# Patient Record
Sex: Male | Born: 1935 | Race: Black or African American | Hispanic: No | Smoking: Former smoker
Health system: Southern US, Community
[De-identification: ages and names within clinical notes are randomized; demographics above are authoritative.]

## PROBLEM LIST (undated history)

## (undated) DIAGNOSIS — I10 Essential (primary) hypertension: Secondary | ICD-10-CM

## (undated) DIAGNOSIS — I4510 Unspecified right bundle-branch block: Secondary | ICD-10-CM

## (undated) DIAGNOSIS — E785 Hyperlipidemia, unspecified: Secondary | ICD-10-CM

## (undated) DIAGNOSIS — M1990 Unspecified osteoarthritis, unspecified site: Secondary | ICD-10-CM

## (undated) DIAGNOSIS — M109 Gout, unspecified: Secondary | ICD-10-CM

## (undated) DIAGNOSIS — K219 Gastro-esophageal reflux disease without esophagitis: Secondary | ICD-10-CM

## (undated) DIAGNOSIS — N183 Chronic kidney disease, stage 3 unspecified: Secondary | ICD-10-CM

## (undated) HISTORY — PX: NO PAST SURGERIES: SHX2092

## (undated) HISTORY — DX: Unspecified osteoarthritis, unspecified site: M19.90

## (undated) HISTORY — DX: Chronic kidney disease, stage 3 unspecified: N18.30

## (undated) HISTORY — DX: Gout, unspecified: M10.9

## (undated) HISTORY — DX: Hyperlipidemia, unspecified: E78.5

## (undated) HISTORY — DX: Chronic kidney disease, stage 3 (moderate): N18.3

## (undated) HISTORY — DX: Unspecified right bundle-branch block: I45.10

---

## 2007-03-08 ENCOUNTER — Ambulatory Visit (HOSPITAL_COMMUNITY): Admission: RE | Admit: 2007-03-08 | Discharge: 2007-03-08 | Payer: Self-pay | Admitting: Gastroenterology

## 2007-03-27 ENCOUNTER — Ambulatory Visit: Payer: Self-pay | Admitting: Gastroenterology

## 2007-03-27 ENCOUNTER — Ambulatory Visit (HOSPITAL_COMMUNITY): Admission: RE | Admit: 2007-03-27 | Discharge: 2007-03-27 | Payer: Self-pay | Admitting: Gastroenterology

## 2007-11-15 ENCOUNTER — Emergency Department (HOSPITAL_COMMUNITY): Admission: EM | Admit: 2007-11-15 | Discharge: 2007-11-15 | Payer: Self-pay | Admitting: Emergency Medicine

## 2008-05-04 ENCOUNTER — Ambulatory Visit: Payer: Self-pay | Admitting: Cardiology

## 2008-05-04 ENCOUNTER — Inpatient Hospital Stay (HOSPITAL_COMMUNITY): Admission: EM | Admit: 2008-05-04 | Discharge: 2008-05-05 | Payer: Self-pay | Admitting: Emergency Medicine

## 2008-05-05 ENCOUNTER — Encounter: Payer: Self-pay | Admitting: Cardiology

## 2009-09-02 ENCOUNTER — Emergency Department (HOSPITAL_COMMUNITY): Admission: EM | Admit: 2009-09-02 | Discharge: 2009-09-02 | Payer: Self-pay | Admitting: Emergency Medicine

## 2010-04-27 LAB — LIPID PANEL
Cholesterol: 202 mg/dL — ABNORMAL HIGH (ref 0–200)
HDL: 25 mg/dL — ABNORMAL LOW (ref >39)
Total CHOL/HDL Ratio: 8.1 RATIO
VLDL: 47 mg/dL — ABNORMAL HIGH (ref 0–40)

## 2010-04-27 LAB — DIFFERENTIAL
Basophils Absolute: 0.1 10*3/uL (ref 0.0–0.1)
Basophils Relative: 1 % (ref 0–1)
Eosinophils Absolute: 0.1 10*3/uL (ref 0.0–0.7)
Eosinophils Relative: 3 % (ref 0–5)
Lymphocytes Relative: 34 % (ref 12–46)
Lymphs Abs: 1.7 10*3/uL (ref 0.7–4.0)
Monocytes Absolute: 0.2 10*3/uL (ref 0.1–1.0)

## 2010-04-27 LAB — POCT CARDIAC MARKERS
CKMB, poc: 1.8 ng/mL (ref 1.0–8.0)
CKMB, poc: 1.9 ng/mL (ref 1.0–8.0)
Myoglobin, poc: 164 ng/mL (ref 12–200)
Myoglobin, poc: 190 ng/mL (ref 12–200)

## 2010-04-27 LAB — COMPREHENSIVE METABOLIC PANEL
AST: 23 U/L (ref 0–37)
Albumin: 3.9 g/dL (ref 3.5–5.2)
Alkaline Phosphatase: 62 U/L (ref 39–117)
Creatinine, Ser: 1.42 mg/dL (ref 0.4–1.5)
GFR calc Af Amer: 60 mL/min — ABNORMAL LOW (ref >60)

## 2010-04-27 LAB — CBC
HCT: 39.4 % (ref 39.0–52.0)
Hemoglobin: 13.7 g/dL (ref 13.0–17.0)
RBC: 4.62 MIL/uL (ref 4.22–5.81)
WBC: 4.9 10*3/uL (ref 4.0–10.5)

## 2010-04-27 LAB — CARDIAC PANEL(CRET KIN+CKTOT+MB+TROPI)
CK, MB: 3.5 ng/mL (ref 0.3–4.0)
Relative Index: 1.7 (ref 0.0–2.5)
Total CK: 174 U/L (ref 7–232)

## 2010-05-31 NOTE — Consult Note (Signed)
NAME:  Scott Burton, Scott Burton NO.:  [id number]   MEDICAL RECORD NO.:  [id number]          PATIENT TYPE:  INP   LOCATION:  A301                          FACILITY:  [facility]   PHYSICIAN:  Gerrit Friends. Dietrich Pates, MD, FACCDATE OF BIRTH:  Jun 03, 1935   DATE OF CONSULTATION:  05/05/2008  DATE OF DISCHARGE:                                 CONSULTATION   REFERRING PHYSICIAN:  Dr. Avon Gully.   REASON FOR CONSULTATION:  Chest pain.   HISTORY OF PRESENT ILLNESS:  Scott Burton is a 75 year old male patient  with no history of CAD who presented to the [facility]  Emergency Room yesterday with complaints of chest discomfort.  Scott Burton  started to note left-sided chest pressure about 3 days ago.  It is  sometimes brought on at mealtime and improved with taking antacids.  Scott Burton  works at a [employer].  Scott Burton had  recurrent chest discomfort yesterday with lifting a bag of groceries.  It lasted for a few minutes and his coworkers eventually urged him to go  to the emergency room for further evaluation.  Scott Burton denies any associated  shortness of breath, radiating symptoms, nausea, diaphoresis, or  syncope.  Scott Burton is very active without any recent history of exertional  chest pain or dyspnea with exertion.   PAST MEDICAL HISTORY:  1. Hypertension.  2. GERD.  3. Questionable history of hyperlipidemia.  4. Diverticulosis.   MEDICATIONS AT HOME:  1. Omeprazole 20 mg daily.  2. Lisinopril/hydrochlorothiazide 10/12.5 mg daily.   ALLERGIES:  NO KNOWN DRUG ALLERGIES.   SOCIAL HISTORY:  The patient lives in [place] with his 2 daughters.  Scott Burton  is a widower.  Scott Burton has a prior history of smoking off and on and quit  about 20 years ago.  Denies alcohol abuse.  Scott Burton works at [employer]  as outlined above about 32 hours a week.   FAMILY HISTORY:  His brother had some type of heart surgery in his 30s  and died after that.  It is not clear that Scott Burton had bypass  surgery.   REVIEW OF SYSTEMS:  Please see the HPI.  Scott Burton denies fevers, chills,  headache, sore throat, rash, dysuria, hematuria, bright red blood per  rectum or melena, dysphagia, GERD symptoms, claudication symptoms,  orthopnea, PND or pedal edema, syncope, near syncope, or cough.  All  other systems reviewed and negative.   PHYSICAL EXAM:  Scott Burton is a well-nourished, well-developed male in no acute  distress.  Blood pressure is 121/68.  Pulse 65.  Respirations 18.  Temperature 98.3.  Oxygen saturation is 96% on room air.  Weight 88.1  kg.  HEENT:  Normal.  NECK:  Without JVD.  LYMPH:  Without lymphadenopathy.  ENDOCRINE:  Without thyromegaly.  CARDIAC:  Normal S1 and S2.  Regular rate and rhythm.  A 2/6 systolic  ejection murmur heard best at right sternal border and left sternal  border with preserved S2.  LUNGS:  Clear to auscultation bilaterally.  No wheezing, rhonchi, or  rales.  SKIN:  Warm and dry.  ABDOMEN:  Soft, nontender with normoactive bowel sounds.  No  organomegaly.  EXTREMITIES:  Without clubbing, cyanosis, or edema.  MUSCULOSKELETAL:  Without joint deformity.  NEUROLOGIC:  Scott Burton is alert and oriented x3.  Cranial nerves II-XII are  grossly intact.  VASCULAR EXAM:  Without carotid bruits bilaterally.  Dorsalis pedis and  posterior tibialis pulses are 2+ bilaterally.   Chest x-ray, no active cardiopulmonary disease.  EKG, sinus rhythm with  a heart rate of 82, left axis deviation, left anterior fascicular block,  left atrial enlargement, nonspecific ST-T wave abnormality laterally,  borderline IVCD, septal Q-waves.   LABS:  Hemoglobin 13.7, potassium 3.4, creatinine 1.42.  Cardiac markers  negative x3.  INR 1.0.   ASSESSMENT:  1. Atypical chest pain in a 75 year old male with a history of      hypertension and questionable history of hyperlipidemia, as well as      remote tobacco abuse.  Myocardial infarction has been ruled out by      enzymes.  Followup EKG is  pending.  His symptoms are atypical and      likely secondary to gastroesophageal reflux disease or      musculoskeletal pain.  2. Gastroesophageal reflux disease.  3. Questionable family history of coronary artery disease.  4. Systolic murmur.  5. Hypokalemia.   RECOMMENDATIONS:  The patient was also interviewed and examined by Dr.  Hamlin Bing.  Prior records have been reviewed.  As noted, Scott Burton does  have atypical chest pain.  Scott Burton does have some characteristics of ischemia  with positive risk factors.  His EKG demonstrates nondiagnostic  abnormalities.  His cardiac markers are negative.  Scott Burton is relatively low  risk for coronary artery disease.  We will assess with an echocardiogram  to look at his cardiac structure and assess his murmur.  Scott Burton will also  undergo a stress echocardiogram.  If it can be done as an inpatient  today, we will proceed today.  Otherwise, it can be done as an  outpatient.  Lipids will be checked.  His potassium will be replaced.  Thank you very much for the consultation.  We will be glad to follow the  patient throughout the remainder of this admission.      Tereso Newcomer, PA-C      Gerrit Friends. Dietrich Pates, MD, [facility]  Electronically Signed    SW/MEDQ  D:  05/05/2008  T:  05/05/2008  Job:  [id number]   cc:   Tesfaye D. Felecia Shelling, MD  Fax: [phone]

## 2010-05-31 NOTE — Op Note (Signed)
NAME:  Scott Burton, Scott Burton NO.:  [id number]   MEDICAL RECORD NO.:  [id number]          PATIENT TYPE:  AMB   LOCATION:  DAY                           FACILITY:  [facility]   PHYSICIAN:  Kassie Mends, M.D.      DATE OF BIRTH:  1935-08-21   DATE OF PROCEDURE:  03/27/2007  DATE OF DISCHARGE:                               OPERATIVE REPORT   REFERRING Luetta Piazza:  Tesfaye D. Felecia Shelling, MD   PROCEDURE:  Colonoscopy.   INDICATION FOR EXAM:  Scott Burton is a 75 year old male who presents for  average risk colon cancer screening.   FINDINGS:  1. Frequent sigmoid diverticula.  Otherwise, no polyps, masses,      inflammatory changes or AVMs seen.  2. Small internal hemorrhoids.  Otherwise normal retroflexed view of      the rectum.   RECOMMENDATIONS:  1. Screening colonoscopy in 10 years.  2. He should follow a high-fiber diet.  He was given a handout on high-      fiber diet and diverticulosis.   MEDICATIONS:  1. Demerol 50 mg IV.  2. Versed 4 mg IV.   PROCEDURE TECHNIQUE:  Physical exam was performed.  Informed consent was  obtained.  The patient was explained the benefits, risks and  alternatives to the procedure.  The patient was connected to the monitor  and placed in the left lateral position.  Continuous oxygen was provided  by nasal cannula.  IV meds were administered through an indwelling  cannula.  After administration of sedation and after rectal exam, the  patient's rectum was intubated  and the scope was advanced under direct visualization to the cecum.  The  scope was removed slowly by carefully examining the color, texture,  anatomy and integrity of the mucosa on the way out.  The patient was  recovered in endoscopy and discharged home in satisfactory condition.      Kassie Mends, M.D.  Electronically Signed     SM/MEDQ  D:  03/27/2007  T:  03/27/2007  Job:  [id number]   cc:   Tesfaye D. Felecia Shelling, MD  Fax: [phone]

## 2010-05-31 NOTE — H&P (Signed)
NAME:  JONNIE, TRUXILLO NO.:  [id number]   MEDICAL RECORD NO.:  [id number]          PATIENT TYPE:  INP   LOCATION:  A301                          FACILITY:  [facility]   PHYSICIAN:  Tesfaye D. Felecia Shelling, MD   DATE OF BIRTH:  1935-11-03   DATE OF ADMISSION:  05/04/2008  DATE OF DISCHARGE:  LH                              HISTORY & PHYSICAL   CHIEF COMPLAINT:  Chest pain.   HISTORY OF PRESENT ILLNESS:  This is a 75 year old male patient with  history of hypertension and gastroesophageal reflux disease, came with a  complaint of left-sided recurrent chest pain.  The pain started about 2-  3 days back.  It lasted for a few minutes.  He had another episode this  morning and the pain was like localized and pressure-type.  There was no  radiation.  No nausea or vomiting.  The patient came to emergency room  and he was evaluated.  His initial cardiac enzymes and EKG were negative  for any acute ischemic changes.  The patient was, however, admitted for  further evaluation and treatment.   REVIEW OF SYSTEMS:  No fever or chills, headache, shortness of breath,  palpitation, nausea, vomiting, abdominal pain, dysuria, urgency or  frequency of urination.   PAST MEDICAL HISTORY:  1. Gastroesophageal reflux disease.  2. Hypertension.   CURRENT MEDICATIONS:  1. Protonix 40 mg daily.  2. Lisinopril/HCTZ 10/12.5 one tablet p.o. daily.   SOCIAL HISTORY:  Patient has no history of alcohol, tobacco or substance  abuse.   PHYSICAL EXAMINATION:  The patient is alert, awake and comfortable.  VITAL SIGNS:  Blood pressure 136/82, pulse 87, respiratory rate 18,  temperature 98 degrees Fahrenheit.  HEENT:  Pupils are equal and reactive.  NECK:  Supple.  CHEST:  Decreased air entry, few rhonchi.  CARDIOVASCULAR SYSTEM:  First and second heart sound heard.  No murmur,  no gallop.  ABDOMEN:  Soft and lax.  Bowel sound is positive.  No mass or  organomegaly.  EXTREMITIES:  No leg edema.   LABORATORY DATA:  Sodium 138, potassium 3.4, chloride 104, carbon  dioxide 27, glucose 93, BUN 14, creatinine 1.4 and calcium 10.0.  CBC:  WBC 4.9, hemoglobin 13.7, hematocrit 39.4 and platelets 181.  Myoglobin  164, troponin less than 0.05, CK-MB 1.9.   ASSESSMENT:  This is a 75 year old male patient with a history of  hypertension and gastroesophageal reflux disease, came with left-sided  chest pain.  His initial EKG and cardiac enzyme is negative for acute  ischemia.   PLAN:  We will do serial EKG, cardiac enzymes.  We will do cardiology  consult.  We will continue the patient on his regular medications.      Tesfaye D. Felecia Shelling, MD  Electronically Signed     TDF/MEDQ  D:  05/04/2008  T:  05/05/2008  Job:  [id number]

## 2010-06-03 NOTE — Discharge Summary (Signed)
NAME:  Scott Burton, Scott Burton NO.:  [id number]   MEDICAL RECORD NO.:  [id number]          PATIENT TYPE:  INP   LOCATION:  A301                          FACILITY:  [facility]   PHYSICIAN:  Tesfaye D. Felecia Shelling, MD   DATE OF BIRTH:  1935-06-19   DATE OF ADMISSION:  05/04/2008  DATE OF DISCHARGE:  04/20/2010LH                               DISCHARGE SUMMARY   DISCHARGE DIAGNOSES:  1. Noncardiac chest pain.  2. Gastroesophageal reflux disease.  3. Hypokalemia.  4. History of hyperlipidemia.  5. Diverticulitis.   DISCHARGE MEDICATIONS:  1. Lisinopril/hydrochlorothiazide 10/12.5 one tablet p.o. daily.  2. Omeprazole 20 mg daily.   DISPOSITION:  The patient was discharged to home in stable condition.   HOSPITAL COURSE:  This is a 75 year old male patient with history of  hypertension and gastroesophageal reflux disease, who was brought to  emergency room due to left-sided chest pain.  The pain started about 2  or 3 days before admission.  It lasted for few minutes.  There was no  associated symptoms.  The patient was admitted and had a serial EKG and  cardiac enzymes.  The test was overall negative.  He was also evaluated  by Cardiology and stress test was done, which was found to be normal.  The patient was discharged to home in stable condition to continue  followup in outpatient.      Tesfaye D. Felecia Shelling, MD  Electronically Signed     TDF/MEDQ  D:  05/14/2008  T:  05/14/2008  Job:  [id number]

## 2010-10-18 LAB — DIFFERENTIAL
Basophils Absolute: 0.1
Eosinophils Relative: 3
Lymphs Abs: 2.1
Monocytes Absolute: 0.4
Neutrophils Relative %: 65

## 2010-10-18 LAB — URINALYSIS, ROUTINE W REFLEX MICROSCOPIC
Bilirubin Urine: NEGATIVE
Glucose, UA: NEGATIVE
Hgb urine dipstick: NEGATIVE
Ketones, ur: NEGATIVE
Nitrite: NEGATIVE
Protein, ur: NEGATIVE
Specific Gravity, Urine: >1.03 — ABNORMAL HIGH
Urobilinogen, UA: 0.2
pH: 6

## 2010-10-18 LAB — COMPREHENSIVE METABOLIC PANEL WITH GFR
ALT: 14
AST: 18
Albumin: 3.8
Alkaline Phosphatase: 66
BUN: 14
CO2: 26
Calcium: 10
Chloride: 110
Creatinine, Ser: 1.43
GFR calc Af Amer: 59 — ABNORMAL LOW
GFR calc non Af Amer: 49 — ABNORMAL LOW
Glucose, Bld: 105 — ABNORMAL HIGH
Potassium: 3.9
Sodium: 140
Total Bilirubin: 0.6
Total Protein: 6.8

## 2010-10-18 LAB — URINE MICROSCOPIC-ADD ON

## 2010-10-18 LAB — CBC
Hemoglobin: 13.6
MCHC: 33.7
MCV: 85.9
Platelets: 200
RBC: 4.69

## 2010-10-18 LAB — B-NATRIURETIC PEPTIDE (CONVERTED LAB): Pro B Natriuretic peptide (BNP): 44.9

## 2011-01-28 ENCOUNTER — Encounter (HOSPITAL_COMMUNITY): Payer: Self-pay | Admitting: Emergency Medicine

## 2011-01-28 ENCOUNTER — Emergency Department (HOSPITAL_COMMUNITY)
Admission: EM | Admit: 2011-01-28 | Discharge: 2011-01-28 | Disposition: A | Discharge status: Home / Self Care | Payer: Medicare Other | Attending: Emergency Medicine | Admitting: Emergency Medicine

## 2011-01-28 DIAGNOSIS — H6091 Unspecified otitis externa, right ear: Secondary | ICD-10-CM

## 2011-01-28 DIAGNOSIS — I10 Essential (primary) hypertension: Secondary | ICD-10-CM | POA: Insufficient documentation

## 2011-01-28 DIAGNOSIS — K219 Gastro-esophageal reflux disease without esophagitis: Secondary | ICD-10-CM | POA: Insufficient documentation

## 2011-01-28 DIAGNOSIS — H938X9 Other specified disorders of ear, unspecified ear: Secondary | ICD-10-CM | POA: Insufficient documentation

## 2011-01-28 DIAGNOSIS — H9210 Otorrhea, unspecified ear: Secondary | ICD-10-CM | POA: Insufficient documentation

## 2011-01-28 DIAGNOSIS — H9209 Otalgia, unspecified ear: Secondary | ICD-10-CM | POA: Insufficient documentation

## 2011-01-28 DIAGNOSIS — H9319 Tinnitus, unspecified ear: Secondary | ICD-10-CM | POA: Insufficient documentation

## 2011-01-28 DIAGNOSIS — H60399 Other infective otitis externa, unspecified ear: Secondary | ICD-10-CM | POA: Insufficient documentation

## 2011-01-28 HISTORY — DX: Essential (primary) hypertension: I10

## 2011-01-28 HISTORY — DX: Gastro-esophageal reflux disease without esophagitis: K21.9

## 2011-01-28 MED ORDER — OFLOXACIN 0.3 % OT SOLN: 3.0000 [drp] | Freq: Two times a day (BID) | OTIC | Status: AC

## 2011-01-28 NOTE — ED Provider Notes (Signed)
History    75yM with R ear pain and drainage. Gradual onset about a week ago. Persistent. Yellowish drainage. Past few days has had sensation of ear fullness and tinnitus. Feels buzzing sensation in ear. No neck pain or sore throat. No fever or chills. No Ha. Denies use of cotton swabs to clean ears. Denies hx of diabetes.  CSN: [id number]  Arrival date & time 01/28/11  1325   First MD Initiated Contact with Patient 01/28/11 1506      Chief Complaint  Patient presents with  . Otalgia  . Tinnitus    (Consider location/radiation/quality/duration/timing/severity/associated sxs/prior treatment) HPI  Past Medical History  Diagnosis Date  . Hypertension   . GERD (gastroesophageal reflux disease)     History reviewed. No pertinent past surgical history.  Family History  Problem Relation Age of Onset  . Cancer Father     History  Substance Use Topics  . Smoking status: Never Smoker   . Smokeless tobacco: Former Neurosurgeon    Quit date: 01/28/1991  . Alcohol Use: No      Review of Systems   Review of symptoms negative unless otherwise noted in HPI.   Allergies  Review of patient's allergies indicates no known allergies.  Home Medications  No current outpatient prescriptions on file.  BP 118/69  Pulse 87  Temp(Src) 98.5 F (36.9 C) (Oral)  Resp 20  Ht 5\' 6"  (1.676 m)  Wt 180 lb (81.647 kg)  BMI 29.05 kg/m2  SpO2 100%  Physical Exam  Nursing note and vitals reviewed. Constitutional: He appears well-developed and well-nourished. No distress.  HENT:  Head: Normocephalic and atraumatic.  Left Ear: External ear normal.  Nose: Nose normal.  Mouth/Throat: Oropharynx is clear and moist. No oropharyngeal exudate.       Yellowish discharge from R ear. Some pain with manipulation. Mild swelling of ext aud canal but not significant enough to need ear wick. TM only partially visualized because of debris. Visualized portion intact. Mastoid region non tender and no significant  overlying skin changes.  Eyes: Conjunctivae are normal. Pupils are equal, round, and reactive to light. Right eye exhibits no discharge. Left eye exhibits no discharge.  Neck: Neck supple.  Cardiovascular: Normal rate, regular rhythm and normal heart sounds.  Exam reveals no gallop and no friction rub.   No murmur heard. Pulmonary/Chest: Effort normal and breath sounds normal. No respiratory distress.  Abdominal: Soft.  Musculoskeletal: He exhibits no edema and no tenderness.  Neurological: He is alert.  Skin: Skin is warm and dry.  Psychiatric: He has a normal mood and affect. His behavior is normal. Thought content normal.    ED Course  Procedures (including critical care time)  Labs Reviewed - No data to display No results found.   1. Otitis externa of right ear       MDM  75ym with R ear pain and drainage. Exam consistent with otitis externa. No hx of diabetes. No mastoid tenderness or skin changes. Exam otherwise unremarkable. Plan tx for otitis externa. Outpt fu.        Raeford Razor, MD 01/28/11 1527

## 2011-01-28 NOTE — ED Notes (Signed)
Patient c/o right ear pain with ringing in ear x1 week. Per patient occasional yellow drainage noted.

## 2011-05-04 ENCOUNTER — Ambulatory Visit (INDEPENDENT_AMBULATORY_CARE_PROVIDER_SITE_OTHER): Payer: Medicare Other | Admitting: Otolaryngology

## 2011-05-04 DIAGNOSIS — H9319 Tinnitus, unspecified ear: Secondary | ICD-10-CM

## 2011-05-04 DIAGNOSIS — H903 Sensorineural hearing loss, bilateral: Secondary | ICD-10-CM

## 2012-05-02 ENCOUNTER — Ambulatory Visit (INDEPENDENT_AMBULATORY_CARE_PROVIDER_SITE_OTHER): Payer: Medicare Other | Admitting: Otolaryngology

## 2012-08-17 ENCOUNTER — Emergency Department (HOSPITAL_COMMUNITY)
Admission: EM | Admit: 2012-08-17 | Discharge: 2012-08-17 | Disposition: A | Discharge status: Home / Self Care | Payer: Medicare Other | Attending: Emergency Medicine | Admitting: Emergency Medicine

## 2012-08-17 ENCOUNTER — Encounter (HOSPITAL_COMMUNITY): Payer: Self-pay | Admitting: Emergency Medicine

## 2012-08-17 DIAGNOSIS — L539 Erythematous condition, unspecified: Secondary | ICD-10-CM | POA: Insufficient documentation

## 2012-08-17 DIAGNOSIS — Y929 Unspecified place or not applicable: Secondary | ICD-10-CM | POA: Insufficient documentation

## 2012-08-17 DIAGNOSIS — K219 Gastro-esophageal reflux disease without esophagitis: Secondary | ICD-10-CM | POA: Insufficient documentation

## 2012-08-17 DIAGNOSIS — Y939 Activity, unspecified: Secondary | ICD-10-CM | POA: Insufficient documentation

## 2012-08-17 DIAGNOSIS — I10 Essential (primary) hypertension: Secondary | ICD-10-CM | POA: Insufficient documentation

## 2012-08-17 DIAGNOSIS — T63461A Toxic effect of venom of wasps, accidental (unintentional), initial encounter: Secondary | ICD-10-CM | POA: Insufficient documentation

## 2012-08-17 DIAGNOSIS — H5710 Ocular pain, unspecified eye: Secondary | ICD-10-CM | POA: Insufficient documentation

## 2012-08-17 DIAGNOSIS — T6391XA Toxic effect of contact with unspecified venomous animal, accidental (unintentional), initial encounter: Secondary | ICD-10-CM | POA: Insufficient documentation

## 2012-08-17 DIAGNOSIS — R220 Localized swelling, mass and lump, head: Secondary | ICD-10-CM | POA: Insufficient documentation

## 2012-08-17 MED ORDER — DIPHENHYDRAMINE HCL 25 MG PO CAPS
25.0000 mg | ORAL_CAPSULE | Freq: Once | ORAL | Status: AC
  Administered 2012-08-17: 25 mg via ORAL
  Filled 2012-08-17: qty 1

## 2012-08-17 MED ORDER — DIPHENHYDRAMINE HCL 25 MG PO TABS: 25.0000 mg | ORAL_TABLET | Freq: Three times a day (TID) | ORAL | Status: DC | PRN

## 2012-08-17 NOTE — ED Provider Notes (Signed)
CSN: [id number]     Arrival date & time 08/17/12  1632 History     First MD Initiated Contact with Patient 08/17/12 1649     Chief Complaint  Patient presents with  . Insect Bite   (Consider location/radiation/quality/duration/timing/severity/associated sxs/prior Treatment) HPI Pt was stung by bees multiple times this morning around 8 am. Pt was stung on R face, R posterior neck and elbows. He has very mild pain at sites, complains of itching at sites. Pt has mild R periorbital swelling. No intraoral swelling. No stridor or wheezing. No SOB. No rash, nausea or vomiting.  Past Medical History  Diagnosis Date  . Hypertension   . GERD (gastroesophageal reflux disease)    History reviewed. No pertinent past surgical history. Family History  Problem Relation Age of Onset  . Cancer Father    History  Substance Use Topics  . Smoking status: Never Smoker   . Smokeless tobacco: Former Neurosurgeon    Quit date: 01/28/1991  . Alcohol Use: No    Review of Systems  Constitutional: Negative for fever and chills.  HENT: Positive for facial swelling. Negative for sore throat, trouble swallowing and neck pain.   Respiratory: Negative for cough, shortness of breath, wheezing and stridor.   Cardiovascular: Negative for chest pain, palpitations and leg swelling.  Gastrointestinal: Negative for nausea, vomiting, abdominal pain, diarrhea and constipation.  Musculoskeletal: Negative for back pain.  Skin: Negative for pallor, rash and wound.  Neurological: Negative for dizziness, weakness, light-headedness, numbness and headaches.  All other systems reviewed and are negative.    Allergies  Review of patient's allergies indicates no known allergies.  Home Medications   Current Outpatient Rx  Name  Route  Sig  Dispense  Refill  . diphenhydrAMINE (BENADRYL) 25 MG tablet   Oral   Take 1 tablet (25 mg total) by mouth every 8 (eight) hours as needed for itching.   20 tablet   0    BP 98/70   Pulse 80  Temp(Src) 98.4 F (36.9 C) (Oral)  Resp 18  Ht 5\' 6"  (1.676 m)  Wt 189 lb (85.73 kg)  BMI 30.52 kg/m2  SpO2 99% Physical Exam  Nursing note and vitals reviewed. Constitutional: He is oriented to person, place, and time. He appears well-developed and well-nourished. No distress.  HENT:  Head: Normocephalic and atraumatic.  Mouth/Throat: Oropharynx is clear and moist.  Very mild R periorbital swelling  Eyes: EOM are normal. Pupils are equal, round, and reactive to light.  Neck: Normal range of motion. Neck supple.  Cardiovascular: Normal rate and regular rhythm.   Pulmonary/Chest: Effort normal and breath sounds normal. No stridor. No respiratory distress. He has no wheezes. He has no rales.  Abdominal: Soft. Bowel sounds are normal. He exhibits no distension and no mass. There is no tenderness. There is no rebound and no guarding.  Musculoskeletal: Normal range of motion. He exhibits no edema and no tenderness.  Neurological: He is alert and oriented to person, place, and time.  Skin: Skin is warm and dry. No rash noted. There is erythema.  Small non-raised erythematous areas on R face, poster neck and arms consistent with insect sting  Psychiatric: He has a normal mood and affect. His behavior is normal.    ED Course   Procedures (including critical care time)  Labs Reviewed - No data to display No results found. 1. Bee sting, initial encounter     MDM  Well appearing. No anaphylaxis. Will treat with benadryl. Return  precautions given.   Loren Racer, MD 08/17/12 [phone]

## 2012-08-17 NOTE — ED Notes (Signed)
Pt c/o multiple bee stings-right eye, bilateral elbows, back of head and abdomen. Pt c/o pain/swelling to areas-worse in right eye.

## 2012-08-17 NOTE — ED Notes (Signed)
C/o multiple stings by yellow jackets that occurred this morning at 0800 to face and neck, denies any SOB or difficulty swallowing, took some unknown brown pill earlier and was told that would keep him from itching, no acute distress noted at this time

## 2013-04-15 ENCOUNTER — Telehealth: Payer: Self-pay | Admitting: *Deleted

## 2013-04-15 NOTE — Telephone Encounter (Signed)
Pt's daughter "Pamala Hurry" called to schedule pt a colonoscopy, Pamala Hurry stated they got a letter in the mail stating he needs to schedule a colonoscopy. Please advise [phone]

## 2013-04-16 ENCOUNTER — Telehealth: Payer: Self-pay

## 2013-04-16 NOTE — Telephone Encounter (Signed)
Pt's daughter, Pamala Hurry, has called back again today to see about getting her father set up for a tcs. I told her that DS was aware and would be calling her back. She gave another number to call [phone]

## 2013-04-16 NOTE — Telephone Encounter (Addendum)
REVIEWED. AGREE. NEXT TCS IN 2019 IF THE BENEFITS OUTWEIGH THE RISKS. PLEASE INSTRUCT PT TO CALL FOR TCS SOONER IF HE HAS LOW BLOOD COUNT, rectal bleedING OR CHANGE IN HIS BOWEL HABITS. HE WOULD NEED A TCS SOONER THAN 2019.

## 2013-04-16 NOTE — Telephone Encounter (Signed)
I called [phone] and spoke to Urbano Heir one of the daughters and also called Pamala Hurry at [phone]. Both of them said their father is not having any problems, no rectal bleed, no hemorrhoids, no constipation or diarrhea. He has no family hx of colon cancer.   His last colonoscopy was done 03/27/2007 by Dr.Manus Oneida Alar). She recommended his next one be done in 10 years. He will be due next in 03/2017.  They are aware and will call if any problems. He is on our recall for 2019.

## 2013-04-17 NOTE — Telephone Encounter (Signed)
Daughters are aware pt will need TCS sooner if he has problems with rectal bleed or anemia.

## 2013-04-17 NOTE — Telephone Encounter (Signed)
See separate phone note.

## 2014-02-04 DIAGNOSIS — K219 Gastro-esophageal reflux disease without esophagitis: Secondary | ICD-10-CM | POA: Diagnosis not present

## 2014-02-04 DIAGNOSIS — Z0001 Encounter for general adult medical examination with abnormal findings: Secondary | ICD-10-CM | POA: Diagnosis not present

## 2014-02-04 DIAGNOSIS — I10 Essential (primary) hypertension: Secondary | ICD-10-CM | POA: Diagnosis not present

## 2014-02-04 DIAGNOSIS — E784 Other hyperlipidemia: Secondary | ICD-10-CM | POA: Diagnosis not present

## 2014-05-06 DIAGNOSIS — G4700 Insomnia, unspecified: Secondary | ICD-10-CM | POA: Diagnosis not present

## 2014-05-06 DIAGNOSIS — K219 Gastro-esophageal reflux disease without esophagitis: Secondary | ICD-10-CM | POA: Diagnosis not present

## 2014-05-06 DIAGNOSIS — I10 Essential (primary) hypertension: Secondary | ICD-10-CM | POA: Diagnosis not present

## 2014-07-29 DIAGNOSIS — K219 Gastro-esophageal reflux disease without esophagitis: Secondary | ICD-10-CM | POA: Diagnosis not present

## 2014-07-29 DIAGNOSIS — I10 Essential (primary) hypertension: Secondary | ICD-10-CM | POA: Diagnosis not present

## 2014-07-29 DIAGNOSIS — M109 Gout, unspecified: Secondary | ICD-10-CM | POA: Diagnosis not present

## 2014-08-06 ENCOUNTER — Encounter (HOSPITAL_COMMUNITY): Payer: Self-pay | Admitting: Emergency Medicine

## 2014-08-06 ENCOUNTER — Emergency Department (HOSPITAL_COMMUNITY): Payer: Commercial Managed Care - HMO

## 2014-08-06 ENCOUNTER — Emergency Department (HOSPITAL_COMMUNITY)
Admission: EM | Admit: 2014-08-06 | Discharge: 2014-08-06 | Disposition: A | Discharge status: Home / Self Care | Payer: Commercial Managed Care - HMO | Attending: Physician Assistant | Admitting: Physician Assistant

## 2014-08-06 DIAGNOSIS — I951 Orthostatic hypotension: Secondary | ICD-10-CM | POA: Diagnosis not present

## 2014-08-06 DIAGNOSIS — Z79899 Other long term (current) drug therapy: Secondary | ICD-10-CM | POA: Insufficient documentation

## 2014-08-06 DIAGNOSIS — R410 Disorientation, unspecified: Secondary | ICD-10-CM | POA: Diagnosis not present

## 2014-08-06 DIAGNOSIS — I10 Essential (primary) hypertension: Secondary | ICD-10-CM | POA: Insufficient documentation

## 2014-08-06 DIAGNOSIS — H9190 Unspecified hearing loss, unspecified ear: Secondary | ICD-10-CM | POA: Diagnosis not present

## 2014-08-06 DIAGNOSIS — Z8719 Personal history of other diseases of the digestive system: Secondary | ICD-10-CM | POA: Diagnosis not present

## 2014-08-06 DIAGNOSIS — R42 Dizziness and giddiness: Secondary | ICD-10-CM | POA: Diagnosis not present

## 2014-08-06 LAB — CBC WITH DIFFERENTIAL/PLATELET
Basophils Absolute: 0.1 10*3/uL (ref 0.0–0.1)
Basophils Relative: 1 % (ref 0–1)
EOS ABS: 0.2 10*3/uL (ref 0.0–0.7)
Eosinophils Relative: 3 % (ref 0–5)
HEMATOCRIT: 40.8 % (ref 39.0–52.0)
HEMOGLOBIN: 13.5 g/dL (ref 13.0–17.0)
Lymphocytes Relative: 39 % (ref 12–46)
Lymphs Abs: 2.5 10*3/uL (ref 0.7–4.0)
MCH: 28.7 pg (ref 26.0–34.0)
MCHC: 33.1 g/dL (ref 30.0–36.0)
MCV: 86.6 fL (ref 78.0–100.0)
MONO ABS: 0.4 10*3/uL (ref 0.1–1.0)
Monocytes Relative: 6 % (ref 3–12)
Neutro Abs: 3.3 10*3/uL (ref 1.7–7.7)
Neutrophils Relative %: 51 % (ref 43–77)
Platelets: 207 10*3/uL (ref 150–400)
RBC: 4.71 MIL/uL (ref 4.22–5.81)
RDW: 13.2 % (ref 11.5–15.5)
WBC: 6.5 10*3/uL (ref 4.0–10.5)

## 2014-08-06 LAB — BASIC METABOLIC PANEL
Anion gap: 7 (ref 5–15)
BUN: 24 mg/dL — AB (ref 6–20)
CHLORIDE: 104 mmol/L (ref 101–111)
CO2: 27 mmol/L (ref 22–32)
Calcium: 10 mg/dL (ref 8.9–10.3)
Creatinine, Ser: 1.69 mg/dL — ABNORMAL HIGH (ref 0.61–1.24)
GFR calc Af Amer: 44 mL/min — ABNORMAL LOW (ref >60)
GFR calc non Af Amer: 38 mL/min — ABNORMAL LOW (ref >60)
GLUCOSE: 95 mg/dL (ref 65–99)
Potassium: 3.6 mmol/L (ref 3.5–5.1)
SODIUM: 138 mmol/L (ref 135–145)

## 2014-08-06 LAB — TROPONIN I: Troponin I: <0.03 ng/mL (ref <0.031)

## 2014-08-06 NOTE — ED Notes (Signed)
MD Mackuen at bedside updating patient and family.

## 2014-08-06 NOTE — ED Provider Notes (Signed)
CSN: [id number]     Arrival date & time 08/06/14  1331 History   First MD Initiated Contact with Patient 08/06/14 1439     Chief Complaint  Patient presents with  . Dizziness     (Consider location/radiation/quality/duration/timing/severity/associated sxs/prior Treatment) HPI Comments: Patient is a 79 year old male with history of hypertension presenting with dizziness over the course of last month. Patient is happens when he stands up quickly from sitting down. And then it resolves after standing for a couple minutes. Not a smoker no hyperlipidemia. Patient feels in his normal state of health now. Patient was brought here because his son and granddaughter were unhappy with the workup for dizziness that has been done as an outpatient.  Patient is a 79 y.o. male presenting with dizziness.  Dizziness Associated symptoms: no chest pain, no hearing loss and no shortness of breath     Past Medical History  Diagnosis Date  . Hypertension   . GERD (gastroesophageal reflux disease)    History reviewed. No pertinent past surgical history. Family History  Problem Relation Age of Onset  . Cancer Father    History  Substance Use Topics  . Smoking status: Never Smoker   . Smokeless tobacco: Former Systems developer    Quit date: 01/28/1991  . Alcohol Use: No    Review of Systems  Constitutional: Negative for fever and activity change.  HENT: Negative for drooling and hearing loss.   Eyes: Negative for discharge and redness.  Respiratory: Negative for cough and shortness of breath.   Cardiovascular: Negative for chest pain.  Gastrointestinal: Negative for abdominal pain.  Genitourinary: Negative for dysuria and urgency.  Musculoskeletal: Negative for arthralgias.  Allergic/Immunologic: Negative for immunocompromised state.  Neurological: Positive for dizziness. Negative for seizures and speech difficulty.  Psychiatric/Behavioral: Negative for behavioral problems and agitation.  All other systems  reviewed and are negative.     Allergies  Review of patient's allergies indicates no known allergies.  Home Medications   Prior to Admission medications   Medication Sig Start Date End Date Taking? Authorizing Provider  losartan-hydrochlorothiazide (HYZAAR) 50-12.5 MG per tablet Take 1 tablet by mouth daily. 08/01/14  Yes Historical Provider, MD  diphenhydrAMINE (BENADRYL) 25 MG tablet Take 1 tablet (25 mg total) by mouth every 8 (eight) hours as needed for itching. Patient not taking: Reported on 08/06/2014 08/17/12   Julianne Rice, MD   BP 129/58 mmHg  Pulse 89  Temp(Src) 99 F (37.2 C) (Oral)  Resp 18  Ht 5\' 6"  (1.676 m)  Wt 189 lb (85.73 kg)  BMI 30.52 kg/m2  SpO2 100% Physical Exam  Constitutional: He is oriented to person, place, and time. He appears well-nourished.  HENT:  Head: Normocephalic.  Mouth/Throat: Oropharynx is clear and moist.  Eyes: Conjunctivae are normal.  Neck: No tracheal deviation present.  Cardiovascular: Normal rate.   Pulmonary/Chest: Effort normal. No stridor. No respiratory distress.  Abdominal: Soft. There is no tenderness. There is no guarding.  Musculoskeletal: Normal range of motion. He exhibits no edema.  Neurological: He is alert and oriented to person, place, and time. No cranial nerve deficit. Coordination normal.  Skin: Skin is warm and dry. No rash noted. He is not diaphoretic.  Psychiatric: He has a normal mood and affect. His behavior is normal.  Nursing note and vitals reviewed.   ED Course  Procedures (including critical care time) Labs Review Labs Reviewed  TROPONIN I  CBC WITH DIFFERENTIAL/PLATELET  BASIC METABOLIC PANEL    Imaging Review No results  found.   EKG Interpretation   Date/Time:  Thursday August 06 2014 15:43:37 EDT Ventricular Rate:  75 PR Interval:  153 QRS Duration: 105 QT Interval:  397 QTC Calculation: 443 R Axis:   -70 Text Interpretation:  Sinus rhythm Left anterior fascicular block Consider   right ventricular hypertrophy no acute ischemia Confirmed by Gerald Leitz ([id number]) on 08/06/2014 3:50:28 PM      MDM   Final diagnoses:  None    Patient is 79 year old male with history of hypertension presenting today with dizziness for the last month. Patient is happens occasionally mostly when standing up from sitting. Patient has had brought this attention PCP who felt that it was orthostatic hypotension due to dehydration. Patient's family was interested in having a more thorough workup and is planning on changing PCPs in order to achieve that. Patient is at baseline right now. He has no neuro logical deficits. He is able to walk a straight line comfortably. His cranial nerves II through XII are intact. Posteriorly neurologically intact. We will get a CAT scan because he had a question of a fall today. In addition we will get CBC Chem-7 and get a troponin. Orthostatic vital signs. Most likely will be able to discharge patient home given his normal physical exam vital signs and lack of symptoms at this time.  Patient is comfortable, ambulatory, and taking PO at time of discharge.  Patient expressed understanding about return precautions.    Mirabel Ahlgren Julio Alm, MD 08/06/14 [phone]

## 2014-08-06 NOTE — Discharge Instructions (Signed)
We are unsure what caused her dizziness today. It is likely from getting up too quickly. We will need you  Near-Syncope Near-syncope (commonly known as near fainting) is sudden weakness, dizziness, or feeling like you might pass out. During an episode of near-syncope, you may also develop pale skin, have tunnel vision, or feel sick to your stomach (nauseous). Near-syncope may occur when getting up after sitting or while standing for a long time. It is caused by a sudden decrease in blood flow to the brain. This decrease can result from various causes or triggers, most of which are not serious. However, because near-syncope can sometimes be a sign of something serious, a medical evaluation is required. The specific cause is often not determined. HOME CARE INSTRUCTIONS  Monitor your condition for any changes. The following actions may help to alleviate any discomfort you are experiencing:  Have someone stay with you until you feel stable.  Lie down right away and prop your feet up if you start feeling like you might faint. Breathe deeply and steadily. Wait until all the symptoms have passed. Most of these episodes last only a few minutes. You may feel tired for several hours.   Drink enough fluids to keep your urine clear or pale yellow.   If you are taking blood pressure or heart medicine, get up slowly when seated or lying down. Take several minutes to sit and then stand. This can reduce dizziness.  Follow up with your health care provider as directed. SEEK IMMEDIATE MEDICAL CARE IF:   You have a severe headache.   You have unusual pain in the chest, abdomen, or back.   You are bleeding from the mouth or rectum, or you have black or tarry stool.   You have an irregular or very fast heartbeat.   You have repeated fainting or have seizure-like jerking during an episode.   You faint when sitting or lying down.   You have confusion.   You have difficulty walking.   You have  severe weakness.   You have vision problems.  MAKE SURE YOU:   Understand these instructions.  Will watch your condition.  Will get help right away if you are not doing well or get worse. Document Released: 01/02/2005 Document Revised: 01/07/2013 Document Reviewed: 06/07/2012 [facility] Patient Information 2015 [place], [place]. This information is not intended to replace advice given to you by your health care provider. Make sure you discuss any questions you have with your health care provider. to continue to get up slowly if possible. Again we will follow up with your primary care doctor he may need to do additional imaging or have you see a neurologist for this intermittent dizziness. Please return if the dizziness comes and will not go away or if you have any additional concerns.

## 2014-08-06 NOTE — ED Notes (Addendum)
MD Mackuen at bedside.  

## 2014-08-06 NOTE — ED Notes (Signed)
Pt family reports pt has had dizziness, confusion, changes in vision, intermittent slurred speech, hearing loss and chest discomfort x1 month. Pt alert. Mild confusion noted in triage. nad noted. Pt denies cp at this time.

## 2014-09-25 ENCOUNTER — Ambulatory Visit (INDEPENDENT_AMBULATORY_CARE_PROVIDER_SITE_OTHER): Payer: Commercial Managed Care - HMO | Admitting: Family Medicine

## 2014-09-25 ENCOUNTER — Encounter: Payer: Self-pay | Admitting: Family Medicine

## 2014-09-25 VITALS — BP 132/70 | HR 76 | Temp 98.1°F | Resp 18 | Ht 66.0 in | Wt 194.0 lb

## 2014-09-25 DIAGNOSIS — Z23 Encounter for immunization: Secondary | ICD-10-CM | POA: Diagnosis not present

## 2014-09-25 DIAGNOSIS — I10 Essential (primary) hypertension: Secondary | ICD-10-CM | POA: Diagnosis not present

## 2014-09-25 DIAGNOSIS — G4700 Insomnia, unspecified: Secondary | ICD-10-CM | POA: Diagnosis not present

## 2014-09-25 DIAGNOSIS — M1712 Unilateral primary osteoarthritis, left knee: Secondary | ICD-10-CM

## 2014-09-25 MED ORDER — NAPROXEN 250 MG PO TABS: 250.0000 mg | ORAL_TABLET | Freq: Two times a day (BID) | ORAL | Status: DC

## 2014-09-25 NOTE — Addendum Note (Signed)
Addended by: Vic Blackbird F on: 09/25/2014 02:08 PM   Modules accepted: Level of Service

## 2014-09-25 NOTE — Patient Instructions (Addendum)
Opthothalmology Referral Flu shot given Release of records - Dr Legrand Rams Get the labs done in [place]  Melatonin 1mg  at bedtime  Naproxen with food for knee and use ice  F/U 3 months

## 2014-09-25 NOTE — Assessment & Plan Note (Signed)
Well controlled,  Will obtain records from his previous doctor. He will get fasting labs done next week. Flu shot was given today.

## 2014-09-25 NOTE — Progress Notes (Signed)
Patient ID: Scott Burton, male   DOB: 11-Sep-1935, 79 y.o.   MRN: [id number]   Subjective:    Patient ID: Scott Burton, male    DOB: [birth year], 79 y.o.   MRN: [id number]  Patient presents for [facility]  issue here to establish care. He is here today with his granddaughter. He was previously being seen by Dr. Legrand Rams. His only history of hypertension and gout which he had earlier this year. He is taking one medication which is his blood pressure medication. He does complain of left knee pain for the past 2 weeks he states that he continues to push mow his lawn and he is very active but he has some soreness in the knee he has not noticed any swelling. He did take 1 ibuprofen tablet. At the end of the visit his granddaughter states that since her mother died a few months ago he has been having difficulty sleeping and often sits off by himself and is not as talkative. She thinks that he may be a little depressed from her passing but they're more concerned with his sleep. They will like to try something natural to help his sleep.   History reviewed. He states that he has had his pneumonia shot and shingles shot he states he has had a colonoscopy in the past I do not have any records today    Review Of Systems:  GEN- denies fatigue, fever, weight loss,weakness, recent illness HEENT- denies eye drainage, change in vision, nasal discharge, CVS- denies chest pain, palpitations RESP- denies SOB, cough, wheeze ABD- denies N/V, change in stools, abd pain GU- denies dysuria, hematuria, dribbling, incontinence MSK-+ joint pain, muscle aches, injury Neuro- denies headache, dizziness, syncope, seizure activity       Objective:    BP 132/70 mmHg  Pulse 76  Temp(Src) 98.1 F (36.7 C) (Oral)  Resp 18  Ht 5\' 6"  (1.676 m)  Wt 194 lb (87.998 kg)  BMI 31.33 kg/m2 GEN- NAD, alert and oriented x3 HEENT- PERRL, EOMI, non injected sclera, pink conjunctiva, MMM, oropharynx clear,arcus  senilis, small pupils Neck- Supple, no thyromegaly CVS- RRR, no murmur RESP-CTAB ABD-NABS,soft,NT,ND Psych- normal affect and mood MSK bilat knee- fair ROM, normal inspection right knee, very small effusion noted inferior/medial aspect of left knee, crepitus bilat Psych- very pleasant, smiling, not anxious or depressed appearing EXT- No edema Pulses- Radial, 2+        Assessment & Plan:      Problem List Items Addressed This Visit    Insomnia     It seems he is still experiencing some grief from the passing of his daughter he is also not sleeping well. We will try him on over-the-counter melatonin to see if this helps with his sleep if not I would consider using trazodone 25 mg.      Essential hypertension, benign    Well controlled,  Will obtain records from his previous doctor. He will get fasting labs done next week. Flu shot was given today.      Relevant Orders   CBC with Differential/Platelet   Comprehensive metabolic panel   Lipid panel    Other Visit Diagnoses    Primary osteoarthritis of left knee    -  Primary     based on examination at the this is osteoarthritis. Naprosyn 250 mg  twice a day for the next 2 weeks he is not having any significant pain, hold on imaging    Relevant Medications  naproxen (NAPROSYN) 250 MG tablet    Need for prophylactic vaccination and inoculation against influenza        Relevant Orders    Flu Vaccine QUAD 36+ mos IM (Fluarix & Fluzone Quad PF (Completed)       Note: This dictation was prepared with Dragon dictation along with smaller phrase technology. Any transcriptional errors that result from this process are unintentional.

## 2014-09-25 NOTE — Assessment & Plan Note (Signed)
It seems he is still experiencing some grief from the passing of his daughter he is also not sleeping well. We will try him on over-the-counter melatonin to see if this helps with his sleep if not I would consider using trazodone 25 mg.

## 2014-09-28 DIAGNOSIS — I10 Essential (primary) hypertension: Secondary | ICD-10-CM | POA: Diagnosis not present

## 2014-09-28 LAB — LIPID PANEL
CHOL/HDL RATIO: 7.7 ratio — AB (ref <=5.0)
Cholesterol: 247 mg/dL — ABNORMAL HIGH (ref 125–200)
HDL: 32 mg/dL — AB (ref >=40)
LDL Cholesterol: 165 mg/dL — ABNORMAL HIGH (ref <130)
TRIGLYCERIDES: 251 mg/dL — AB (ref <150)
VLDL: 50 mg/dL — ABNORMAL HIGH (ref <30)

## 2014-09-28 LAB — CBC WITH DIFFERENTIAL/PLATELET
Basophils Absolute: 0.1 10*3/uL (ref 0.0–0.1)
Basophils Relative: 1 % (ref 0–1)
EOS PCT: 4 % (ref 0–5)
Eosinophils Absolute: 0.2 10*3/uL (ref 0.0–0.7)
HEMATOCRIT: 41.7 % (ref 39.0–52.0)
HEMOGLOBIN: 14.1 g/dL (ref 13.0–17.0)
LYMPHS ABS: 2.3 10*3/uL (ref 0.7–4.0)
LYMPHS PCT: 45 % (ref 12–46)
MCH: 28.9 pg (ref 26.0–34.0)
MCHC: 33.8 g/dL (ref 30.0–36.0)
MCV: 85.5 fL (ref 78.0–100.0)
MPV: 11.8 fL (ref 8.6–12.4)
Monocytes Absolute: 0.2 10*3/uL (ref 0.1–1.0)
Monocytes Relative: 4 % (ref 3–12)
NEUTROS ABS: 2.3 10*3/uL (ref 1.7–7.7)
Neutrophils Relative %: 46 % (ref 43–77)
Platelets: 222 10*3/uL (ref 150–400)
RBC: 4.88 MIL/uL (ref 4.22–5.81)
RDW: 13.8 % (ref 11.5–15.5)
WBC: 5 10*3/uL (ref 4.0–10.5)

## 2014-09-28 LAB — COMPREHENSIVE METABOLIC PANEL
ALBUMIN: 4.3 g/dL (ref 3.6–5.1)
ALK PHOS: 55 U/L (ref 40–115)
ALT: 13 U/L (ref 9–46)
AST: 16 U/L (ref 10–35)
BUN: 21 mg/dL (ref 7–25)
CALCIUM: 10.1 mg/dL (ref 8.6–10.3)
CO2: 24 mmol/L (ref 20–31)
Chloride: 103 mmol/L (ref 98–110)
Creat: 1.57 mg/dL — ABNORMAL HIGH (ref 0.70–1.18)
Glucose, Bld: 94 mg/dL (ref 70–99)
POTASSIUM: 3.5 mmol/L (ref 3.5–5.3)
Sodium: 139 mmol/L (ref 135–146)
Total Bilirubin: 0.5 mg/dL (ref 0.2–1.2)
Total Protein: 7.4 g/dL (ref 6.1–8.1)

## 2014-09-29 ENCOUNTER — Other Ambulatory Visit: Payer: Self-pay | Admitting: *Deleted

## 2014-09-29 ENCOUNTER — Encounter: Payer: Self-pay | Admitting: Family Medicine

## 2014-09-29 MED ORDER — ATORVASTATIN CALCIUM 10 MG PO TABS
10.0000 mg | ORAL_TABLET | Freq: Every day | ORAL | Status: DC
Start: 2014-09-29 — End: 2014-12-25

## 2014-12-25 ENCOUNTER — Ambulatory Visit (INDEPENDENT_AMBULATORY_CARE_PROVIDER_SITE_OTHER): Payer: Commercial Managed Care - HMO | Admitting: Family Medicine

## 2014-12-25 ENCOUNTER — Encounter: Payer: Self-pay | Admitting: Family Medicine

## 2014-12-25 VITALS — BP 128/88 | HR 82 | Temp 98.4°F | Resp 16 | Ht 66.0 in | Wt 190.0 lb

## 2014-12-25 DIAGNOSIS — I10 Essential (primary) hypertension: Secondary | ICD-10-CM

## 2014-12-25 DIAGNOSIS — E785 Hyperlipidemia, unspecified: Secondary | ICD-10-CM

## 2014-12-25 LAB — COMPREHENSIVE METABOLIC PANEL
ALK PHOS: 63 U/L (ref 40–115)
ALT: 12 U/L (ref 9–46)
AST: 15 U/L (ref 10–35)
Albumin: 3.7 g/dL (ref 3.6–5.1)
BILIRUBIN TOTAL: 0.4 mg/dL (ref 0.2–1.2)
BUN: 16 mg/dL (ref 7–25)
CALCIUM: 10.3 mg/dL (ref 8.6–10.3)
CO2: 25 mmol/L (ref 20–31)
Chloride: 101 mmol/L (ref 98–110)
Creat: 1.56 mg/dL — ABNORMAL HIGH (ref 0.70–1.18)
Glucose, Bld: 81 mg/dL (ref 70–99)
Potassium: 4.1 mmol/L (ref 3.5–5.3)
Sodium: 140 mmol/L (ref 135–146)
TOTAL PROTEIN: 7 g/dL (ref 6.1–8.1)

## 2014-12-25 LAB — CBC WITH DIFFERENTIAL/PLATELET
BASOS ABS: 0.1 10*3/uL (ref 0.0–0.1)
Basophils Relative: 1 % (ref 0–1)
Eosinophils Absolute: 0.3 10*3/uL (ref 0.0–0.7)
Eosinophils Relative: 3 % (ref 0–5)
HEMATOCRIT: 41.1 % (ref 39.0–52.0)
HEMOGLOBIN: 13.4 g/dL (ref 13.0–17.0)
LYMPHS ABS: 2.5 10*3/uL (ref 0.7–4.0)
Lymphocytes Relative: 30 % (ref 12–46)
MCH: 28.2 pg (ref 26.0–34.0)
MCHC: 32.6 g/dL (ref 30.0–36.0)
MCV: 86.5 fL (ref 78.0–100.0)
MPV: 11.1 fL (ref 8.6–12.4)
Monocytes Absolute: 0.3 10*3/uL (ref 0.1–1.0)
Monocytes Relative: 4 % (ref 3–12)
NEUTROS ABS: 5.2 10*3/uL (ref 1.7–7.7)
NEUTROS PCT: 62 % (ref 43–77)
Platelets: 269 10*3/uL (ref 150–400)
RBC: 4.75 MIL/uL (ref 4.22–5.81)
RDW: 13.4 % (ref 11.5–15.5)
WBC: 8.4 10*3/uL (ref 4.0–10.5)

## 2014-12-25 LAB — LIPID PANEL
CHOLESTEROL: 145 mg/dL (ref 125–200)
HDL: 27 mg/dL — AB (ref >=40)
LDL Cholesterol: 87 mg/dL (ref <130)
Total CHOL/HDL Ratio: 5.4 Ratio — ABNORMAL HIGH (ref <=5.0)
Triglycerides: 157 mg/dL — ABNORMAL HIGH (ref <150)
VLDL: 31 mg/dL — ABNORMAL HIGH (ref <30)

## 2014-12-25 MED ORDER — ATORVASTATIN CALCIUM 10 MG PO TABS: 10.0000 mg | ORAL_TABLET | Freq: Every day | ORAL | Status: DC

## 2014-12-25 MED ORDER — LOSARTAN POTASSIUM-HCTZ 50-12.5 MG PO TABS: 1.0000 | ORAL_TABLET | Freq: Every day | ORAL | Status: DC

## 2014-12-25 NOTE — Addendum Note (Signed)
Addended by: Vic Blackbird F on: 12/25/2014 02:21 PM   Modules accepted: Orders

## 2014-12-25 NOTE — Patient Instructions (Signed)
Continue current medications Eat breakfast, weight down today 4lbs  F/U 4 months

## 2014-12-25 NOTE — Progress Notes (Signed)
Patient ID: CANE NAEEM, male   DOB: 06-Jan-1936, 79 y.o.   MRN: [id number]   Subjective:    Patient ID: BRIDGER REDDIC, male    DOB: 06-Mar-1935, 79 y.o.   MRN: [id number]  Patient presents for 3 month F/U  patient here to follow-up chronic medical problems. They have no concerns today. He is living with his son and his wife. He is taking his medications as prescribed and takes both the blood pressure in the Lipitor at night. His weight is down 4 pounds. He states that he has not been eating breakfast. When he lived alone he likes to cook but now they're people in the house he will not eat breakfast on a regular basis but cannot give me a particular reason why. He denies any change in his bowels or his bladder. He denies any chest pain or shortness of breath.    Review Of Systems:  GEN- denies fatigue, fever, +weight loss,weakness, recent illness HEENT- denies eye drainage, change in vision, nasal discharge, CVS- denies chest pain, palpitations RESP- denies SOB, cough, wheeze ABD- denies N/V, change in stools, abd pain GU- denies dysuria, hematuria, dribbling, incontinence MSK- denies joint pain, muscle aches, injury Neuro- denies headache, dizziness, syncope, seizure activity       Objective:    BP 128/88 mmHg  Pulse 82  Temp(Src) 98.4 F (36.9 C) (Oral)  Resp 16  Ht 5\' 6"  (1.676 m)  Wt 190 lb (86.183 kg)  BMI 30.68 kg/m2 GEN- NAD, alert and oriented x3,WEIGHT DOWN 4lbs HEENT- PERRL, EOMI, non injected sclera, pink conjunctiva, MMM, oropharynx clear Neck- Supple, no thyromegaly CVS- RRR, no murmur RESP-CTAB ABD-NABS,soft,NT,ND EXT- No edema Pulses- Radial 2+        Assessment & Plan:      Problem List Items Addressed This Visit    Hyperlipidemia    Recheck lipid panel as well as liver function tests. Continue Lipitor      Relevant Orders   Lipid panel   Essential hypertension, benign - Primary    Blood pressure well controlled for his age. No change to  medication. We did discuss his habit of skipping breakfast. Advised him to eat something small the morning for breakfast. This will help with history and protein stores. There are no other concerns from the family he was accompanied by his grandson today. His flu shot is up-to-date.      Relevant Orders   CBC with Differential/Platelet   Comprehensive metabolic panel      Note: This dictation was prepared with Dragon dictation along with smaller phrase technology. Any transcriptional errors that result from this process are unintentional.

## 2014-12-25 NOTE — Assessment & Plan Note (Signed)
Blood pressure well controlled for his age. No change to medication. We did discuss his habit of skipping breakfast. Advised him to eat something small the morning for breakfast. This will help with history and protein stores. There are no other concerns from the family he was accompanied by his grandson today. His flu shot is up-to-date.

## 2014-12-25 NOTE — Assessment & Plan Note (Signed)
Recheck lipid panel as well as liver function tests. Continue Lipitor

## 2014-12-28 ENCOUNTER — Encounter: Payer: Self-pay | Admitting: *Deleted

## 2015-03-18 ENCOUNTER — Telehealth: Payer: Self-pay | Admitting: *Deleted

## 2015-03-18 NOTE — Telephone Encounter (Signed)
Received call from patient grand-daughter.   Reports that she would like to have [employer] forms completed for patient to have some assistance while she is at work.  Ok to complete forms?

## 2015-03-19 NOTE — Telephone Encounter (Signed)
He does not have any qualifying diagnosis that I can see You can try- put he arthritis first  Let grand-daughter know this may not be accepted

## 2015-03-22 NOTE — Telephone Encounter (Signed)
Patient granddaughter Scott Burton returned call and made aware.

## 2015-03-22 NOTE — Telephone Encounter (Signed)
Forms completed and faxed.   Call placed to patient granddaughter Sheena. [place].

## 2015-04-26 ENCOUNTER — Encounter: Payer: Self-pay | Admitting: Family Medicine

## 2015-04-26 ENCOUNTER — Ambulatory Visit (INDEPENDENT_AMBULATORY_CARE_PROVIDER_SITE_OTHER): Payer: Commercial Managed Care - HMO | Admitting: Family Medicine

## 2015-04-26 VITALS — BP 130/74 | HR 78 | Temp 98.1°F | Resp 18 | Ht 66.0 in | Wt 186.0 lb

## 2015-04-26 DIAGNOSIS — I10 Essential (primary) hypertension: Secondary | ICD-10-CM

## 2015-04-26 DIAGNOSIS — R634 Abnormal weight loss: Secondary | ICD-10-CM

## 2015-04-26 DIAGNOSIS — N183 Chronic kidney disease, stage 3 unspecified: Secondary | ICD-10-CM

## 2015-04-26 MED ORDER — ATORVASTATIN CALCIUM 10 MG PO TABS: 10.0000 mg | ORAL_TABLET | Freq: Every day | ORAL | Status: DC

## 2015-04-26 MED ORDER — LOSARTAN POTASSIUM-HCTZ 50-12.5 MG PO TABS: 1.0000 | ORAL_TABLET | Freq: Every day | ORAL | Status: DC

## 2015-04-26 NOTE — Patient Instructions (Signed)
Drink ensure every morning Weight continues to decrease he is not eating enough Kidney labs and thyroid labs rechecked F/U 1 month for weight

## 2015-04-26 NOTE — Progress Notes (Signed)
Patient ID: Scott Burton, male   DOB: 07-09-1935, 80 y.o.   MRN: [id number]    Subjective:    Patient ID: Scott Burton, male    DOB: 1935/07/10, 80 y.o.   MRN: [id number]  Patient presents for 4 month F/U  Patient here for follow-up on chronic medical problems. He is here today by himself. He has no specific concerns states he is taking his medicine as prescribed. Forcefully his weight is down another few pounds since her last visit. He is not eating breakfast and sometimes skipping lunch. He is at home during the day by himself though he does still drive and do a lot of things for himself. He lives with his son and his son's wife.  After the visit I spoke with his granddaughters I was unable to get in contact with his son Scott Burton. She states that they do provide him with ensure and he does tend to drink these throughout the day. She states that he is quite stubborn and if he  Has prepared meal in front of him and he will eat at that time. He has not had any recent illness no falls and otherwise has been doing well. They did have some services come out to the home but he declined using them.   Review Of Systems:  GEN- denies fatigue, fever, weight loss,weakness, recent illness HEENT- denies eye drainage, change in vision, nasal discharge, CVS- denies chest pain, palpitations RESP- denies SOB, cough, wheeze ABD- denies N/V, change in stools, abd pain GU- denies dysuria, hematuria, dribbling, incontinence MSK- denies joint pain, muscle aches, injury Neuro- denies headache, dizziness, syncope, seizure activity       Objective:    BP 130/74 mmHg  Pulse 78  Temp(Src) 98.1 F (36.7 C) (Oral)  Resp 18  Ht 5\' 6"  (1.676 m)  Wt 186 lb (84.369 kg)  BMI 30.04 kg/m2 GEN- NAD, alert and oriented x3 HEENT- PERRL, EOMI, non injected sclera, pink conjunctiva, MMM, oropharynx clear Neck- Supple, no thyromegaly CVS- RRR, no murmur RESP-CTAB ABD-NABS,soft,NT,ND EXT- No edema Pulses-  Radial  2+        Assessment & Plan:      Problem List Items Addressed This Visit    Loss of weight   Relevant Orders   TSH (Completed)   Essential hypertension, benign    Blood pressure is well-controlled continue current dose. We'll check his labs today including TSH. Concerned with his weight loss he is down 8 pounds. Discussed with the granddaughter first we will get his labs but they need to ensure that he is getting his meals throughout the day even if the prepared him ahead of time sitting and is haven't a warm up. I'm not convinced that this is just a low appetite problem versus just not having something already available at his age. We'll consider using Remeron 7.5 mg at bedtime and see if this prompted him to eat more. His weight is not at a dangerous level but I do not want to see the trend of persistent weight loss      Relevant Medications   losartan-hydrochlorothiazide (HYZAAR) 50-12.5 MG tablet   atorvastatin (LIPITOR) 10 MG tablet   Other Relevant Orders   COMPLETE METABOLIC PANEL WITH GFR (Completed)   CKD (chronic kidney disease) stage 3, GFR 30-59 ml/min - Primary   Relevant Orders   CBC with Differential/Platelet (Completed)   COMPLETE METABOLIC PANEL WITH GFR (Completed)      Note: This dictation was prepared with  Dragon dictation along with smaller Company secretary. Any transcriptional errors that result from this process are unintentional.

## 2015-04-27 ENCOUNTER — Encounter: Payer: Self-pay | Admitting: Family Medicine

## 2015-04-27 LAB — CBC WITH DIFFERENTIAL/PLATELET
BASOS PCT: 1 %
Basophils Absolute: 86 cells/uL (ref 0–200)
Eosinophils Absolute: 258 cells/uL (ref 15–500)
Eosinophils Relative: 3 %
HCT: 40.7 % (ref 38.5–50.0)
Hemoglobin: 13.5 g/dL (ref 13.0–17.0)
Lymphocytes Relative: 36 %
Lymphs Abs: 3096 cells/uL (ref 850–3900)
MCH: 29.3 pg (ref 27.0–33.0)
MCHC: 33.2 g/dL (ref 32.0–36.0)
MCV: 88.5 fL (ref 80.0–100.0)
MPV: 11.4 fL (ref 7.5–12.5)
Monocytes Absolute: 344 cells/uL (ref 200–950)
Monocytes Relative: 4 %
Neutro Abs: 4816 cells/uL (ref 1500–7800)
Neutrophils Relative %: 56 %
Platelets: 191 10*3/uL (ref 140–400)
RBC: 4.6 MIL/uL (ref 4.20–5.80)
RDW: 13.6 % (ref 11.0–15.0)
WBC: 8.6 10*3/uL (ref 3.8–10.8)

## 2015-04-27 LAB — COMPLETE METABOLIC PANEL WITH GFR
ALK PHOS: 56 U/L (ref 40–115)
ALT: 11 U/L (ref 9–46)
AST: 17 U/L (ref 10–35)
Albumin: 3.9 g/dL (ref 3.6–5.1)
BUN: 20 mg/dL (ref 7–25)
CHLORIDE: 104 mmol/L (ref 98–110)
CO2: 20 mmol/L (ref 20–31)
Calcium: 10.1 mg/dL (ref 8.6–10.3)
Creat: 1.63 mg/dL — ABNORMAL HIGH (ref 0.70–1.18)
GFR, EST AFRICAN AMERICAN: 46 mL/min — AB (ref >=60)
GFR, EST NON AFRICAN AMERICAN: 40 mL/min — AB (ref >=60)
GLUCOSE: 83 mg/dL (ref 70–99)
POTASSIUM: 3.7 mmol/L (ref 3.5–5.3)
Sodium: 141 mmol/L (ref 135–146)
Total Bilirubin: 0.5 mg/dL (ref 0.2–1.2)
Total Protein: 6.9 g/dL (ref 6.1–8.1)

## 2015-04-27 LAB — TSH: TSH: 1.4 mIU/L (ref 0.40–4.50)

## 2015-04-27 NOTE — Assessment & Plan Note (Signed)
Blood pressure is well-controlled continue current dose. We'll check his labs today including TSH. Concerned with his weight loss he is down 8 pounds. Discussed with the granddaughter first we will get his labs but they need to ensure that he is getting his meals throughout the day even if the prepared him ahead of time sitting and is haven't a warm up. I'm not convinced that this is just a low appetite problem versus just not having something already available at his age. We'll consider using Remeron 7.5 mg at bedtime and see if this prompted him to eat more. His weight is not at a dangerous level but I do not want to see the trend of persistent weight loss

## 2015-05-13 ENCOUNTER — Telehealth: Payer: Self-pay | Admitting: Family Medicine

## 2015-05-13 NOTE — Telephone Encounter (Signed)
Call placed to patient grandson.   Reports that patient has nonproductive cough x2 days. Denies fever/ chills, muscle aches, nasal draiange, etc.   Advised to use OTC Mucinex or Robitussin for cough. Advised to increase rest and to increase fluid intake. Recommended that if symptoms worsen or persist >1 week to contact office for visit.

## 2015-05-13 NOTE — Telephone Encounter (Signed)
Patients grandson calling to see what ulus can take for his indegestion  [phone]

## 2015-05-26 ENCOUNTER — Ambulatory Visit (INDEPENDENT_AMBULATORY_CARE_PROVIDER_SITE_OTHER): Payer: Commercial Managed Care - HMO | Admitting: Family Medicine

## 2015-05-26 ENCOUNTER — Encounter: Payer: Self-pay | Admitting: Family Medicine

## 2015-05-26 VITALS — BP 138/82 | HR 88 | Temp 98.1°F | Resp 16 | Ht 66.0 in | Wt 190.0 lb

## 2015-05-26 DIAGNOSIS — N183 Chronic kidney disease, stage 3 unspecified: Secondary | ICD-10-CM

## 2015-05-26 DIAGNOSIS — I10 Essential (primary) hypertension: Secondary | ICD-10-CM

## 2015-05-26 DIAGNOSIS — E785 Hyperlipidemia, unspecified: Secondary | ICD-10-CM

## 2015-05-26 DIAGNOSIS — R634 Abnormal weight loss: Secondary | ICD-10-CM

## 2015-05-26 DIAGNOSIS — R011 Cardiac murmur, unspecified: Secondary | ICD-10-CM | POA: Diagnosis not present

## 2015-05-26 LAB — BASIC METABOLIC PANEL
BUN: 21 mg/dL (ref 7–25)
CHLORIDE: 102 mmol/L (ref 98–110)
CO2: 20 mmol/L (ref 20–31)
Calcium: 9.9 mg/dL (ref 8.6–10.3)
Creat: 1.45 mg/dL — ABNORMAL HIGH (ref 0.70–1.18)
Glucose, Bld: 95 mg/dL (ref 70–99)
POTASSIUM: 4.1 mmol/L (ref 3.5–5.3)
SODIUM: 138 mmol/L (ref 135–146)

## 2015-05-26 LAB — LIPID PANEL
Cholesterol: 166 mg/dL (ref 125–200)
HDL: 29 mg/dL — AB (ref >=40)
LDL Cholesterol: 84 mg/dL (ref <130)
Total CHOL/HDL Ratio: 5.7 Ratio — ABNORMAL HIGH (ref <=5.0)
Triglycerides: 263 mg/dL — ABNORMAL HIGH (ref <150)
VLDL: 53 mg/dL — AB (ref <30)

## 2015-05-26 NOTE — Assessment & Plan Note (Signed)
Recheck lipids, on lipitor 10mg  at bedtime, recent LFT normal

## 2015-05-26 NOTE — Assessment & Plan Note (Signed)
Recheck labs, overall eating and drinking improved

## 2015-05-26 NOTE — Assessment & Plan Note (Signed)
Weight improved, continue to eat on regular basis, he also utilizes ensure/boost

## 2015-05-26 NOTE — Progress Notes (Signed)
Patient ID: Scott Burton, male   DOB: Dec 11, 1935, 80 y.o.   MRN: [id number]    Subjective:    Patient ID: Scott Burton, male    DOB: [birth year], 80 y.o.   MRN: [id number]  Patient presents for 4 week F/U  Issue here for a one-month follow-up on weight. After I discussed with his family members he states that he's been eating breakfast on a regular basis his weight is now up 4 pounds. He has no particular concerns. He is still taking his blood pressure and cholesterol medicine as prescribed he is due for recheck on his cholesterol. He has underlying chronic kidney disease stage III.  Murmur heard on exam, not heard prior to today, he states occasional SOB, nothing new, no CP, no fluid retention  Review Of Systems:  GEN- denies fatigue, fever, weight loss,weakness, recent illness HEENT- denies eye drainage, change in vision, nasal discharge, CVS- denies chest pain, palpitations RESP- occ SOB, cough, wheeze ABD- denies N/V, change in stools, abd pain GU- denies dysuria, hematuria, dribbling, incontinence MSK- denies joint pain, muscle aches, injury Neuro- denies headache, dizziness, syncope, seizure activity       Objective:    BP 138/82 mmHg  Pulse 88  Temp(Src) 98.1 F (36.7 C) (Oral)  Resp 16  Ht 5\' 6"  (1.676 m)  Wt 190 lb (86.183 kg)  BMI 30.68 kg/m2 GEN- NAD, alert and oriented x3 HEENT- PERRL, EOMI, non injected sclera, pink conjunctiva, MMM, oropharynx clear Neck- Supple, no bruit  CVS- RRR, 2/6 SEM Best LSB heard in carotids  RESP-CTAB EXT- No edema Pulses- Radial  2+        Assessment & Plan:      Problem List Items Addressed This Visit    Loss of weight    Weight improved, continue to eat on regular basis, he also utilizes ensure/boost      Hyperlipidemia    Recheck lipids, on lipitor 10mg  at bedtime, recent LFT normal      Relevant Orders   Lipid panel   Essential hypertension, benign   CKD (chronic kidney disease) stage 3, GFR 30-59 ml/min  - Primary    Recheck labs, overall eating and drinking improved       Relevant Orders   Basic metabolic panel    Other Visit Diagnoses    Murmur, cardiac        New murmur, he states he is occasionally SOB with his activites, denies CP, has underlying HTN evaluate, may be more sclerosis       Note: This dictation was prepared with Dragon dictation along with smaller phrase technology. Any transcriptional errors that result from this process are unintentional.

## 2015-05-26 NOTE — Patient Instructions (Addendum)
Weight is up 4lbs Continue current medications  Ultrasound of heart to be done - he has new heart murmur  F/U 4 months

## 2015-07-28 ENCOUNTER — Encounter: Payer: Self-pay | Admitting: Physician Assistant

## 2015-07-28 ENCOUNTER — Ambulatory Visit (INDEPENDENT_AMBULATORY_CARE_PROVIDER_SITE_OTHER): Payer: Commercial Managed Care - HMO | Admitting: Physician Assistant

## 2015-07-28 VITALS — BP 102/56 | HR 84 | Temp 98.4°F | Resp 18 | Wt 189.0 lb

## 2015-07-28 DIAGNOSIS — M25571 Pain in right ankle and joints of right foot: Secondary | ICD-10-CM

## 2015-07-28 MED ORDER — PREDNISONE 20 MG PO TABS
ORAL_TABLET | ORAL | Status: DC
Start: 2015-07-28 — End: 2015-08-10

## 2015-07-28 MED ORDER — LOSARTAN POTASSIUM 50 MG PO TABS: 50.0000 mg | ORAL_TABLET | Freq: Every day | ORAL | Status: DC

## 2015-07-29 LAB — CBC WITH DIFFERENTIAL/PLATELET
BASOS ABS: 0 {cells}/uL (ref 0–200)
Basophils Relative: 0 %
EOS ABS: 176 {cells}/uL (ref 15–500)
EOS PCT: 2 %
HCT: 37.5 % — ABNORMAL LOW (ref 38.5–50.0)
HEMOGLOBIN: 12.2 g/dL — AB (ref 13.0–17.0)
Lymphocytes Relative: 26 %
Lymphs Abs: 2288 cells/uL (ref 850–3900)
MCH: 28.3 pg (ref 27.0–33.0)
MCHC: 32.5 g/dL (ref 32.0–36.0)
MCV: 87 fL (ref 80.0–100.0)
MPV: 11 fL (ref 7.5–12.5)
Monocytes Absolute: 528 cells/uL (ref 200–950)
Monocytes Relative: 6 %
NEUTROS ABS: 5808 {cells}/uL (ref 1500–7800)
Neutrophils Relative %: 66 %
Platelets: 229 10*3/uL (ref 140–400)
RBC: 4.31 MIL/uL (ref 4.20–5.80)
RDW: 13.8 % (ref 11.0–15.0)
WBC: 8.8 10*3/uL (ref 3.8–10.8)

## 2015-07-29 LAB — COMPLETE METABOLIC PANEL WITH GFR
ALBUMIN: 3.8 g/dL (ref 3.6–5.1)
ALT: 13 U/L (ref 9–46)
AST: 13 U/L (ref 10–35)
Alkaline Phosphatase: 57 U/L (ref 40–115)
BILIRUBIN TOTAL: 0.6 mg/dL (ref 0.2–1.2)
BUN: 20 mg/dL (ref 7–25)
CO2: 25 mmol/L (ref 20–31)
CREATININE: 1.47 mg/dL — AB (ref 0.70–1.18)
Calcium: 9.6 mg/dL (ref 8.6–10.3)
Chloride: 103 mmol/L (ref 98–110)
GFR, EST NON AFRICAN AMERICAN: 45 mL/min — AB (ref >=60)
GFR, Est African American: 52 mL/min — ABNORMAL LOW (ref >=60)
GLUCOSE: 104 mg/dL — AB (ref 70–99)
Potassium: 3.7 mmol/L (ref 3.5–5.3)
SODIUM: 137 mmol/L (ref 135–146)
TOTAL PROTEIN: 7 g/dL (ref 6.1–8.1)

## 2015-07-29 LAB — URIC ACID: Uric Acid, Serum: 8.7 mg/dL — ABNORMAL HIGH (ref 4.0–8.0)

## 2015-07-29 NOTE — Progress Notes (Signed)
Patient ID: Scott Burton MRN: [id number], DOB: January 19, 1935, 80 y.o. Date of Encounter: @DATE @  Chief Complaint:  Chief Complaint  Patient presents with  . c/o foot and back pain    foot ? gout    HPI: 80 y.o. year old AA male  presents with above.   Says that his back has been hurting when he is bending over. Says that it hurts at the level of the "belt line/waist line'. Only feels this back pain when he bends over. No other complaints regarding back pain.  Says that his right foot has been "swollen up and sore ". Says it started 2 days ago. Had no trauma or injury to the foot. Says this happened one time before--- says that that episode was about 6 or 7 years ago-- when he went to the ER and they said it was gout. Says that that episode occurred in the same location on the right foot.  No other complaints or concerns today.   Past Medical History  Diagnosis Date  . Hypertension   . GERD (gastroesophageal reflux disease)   . Gout   . Arthritis   . CKD (chronic kidney disease), stage III   . Hyperlipidemia      Home Meds: Outpatient Prescriptions Prior to Visit  Medication Sig Dispense Refill  . atorvastatin (LIPITOR) 10 MG tablet Take 1 tablet (10 mg total) by mouth daily. 90 tablet 3  . losartan-hydrochlorothiazide (HYZAAR) 50-12.5 MG tablet Take 1 tablet by mouth daily. 90 tablet 3   No facility-administered medications prior to visit.    Allergies: No Known Allergies  Social History   Social History  . Marital Status: Widowed    Spouse Name: N/A  . Number of Children: N/A  . Years of Education: N/A   Occupational History  . Not on file.   Social History Main Topics  . Smoking status: Former Smoker    Quit date: 01/17/1995  . Smokeless tobacco: Former Systems developer    Quit date: 01/28/1991  . Alcohol Use: No  . Drug Use: No  . Sexual Activity: Not Currently   Other Topics Concern  . Not on file   Social History Narrative    Family History  Problem  Relation Age of Onset  . Cancer Father      Review of Systems:  See HPI for pertinent ROS. All other ROS negative.    Physical Exam: Blood pressure 102/56, pulse 84, temperature 98.4 F (36.9 C), temperature source Oral, resp. rate 18, weight 189 lb (85.73 kg)., Body mass index is 30.52 kg/(m^2). General: WNWD AAM. Appears in no acute distress. Neck: Supple. No thyromegaly. No lymphadenopathy. Lungs: Clear bilaterally to auscultation without wheezes, rales, or rhonchi. Breathing is unlabored. Heart: RRR with S1 S2. No murmurs, rubs, or gallops. Musculoskeletal:  Strength and tone normal for age. Extremities/Skin:  Right Foot----At MTP joint of 1st toe----this area is swollen. His skin is brown in general but this affected area is darker brown/black. It is tender with palpation.  There is no abrasion. No abscess. Neuro: Alert and oriented X 3. Moves all extremities spontaneously. Gait is normal. CNII-XII grossly in tact. Psych:  Responds to questions appropriately with a normal affect.     ASSESSMENT AND PLAN:  80 y.o. year old male with  1. Pain, joint, foot, right Suspect gout.  Will d/c HCTZ----Change Hyzaar to plain Cozaar.  He has chronic kidney disease and his last creatinine was 1.45. Therefore,  will avoid NSAID and  instead will treat with prednisone taper. If labs are consistent with this being gout then will schedule him a follow-up office visit with Dr. [name] in a couple of weeks to initiate Uloric. (No allopurinol given CKD)  - losartan (COZAAR) 50 MG tablet; Take 1 tablet (50 mg total) by mouth daily.  Dispense: 90 tablet; Refill: 3 - predniSONE (DELTASONE) 20 MG tablet; Take 3 daily for 2 days, then 2 daily for 2 days, then 1 daily for 2 days.  Dispense: 12 tablet; Refill: 0 - CBC with Differential/Platelet - COMPLETE METABOLIC PANEL WITH GFR - Uric acid  Back Pain --Prednisone taper prescribed for above should also help with his back pain.  Signed, [address]  [place], [place], [facility] 07/29/2015 9:08 AM

## 2015-08-10 ENCOUNTER — Encounter: Payer: Self-pay | Admitting: Family Medicine

## 2015-08-10 ENCOUNTER — Ambulatory Visit (INDEPENDENT_AMBULATORY_CARE_PROVIDER_SITE_OTHER): Payer: Commercial Managed Care - HMO | Admitting: Family Medicine

## 2015-08-10 VITALS — BP 122/68 | HR 68 | Temp 98.7°F | Resp 18 | Ht 66.0 in | Wt 190.0 lb

## 2015-08-10 DIAGNOSIS — M25571 Pain in right ankle and joints of right foot: Secondary | ICD-10-CM

## 2015-08-10 DIAGNOSIS — I10 Essential (primary) hypertension: Secondary | ICD-10-CM | POA: Diagnosis not present

## 2015-08-10 DIAGNOSIS — M10071 Idiopathic gout, right ankle and foot: Secondary | ICD-10-CM

## 2015-08-10 DIAGNOSIS — M109 Gout, unspecified: Secondary | ICD-10-CM | POA: Insufficient documentation

## 2015-08-10 MED ORDER — ALLOPURINOL 100 MG PO TABS: 100.0000 mg | ORAL_TABLET | Freq: Every day | ORAL | 6 refills | Status: DC

## 2015-08-10 MED ORDER — PREDNISONE 20 MG PO TABS: ORAL_TABLET | ORAL | 0 refills | Status: DC

## 2015-08-10 NOTE — Assessment & Plan Note (Signed)
Consistent with gout, GFR 52 so will start allopurinol in 1 week, at end of prednisone taper  Will repeat prednisone taper today, epson salt soak, discussed avoidance of foods that trigger gout

## 2015-08-10 NOTE — Progress Notes (Signed)
    Subjective:    Patient ID: Scott Burton, male    DOB: 17-Mar-1935, 80 y.o.   MRN: [id number]  Patient presents for Gout Pain (reports that R foot pain noted to be gout, but it continues to have issues, would lie daily preventative) Patient with continued gout. He presented with podagra of his right great toe. This was about 2 weeks ago. At that time his uric gas it was elevated 8.7. His height a quick thiazide was discontinued he was continued on losartan. He was given prednisone taper which helped with the swelling in most of the discomfort but he is still having some in the toe. He is still able to ambulate without much difficulty. He has not had any drainage or other injury to the toe. He will likely put on something to prevent gout. He did have a gout flare a few years ago but has not had anything recently otherwise.    Review Of Systems:  GEN- denies fatigue, fever, weight loss,weakness, recent illness HEENT- denies eye drainage, change in vision, nasal discharge, CVS- denies chest pain, palpitations RESP- denies SOB, cough, wheeze ABD- denies N/V, change in stools, abd pain GU- denies dysuria, hematuria, dribbling, incontinence MSK- + joint pain, muscle aches, injury Neuro- denies headache, dizziness, syncope, seizure activity       Objective:    BP 122/68 (BP Location: Left Arm, Patient Position: Sitting, Cuff Size: Normal)   Pulse 68   Temp 98.7 F (37.1 C) (Oral)   Resp 18   Ht 5\' 6"  (1.676 m)   Wt 190 lb (86.2 kg)   BMI 30.67 kg/m  GEN- NAD, alert and oriented x3 HEENT- PERRL, EOMI, non injected sclera, pink conjunctiva, MMM, oropharynx clear Neck- Supple, no thyromegaly CVS- RRR,2/6 SEM  RESP-CTAB EXT- No edema, Right great toe swelling at base, mild hyperpigmented discoloratation, TTP at base of toe, fair ROM, no open lesions, no erythema  Pulses- Radial, DP- 2+        Assessment & Plan:      Problem List Items Addressed This Visit    Gout   Consistent with gout, GFR 52 so will start allopurinol in 1 week, at end of prednisone taper  Will repeat prednisone taper today, epson salt soak, discussed avoidance of foods that trigger gout       Essential hypertension, benign - Primary    Controlled with change in medication       Other Visit Diagnoses    Pain, joint, foot, right       Relevant Medications   predniSONE (DELTASONE) 20 MG tablet      Note: This dictation was prepared with Dragon dictation along with smaller phrase technology. Any transcriptional errors that result from this process are unintentional.

## 2015-08-10 NOTE — Patient Instructions (Signed)
Start prednisone Wait 1 week, then start allopurinol to keep gout levels down F/U 6 weeks for gout and blood test for uric acid

## 2015-08-10 NOTE — Assessment & Plan Note (Signed)
Controlled with change in medication

## 2015-09-22 ENCOUNTER — Encounter: Payer: Self-pay | Admitting: Family Medicine

## 2015-09-22 ENCOUNTER — Ambulatory Visit (INDEPENDENT_AMBULATORY_CARE_PROVIDER_SITE_OTHER): Payer: Commercial Managed Care - HMO | Admitting: Family Medicine

## 2015-09-22 VITALS — BP 134/66 | HR 82 | Temp 97.9°F | Resp 16 | Ht 66.0 in | Wt 192.0 lb

## 2015-09-22 DIAGNOSIS — Z23 Encounter for immunization: Secondary | ICD-10-CM | POA: Diagnosis not present

## 2015-09-22 DIAGNOSIS — M10071 Idiopathic gout, right ankle and foot: Secondary | ICD-10-CM | POA: Diagnosis not present

## 2015-09-22 LAB — COMPREHENSIVE METABOLIC PANEL
ALBUMIN: 4 g/dL (ref 3.6–5.1)
ALT: 12 U/L (ref 9–46)
AST: 15 U/L (ref 10–35)
Alkaline Phosphatase: 61 U/L (ref 40–115)
BUN: 15 mg/dL (ref 7–25)
CHLORIDE: 104 mmol/L (ref 98–110)
CO2: 26 mmol/L (ref 20–31)
Calcium: 10.3 mg/dL (ref 8.6–10.3)
Creat: 1.5 mg/dL — ABNORMAL HIGH (ref 0.70–1.18)
Glucose, Bld: 119 mg/dL — ABNORMAL HIGH (ref 70–99)
POTASSIUM: 4.7 mmol/L (ref 3.5–5.3)
Sodium: 141 mmol/L (ref 135–146)
Total Bilirubin: 0.6 mg/dL (ref 0.2–1.2)
Total Protein: 6.8 g/dL (ref 6.1–8.1)

## 2015-09-22 LAB — URIC ACID: Uric Acid, Serum: 7.1 mg/dL (ref 4.0–8.0)

## 2015-09-22 NOTE — Patient Instructions (Addendum)
F/U 4 months for physical Keep taking current medications FLu shot given   Foods to limit (very high in purines):  Organ meats, such as liver, kidneys, sweetbreads, and brains  Meats, including bacon, beef, pork, and lamb  Game meats  Any other meats in large amounts  Anchovies, sardines, herring, mackerel, and scallops  Gravy  Beer

## 2015-09-22 NOTE — Progress Notes (Signed)
   Subjective:    Patient ID: Scott Burton, male    DOB: 07-30-1935, 80 y.o.   MRN: [id number]  Patient presents for Follow-up (6 week- is not fasting) Patient here for interim follow-up on his gout. His gout flare did improve after the prednisone he was also started on allopurinol 100 mg. His uric acid level was elevated to 8.7 he is due for recheck today on allopurinol. He is doing well with the new blood pressure medicine losartan. He does not have any new concerns today    Review Of Systems:  GEN- denies fatigue, fever, weight loss,weakness, recent illness HEENT- denies eye drainage, change in vision, nasal discharge, CVS- denies chest pain, palpitations RESP- denies SOB, cough, wheeze ABD- denies N/V, change in stools, abd pain GU- denies dysuria, hematuria, dribbling, incontinence MSK- denies joint pain, muscle aches, injury Neuro- denies headache, dizziness, syncope, seizure activity       Objective:    BP 134/66 (BP Location: Left Arm, Patient Position: Sitting, Cuff Size: Normal)   Pulse 82   Temp 97.9 F (36.6 C) (Oral)   Resp 16   Ht 5\' 6"  (1.676 m)   Wt 192 lb (87.1 kg)   BMI 30.99 kg/m  HEENT- PERRL, EOMI, non injected sclera, pink conjunctiva, MMM, oropharynx clear Neck- Supple, no thyromegaly CVS- RRR,2/6 SEM  RESP-CTAB Ext- no edema, NT great Right great toe Pulse- DP 2+        Assessment & Plan:      Problem List Items Addressed This Visit    Gout - Primary    Recheck uric acid and renal function. Continue allopurinol discussed foods to avoid to prevent gout flare      Relevant Orders   Uric Acid   Comprehensive metabolic panel    Other Visit Diagnoses    Need for prophylactic vaccination and inoculation against influenza       Relevant Orders   Flu Vaccine QUAD 36+ mos PF IM (Fluarix & Fluzone Quad PF) (Completed)      Note: This dictation was prepared with Dragon dictation along with smaller phrase technology. Any transcriptional  errors that result from this process are unintentional.

## 2015-09-22 NOTE — Assessment & Plan Note (Signed)
Recheck uric acid and renal function. Continue allopurinol discussed foods to avoid to prevent gout flare

## 2015-11-09 ENCOUNTER — Telehealth: Payer: Self-pay | Admitting: *Deleted

## 2015-11-09 NOTE — Telephone Encounter (Signed)
Received call from patient granddaughter in regards to medications.   States that she is not sure what he is supposed to be taking. Advised of current medication list.   Requested that medications be clarified with pharmacy.   Call placed to pharmacy and medications clarified.

## 2016-01-24 ENCOUNTER — Telehealth: Payer: Self-pay | Admitting: *Deleted

## 2016-01-24 DIAGNOSIS — M25571 Pain in right ankle and joints of right foot: Secondary | ICD-10-CM

## 2016-01-24 MED ORDER — LOSARTAN POTASSIUM 50 MG PO TABS: 50.0000 mg | ORAL_TABLET | Freq: Every day | ORAL | 3 refills | Status: DC

## 2016-01-24 NOTE — Telephone Encounter (Signed)
Received call from patient caregiver.   Requested refill on Losartan.   Refill appropriate and filled per protocol.

## 2016-02-29 ENCOUNTER — Other Ambulatory Visit: Payer: Self-pay | Admitting: Family Medicine

## 2016-02-29 DIAGNOSIS — Z1211 Encounter for screening for malignant neoplasm of colon: Secondary | ICD-10-CM

## 2016-03-09 ENCOUNTER — Telehealth: Payer: Self-pay

## 2016-03-09 ENCOUNTER — Ambulatory Visit (INDEPENDENT_AMBULATORY_CARE_PROVIDER_SITE_OTHER): Payer: Medicare HMO | Admitting: Physician Assistant

## 2016-03-09 ENCOUNTER — Encounter: Payer: Self-pay | Admitting: Physician Assistant

## 2016-03-09 VITALS — BP 118/64 | HR 81 | Temp 98.2°F | Resp 16 | Wt 185.2 lb

## 2016-03-09 DIAGNOSIS — B9789 Other viral agents as the cause of diseases classified elsewhere: Secondary | ICD-10-CM | POA: Diagnosis not present

## 2016-03-09 DIAGNOSIS — J988 Other specified respiratory disorders: Secondary | ICD-10-CM | POA: Diagnosis not present

## 2016-03-09 MED ORDER — HYDROCODONE-HOMATROPINE 5-1.5 MG/5ML PO SYRP: 5.0000 mL | ORAL_SOLUTION | Freq: Three times a day (TID) | ORAL | 0 refills | Status: DC | PRN

## 2016-03-09 NOTE — Telephone Encounter (Signed)
Scott Burton (sister) called to set up procedure for her brother (patient). Please call [phone]

## 2016-03-09 NOTE — Progress Notes (Signed)
    Patient ID: DRAELYN SERVICE MRN: [id number], DOB: [birth year], 81 y.o. Date of Encounter: 03/09/2016, 3:59 PM    Chief Complaint:  Chief Complaint  Patient presents with  . Cough    x3days  . Fatigue     HPI: 81 y.o. year old male presents with above.   Says that this started 3 days ago but is getting better. He was coughing even more---says that he "would start coughing and would just keep going"--that does not happen like it was.  Has had no fevers or chills. He has gotten very little out of his nose. Has had no sore throat. He does have more cough at night but even that is better than it was.     Home Meds:   Outpatient Medications Prior to Visit  Medication Sig Dispense Refill  . allopurinol (ZYLOPRIM) 100 MG tablet Take 1 tablet (100 mg total) by mouth daily. For gout 30 tablet 6  . atorvastatin (LIPITOR) 10 MG tablet Take 1 tablet (10 mg total) by mouth daily. 90 tablet 3  . losartan (COZAAR) 50 MG tablet Take 1 tablet (50 mg total) by mouth daily. 90 tablet 3   No facility-administered medications prior to visit.     Allergies: No Known Allergies    Review of Systems: See HPI for pertinent ROS. All other ROS negative.    Physical Exam: Blood pressure 118/64, pulse 81, temperature 98.2 F (36.8 C), temperature source Oral, resp. rate 16, weight 185 lb 3.2 oz (84 kg), SpO2 98 %., Body mass index is 29.89 kg/m. General:  WNWD AAM. Appears in no acute distress. HEENT: Normocephalic, atraumatic, eyes without discharge, sclera non-icteric, nares are without discharge. Bilateral auditory canals clear, TM's are without perforation, pearly grey and translucent with reflective cone of light bilaterally. Oral cavity moist, posterior pharynx without exudate, erythema, peritonsillar abscess.  Neck: Supple. No thyromegaly. No lymphadenopathy. Lungs: Clear bilaterally to auscultation without wheezes, rales, or rhonchi. Breathing is unlabored. Lungs are clear throughout. No  wheezes rhonchi or rales. Good breath sounds and air movement throughout. Heart: Regular rhythm. II/VI murmur loudest at 2nd ICS Msk:  Strength and tone normal for age. Extremities/Skin: Warm and dry.  Neuro: Alert and oriented X 3. Moves all extremities spontaneously. Gait is normal. CNII-XII grossly in tact. Psych:  Responds to questions appropriately with a normal affect.     ASSESSMENT AND PLAN:  81 y.o. year old male with  1. Viral respiratory infection Use Hycodan for cough suppressant especially so he can get some sleep. Follow up if symptoms worsen or develops fever or if symptoms persist greater than 7-10 days. - HYDROcodone-homatropine (HYCODAN) 5-1.5 MG/5ML syrup; Take 5 mLs by mouth every 8 (eight) hours as needed for cough.  Dispense: 120 mL; Refill: 0   Signed, [id number] [place], [place], [facility] 03/09/2016 3:59 PM

## 2016-03-09 NOTE — Telephone Encounter (Signed)
Triaged today.  

## 2016-03-20 ENCOUNTER — Other Ambulatory Visit: Payer: Self-pay

## 2016-03-20 DIAGNOSIS — Z1211 Encounter for screening for malignant neoplasm of colon: Secondary | ICD-10-CM

## 2016-03-20 MED ORDER — PEG-KCL-NACL-NASULF-NA ASC-C 100 G PO SOLR: 1.0000 | ORAL | 0 refills | Status: DC

## 2016-03-20 NOTE — Telephone Encounter (Signed)
To [facility] to mail instructions.

## 2016-03-20 NOTE — Telephone Encounter (Signed)
Rx sent into Pharmacy and instructions are in the mail. NO PA is needed

## 2016-03-20 NOTE — Telephone Encounter (Signed)
MOVI PREP SPLIT DOSING, REGULAR BREAKFAST. CLEAR LIQUIDS AFTER 9 AM.  CONTINUE GLUCOPHAGE.  

## 2016-03-20 NOTE — Telephone Encounter (Signed)
Gastroenterology Pre-Procedure Review  Request Date: 03/09/2016 Requesting Physician: Dr. [name]  PATIENT REVIEW QUESTIONS: The patient responded to the following health history questions as indicated:    1. Diabetes Melitis: no 2. Joint replacements in the past 12 months: no 3. Major health problems in the past 3 months: no 4. Has an artificial valve or MVP: no 5. Has a defibrillator: no 6. Has been advised in past to take antibiotics in advance of a procedure like teeth cleaning: no 7. Family history of colon cancer: no  8. Alcohol Use: no 9. History of sleep apnea: no   10. History of coronary artery or other vascular stents placed within the last 12 months: no    MEDICATIONS & ALLERGIES:    Patient reports the following regarding taking any blood thinners:   Plavix? no Aspirin? no Coumadin? no Brilinta? no Xarelto? no Eliquis? no Pradaxa? no Savaysa? no Effient? no  Patient confirms/reports the following medications:  Current Outpatient Prescriptions  Medication Sig Dispense Refill  . allopurinol (ZYLOPRIM) 100 MG tablet Take 1 tablet (100 mg total) by mouth daily. For gout 30 tablet 6  . atorvastatin (LIPITOR) 10 MG tablet Take 1 tablet (10 mg total) by mouth daily. 90 tablet 3  . HYDROcodone-homatropine (HYCODAN) 5-1.5 MG/5ML syrup Take 5 mLs by mouth every 8 (eight) hours as needed for cough. 120 mL 0  . losartan (COZAAR) 50 MG tablet Take 1 tablet (50 mg total) by mouth daily. 90 tablet 3   No current facility-administered medications for this visit.     Patient confirms/reports the following allergies:  No Known Allergies  No orders of the defined types were placed in this encounter.   AUTHORIZATION INFORMATION Primary Insurance:  ID #:  Group #:  Pre-Cert / Auth required Pre-Cert / Auth #:   Secondary Insurance:   ID #:   Group #:  Pre-Cert / Auth required:  Pre-Cert / Auth #:   SCHEDULE INFORMATION: Procedure has been scheduled as follows:  Date:  04/03/2016          Time:  11:30 AM Location: [facility] Short Stay  This Gastroenterology Pre-Precedure Review Form is being routed to the following provider(s): Barney Drain, MD

## 2016-04-03 ENCOUNTER — Telehealth: Payer: Self-pay

## 2016-04-03 MED ORDER — PEG-KCL-NACL-NASULF-NA ASC-C 100 G PO SOLR: 1.0000 | ORAL | 0 refills | Status: DC

## 2016-04-03 NOTE — Telephone Encounter (Signed)
Per Threasa Beards, pt had not done his prep correctly and needs to be rescheduled. I called pt and he wanted me to speak to his granddaughter, [name]. She has rescheduled the procedure for him to 04/19/2016 at 9:30 am with Dr. Oneida Alar. I had to change his address in the computer, since he is living with her now on [address]. I am sending in prep and mailing new instructions.

## 2016-04-03 NOTE — Telephone Encounter (Signed)
Pharmacist called and said Movie Prep is not on pt's formulary. She asked could we use Suprep and I told her yes. I called and informed the pt's granddaughter. She will disregard the previous instructions for Movie prep.  I am sending new instruction's for the Suprep.

## 2016-04-19 ENCOUNTER — Ambulatory Visit (HOSPITAL_COMMUNITY)
Admission: RE | Admit: 2016-04-19 | Discharge: 2016-04-19 | Disposition: A | Discharge status: Home Health | Payer: Medicare HMO | Source: Ambulatory Visit | Attending: Gastroenterology | Admitting: Gastroenterology

## 2016-04-19 ENCOUNTER — Encounter (HOSPITAL_COMMUNITY)
Admission: RE | Disposition: A | Discharge status: Home Health | Payer: Self-pay | Source: Ambulatory Visit | Attending: Gastroenterology

## 2016-04-19 ENCOUNTER — Encounter (HOSPITAL_COMMUNITY): Payer: Self-pay | Admitting: *Deleted

## 2016-04-19 DIAGNOSIS — E785 Hyperlipidemia, unspecified: Secondary | ICD-10-CM | POA: Diagnosis not present

## 2016-04-19 DIAGNOSIS — K219 Gastro-esophageal reflux disease without esophagitis: Secondary | ICD-10-CM | POA: Diagnosis not present

## 2016-04-19 DIAGNOSIS — Z1212 Encounter for screening for malignant neoplasm of rectum: Secondary | ICD-10-CM | POA: Diagnosis not present

## 2016-04-19 DIAGNOSIS — K648 Other hemorrhoids: Secondary | ICD-10-CM | POA: Diagnosis not present

## 2016-04-19 DIAGNOSIS — Z818 Family history of other mental and behavioral disorders: Secondary | ICD-10-CM | POA: Insufficient documentation

## 2016-04-19 DIAGNOSIS — Z1211 Encounter for screening for malignant neoplasm of colon: Secondary | ICD-10-CM | POA: Diagnosis not present

## 2016-04-19 DIAGNOSIS — M109 Gout, unspecified: Secondary | ICD-10-CM | POA: Diagnosis not present

## 2016-04-19 DIAGNOSIS — I129 Hypertensive chronic kidney disease with stage 1 through stage 4 chronic kidney disease, or unspecified chronic kidney disease: Secondary | ICD-10-CM | POA: Diagnosis not present

## 2016-04-19 DIAGNOSIS — K633 Ulcer of intestine: Secondary | ICD-10-CM | POA: Diagnosis not present

## 2016-04-19 DIAGNOSIS — Z87891 Personal history of nicotine dependence: Secondary | ICD-10-CM | POA: Insufficient documentation

## 2016-04-19 DIAGNOSIS — K624 Stenosis of anus and rectum: Secondary | ICD-10-CM | POA: Insufficient documentation

## 2016-04-19 DIAGNOSIS — K5730 Diverticulosis of large intestine without perforation or abscess without bleeding: Secondary | ICD-10-CM | POA: Insufficient documentation

## 2016-04-19 DIAGNOSIS — K635 Polyp of colon: Secondary | ICD-10-CM | POA: Diagnosis not present

## 2016-04-19 DIAGNOSIS — M1990 Unspecified osteoarthritis, unspecified site: Secondary | ICD-10-CM | POA: Insufficient documentation

## 2016-04-19 DIAGNOSIS — D120 Benign neoplasm of cecum: Secondary | ICD-10-CM | POA: Diagnosis not present

## 2016-04-19 DIAGNOSIS — Z79899 Other long term (current) drug therapy: Secondary | ICD-10-CM | POA: Insufficient documentation

## 2016-04-19 DIAGNOSIS — N183 Chronic kidney disease, stage 3 (moderate): Secondary | ICD-10-CM | POA: Insufficient documentation

## 2016-04-19 HISTORY — PX: COLONOSCOPY: SHX5424

## 2016-04-19 SURGERY — COLONOSCOPY
Anesthesia: Moderate Sedation

## 2016-04-19 MED ORDER — STERILE WATER FOR IRRIGATION IR SOLN
Status: DC | PRN
  Administered 2016-04-19: 100 mL

## 2016-04-19 MED ORDER — MEPERIDINE HCL 100 MG/ML IJ SOLN
INTRAMUSCULAR | Status: AC
  Filled 2016-04-19: qty 2

## 2016-04-19 MED ORDER — MEPERIDINE HCL 100 MG/ML IJ SOLN
INTRAMUSCULAR | Status: DC | PRN
  Administered 2016-04-19 (×3): 25 mg via INTRAVENOUS

## 2016-04-19 MED ORDER — MIDAZOLAM HCL 5 MG/5ML IJ SOLN
INTRAMUSCULAR | Status: DC | PRN
  Administered 2016-04-19 (×2): 1 mg via INTRAVENOUS
  Administered 2016-04-19: 2 mg via INTRAVENOUS

## 2016-04-19 MED ORDER — SODIUM CHLORIDE 0.9 % IV SOLN
INTRAVENOUS | Status: DC
  Administered 2016-04-19: 09:00:00 via INTRAVENOUS

## 2016-04-19 MED ORDER — MIDAZOLAM HCL 5 MG/5ML IJ SOLN
INTRAMUSCULAR | Status: AC
  Filled 2016-04-19: qty 10

## 2016-04-19 NOTE — H&P (Signed)
  Primary Care Physician:  Vic Blackbird, MD Primary Gastroenterologist:  Dr. Oneida Alar  Pre-Procedure History & Physical: HPI:  Scott Burton is a 81 y.o. male here for [place].  Past Medical History:  Diagnosis Date  . Arthritis   . CKD (chronic kidney disease), stage III   . GERD (gastroesophageal reflux disease)   . Gout   . Hyperlipidemia   . Hypertension     Past Surgical History:  Procedure Laterality Date  . NO PAST SURGERIES      Prior to Admission medications   Medication Sig Start Date End Date Taking? Authorizing Provider  atorvastatin (LIPITOR) 10 MG tablet Take 1 tablet (10 mg total) by mouth daily. Patient taking differently: Take 10 mg by mouth daily before breakfast.  04/26/15  Yes [name], MD  losartan (COZAAR) 50 MG tablet Take 1 tablet (50 mg total) by mouth daily. Patient taking differently: Take 50 mg by mouth daily before breakfast.  01/24/16  Yes [name], MD  peg 3350 powder (MOVIPREP) 100 g SOLR Take 1 kit (200 g total) by mouth as directed. 03/20/16  Yes Danie Binder, MD  allopurinol (ZYLOPRIM) 100 MG tablet Take 1 tablet (100 mg total) by mouth daily. For gout Patient not taking: Reported on 03/30/2016 08/10/15   [name], MD  HYDROcodone-homatropine [facility]) 5-1.5 MG/5ML syrup Take 5 mLs by mouth every 8 (eight) hours as needed for cough. Patient not taking: Reported on 03/30/2016 03/09/16   Orlena Sheldon, PA-C  peg 3350 powder (MOVIPREP) 100 g SOLR Take 1 kit (200 g total) by mouth as directed. 04/03/16   Danie Binder, MD    Allergies as of 03/20/2016  . (No Known Allergies)    Family History  Problem Relation Age of Onset  . Cancer Father   . Dementia Mother     Social History   Social History  . Marital status: Widowed    Spouse name: N/A  . Number of children: N/A  . Years of education: N/A   Occupational History  . Not on file.   Social History Main Topics  . Smoking status: Former Smoker    Quit date: 01/17/1995  . Smokeless tobacco: Former Systems developer    Quit date: 01/28/1991  . Alcohol use No  . Drug use: No  . Sexual activity: Not Currently   Other Topics Concern  . Not on file   Social History Narrative  . No narrative on file    Review of Systems: See HPI, otherwise negative ROS   Physical Exam: Ht '5\' 6"'$  (1.676 m)   Wt 185 lb (83.9 kg)   BMI 29.86 kg/m  General:   Alert,  pleasant and cooperative in NAD Head:  Normocephalic and atraumatic. Neck:  Supple; Lungs:  Clear throughout to auscultation.    Heart:  Regular rate and rhythm. Abdomen:  Soft, nontender and nondistended. Normal bowel sounds, without guarding, and without rebound.   Neurologic:  Alert and  oriented x4;  grossly normal neurologically.  Impression/Plan:     SCREENING  Plan:  1. TCS TODAY. DISCUSSED PROCEDURE, BENEFITS, & RISKS: < 1% chance of medication reaction, bleeding, perforation, or rupture of spleen/liver.

## 2016-04-19 NOTE — Discharge Instructions (Signed)
YOUR ANAL CANAL IS NARROW. You have small lnternal hemorrhoids. YOU HAVE diverticulosis IN YOUR LEFT COLON.  YOU HAD ONE SMALL POLYP REMOVED.     DRINK WATER TO KEEP YOUR URINE LIGHT YELLOW.  FOLLOW A HIGH FIBER DIET. AVOID ITEMS THAT CAUSE BLOATING. See info below.  USE PREPARATION H FOUR TIMES  A DAY IF NEEDED TO RELIEVE RECTAL PAIN/PRESSURE/BLEEDING.   YOUR BIOPSY RESULTS WILL BE AVAILABLE IN MY CHART  AFTER APR 6 AND MY OFFICE WILL CONTACT YOU IN 10-14 DAYS WITH YOUR RESULTS.   Next colonoscopy in 10-15 years IF THE BENEFITS OUTWEIGH THE RISKS.  Colonoscopy Care After Read the instructions outlined below and refer to this sheet in the next week. These discharge instructions provide you with general information on caring for yourself after you leave the hospital. While your treatment has been planned according to the most current medical practices available, unavoidable complications occasionally occur. If you have any problems or questions after discharge, call DR. Beverlyn Mcginness, [phone].  ACTIVITY  You may resume your regular activity, but move at a slower pace for the next 24 hours.   Take frequent rest periods for the next 24 hours.   Walking will help get rid of the air and reduce the bloated feeling in your belly (abdomen).   No driving for 24 hours (because of the medicine (anesthesia) used during the test).   You may shower.   Do not sign any important legal documents or operate any machinery for 24 hours (because of the anesthesia used during the test).    NUTRITION  Drink plenty of fluids.   You may resume your normal diet as instructed by your doctor.   Begin with a light meal and progress to your normal diet. Heavy or fried foods are harder to digest and may make you feel sick to your stomach (nauseated).   Avoid alcoholic beverages for 24 hours or as instructed.    MEDICATIONS  You may resume your normal medications.   WHAT YOU CAN EXPECT TODAY  Some  feelings of bloating in the abdomen.   Passage of more gas than usual.   Spotting of blood in your stool or on the toilet paper  .  IF YOU HAD POLYPS REMOVED DURING THE COLONOSCOPY:  Eat a soft diet IF YOU HAVE NAUSEA, BLOATING, ABDOMINAL PAIN, OR VOMITING.    FINDING OUT THE RESULTS OF YOUR TEST Not all test results are available during your visit. DR. Oneida Alar WILL CALL YOU WITHIN 14 DAYS OF YOUR PROCEDUE WITH YOUR RESULTS. Do not assume everything is normal if you have not heard from DR. Irvin Lizama, CALL HER OFFICE AT [phone].  SEEK IMMEDIATE MEDICAL ATTENTION AND CALL THE OFFICE: [phone] IF:  You have more than a spotting of blood in your stool.   Your belly is swollen (abdominal distention).   You are nauseated or vomiting.   You have a temperature over 101F.   You have abdominal pain or discomfort that is severe or gets worse throughout the day.  High-Fiber Diet A high-fiber diet changes your normal diet to include more whole grains, legumes, fruits, and vegetables. Changes in the diet involve replacing refined carbohydrates with unrefined foods. The calorie level of the diet is essentially unchanged. The Dietary Reference Intake (recommended amount) for adult males is 38 grams per day. For adult females, it is 25 grams per day. Pregnant and lactating women should consume 28 grams of fiber per day. Fiber is the intact part of a plant  that is not broken down during digestion. Functional fiber is fiber that has been isolated from the plant to provide a beneficial effect in the body. PURPOSE  Increase stool bulk.   Ease and regulate bowel movements.   Lower cholesterol.  REDUCE RISK OF COLON CANCER  INDICATIONS THAT YOU NEED MORE FIBER  Constipation and hemorrhoids.   Uncomplicated diverticulosis (intestine condition) and irritable bowel syndrome.   Weight management.   As a protective measure against hardening of the arteries (atherosclerosis), diabetes, and  cancer.   GUIDELINES FOR INCREASING FIBER IN THE DIET  Start adding fiber to the diet slowly. A gradual increase of about 5 more grams (2 slices of whole-wheat bread, 2 servings of most fruits or vegetables, or 1 bowl of high-fiber cereal) per day is best. Too rapid an increase in fiber may result in constipation, flatulence, and bloating.   Drink enough water and fluids to keep your urine clear or pale yellow. Water, juice, or caffeine-free drinks are recommended. Not drinking enough fluid may cause constipation.   Eat a variety of high-fiber foods rather than one type of fiber.   Try to increase your intake of fiber through using high-fiber foods rather than fiber pills or supplements that contain small amounts of fiber.   The goal is to change the types of food eaten. Do not supplement your present diet with high-fiber foods, but replace foods in your present diet.   INCLUDE A VARIETY OF FIBER SOURCES  Replace refined and processed grains with whole grains, canned fruits with fresh fruits, and incorporate other fiber sources. White rice, white breads, and most bakery goods contain little or no fiber.   Brown whole-grain rice, buckwheat oats, and many fruits and vegetables are all good sources of fiber. These include: broccoli, Brussels sprouts, cabbage, cauliflower, beets, sweet potatoes, white potatoes (skin on), carrots, tomatoes, eggplant, squash, berries, fresh fruits, and dried fruits.   Cereals appear to be the richest source of fiber. Cereal fiber is found in whole grains and bran. Bran is the fiber-rich outer coat of cereal grain, which is largely removed in refining. In whole-grain cereals, the bran remains. In breakfast cereals, the largest amount of fiber is found in those with "bran" in their names. The fiber content is sometimes indicated on the label.   You may need to include additional fruits and vegetables each day.   In baking, for 1 cup white flour, you may use the  following substitutions:   1 cup whole-wheat flour minus 2 tablespoons.   1/2 cup white flour plus 1/2 cup whole-wheat flour.   Polyps, Colon  A polyp is extra tissue that grows inside your body. Colon polyps grow in the large intestine. The large intestine, also called the colon, is part of your digestive system. It is a long, hollow tube at the end of your digestive tract where your body makes and stores stool. Most polyps are not dangerous. They are benign. This means they are not cancerous. But over time, some types of polyps can turn into cancer. Polyps that are smaller than a pea are usually not harmful. But larger polyps could someday become or may already be cancerous. To be safe, doctors remove all polyps and test them.   PREVENTION There is not one sure way to prevent polyps. You might be able to lower your risk of getting them if you:  Eat more fruits and vegetables and less fatty food.   Do not smoke.   Avoid alcohol.  Exercise every day.   Lose weight if you are overweight.   Eating more calcium and folate can also lower your risk of getting polyps. Some foods that are rich in calcium are milk, cheese, and broccoli. Some foods that are rich in folate are chickpeas, kidney beans, and spinach.    Diverticulosis Diverticulosis is a common condition that develops when small pouches (diverticula) form in the wall of the colon. The risk of diverticulosis increases with age. It happens more often in people who eat a low-fiber diet. Most individuals with diverticulosis have no symptoms. Those individuals with symptoms usually experience belly (abdominal) pain, constipation, or loose stools (diarrhea).  HOME CARE INSTRUCTIONS  Increase the amount of fiber in your diet as directed by your caregiver or dietician. This may reduce symptoms of diverticulosis.   Drink at least 6 to 8 glasses of water each day to prevent constipation.   Try not to strain when you have a bowel  movement.   Avoiding nuts and seeds to prevent complications is NOT NECESSARY.     FOODS HAVING HIGH FIBER CONTENT INCLUDE:  Fruits. Apple, peach, pear, tangerine, raisins, prunes.   Vegetables. Brussels sprouts, asparagus, broccoli, cabbage, carrot, cauliflower, romaine lettuce, spinach, summer squash, tomato, winter squash, zucchini.   Starchy Vegetables. Baked beans, kidney beans, lima beans, split peas, lentils, potatoes (with skin).   Grains. Whole wheat bread, brown rice, bran flake cereal, plain oatmeal, white rice, shredded wheat, bran muffins.    SEEK IMMEDIATE MEDICAL CARE IF:  You develop increasing pain or severe bloating.   You have an oral temperature above 101F.   You develop vomiting or bowel movements that are bloody or black.   Hemorrhoids Hemorrhoids are dilated (enlarged) veins around the rectum. Sometimes clots will form in the veins. This makes them swollen and painful. These are called thrombosed hemorrhoids. Causes of hemorrhoids include:  Constipation.   Straining to have a bowel movement.   HEAVY LIFTING  HOME CARE INSTRUCTIONS  Eat a well balanced diet and drink 6 to 8 glasses of water every day to avoid constipation. You may also use a bulk laxative.   Avoid straining to have bowel movements.   Keep anal area dry and clean.   Do not use a donut shaped pillow or sit on the toilet for long periods. This increases blood pooling and pain.   Move your bowels when your body has the urge; this will require less straining and will decrease pain and pressure.

## 2016-04-19 NOTE — Op Note (Addendum)
[facility] Patient Name: Scott Burton Procedure Date: 04/19/2016 9:24 AM MRN: [id number] Date of Birth: 06-18-1935 Attending MD: Barney Drain , MD CSN: [id number] Age: 81 Admit Type: Outpatient Procedure:                Colonoscopy WITH SNARE POLYPECTOMY Indications:              Screening for colorectal malignant neoplasm Providers:                Barney Drain, MD, Janeece Riggers, RN, Rosina Lowenstein, RN Referring MD:             Modena Nunnery. Central Medicines:                Meperidine 75 mg IV, Midazolam 4 mg IV Complications:            No immediate complications. Estimated Blood Loss:     Estimated blood loss was minimal. Procedure:                Pre-Anesthesia Assessment:                           - Prior to the procedure, a History and Physical                            was performed, and patient medications and                            allergies were reviewed. The patient's tolerance of                            previous anesthesia was also reviewed. The risks                            and benefits of the procedure and the sedation                            options and risks were discussed with the patient.                            All questions were answered, and informed consent                            was obtained. Prior Anticoagulants: The patient has                            taken no previous anticoagulant or antiplatelet                            agents. ASA Grade Assessment: II - A patient with                            mild systemic disease. After reviewing the risks                            and benefits, the patient was deemed in  satisfactory condition to undergo the procedure.                            After obtaining informed consent, the colonoscope                            was passed under direct vision. Throughout the                            procedure, the patient's blood pressure, pulse, and       oxygen saturations were monitored continuously. The                            EC-[id number] ([id number]) scope was introduced through                            the anus and advanced to the the cecum, identified                            by appendiceal orifice and ileocecal valve. The                            colonoscopy was performed without difficulty. The                            patient tolerated the procedure well. The quality                            of the bowel preparation was excellent. The                            ileocecal valve, appendiceal orifice, and rectum                            were photographed. Scope In: 9:41:27 AM Scope Out: 9:56:53 AM Scope Withdrawal Time: 0 hours 12 minutes 55 seconds  Total Procedure Duration: 0 hours 15 minutes 26 seconds  Findings:      A 6 mm polyp was found in the cecum. The polyp was sessile. The polyp       was removed with a hot snare. Resection and retrieval were complete.      Multiple small and large-mouthed diverticula were found in the       recto-sigmoid colon and sigmoid colon.      Non-bleeding internal hemorrhoids were found during retroflexion. The       hemorrhoids were moderate.      The digital rectal exam findings include anal stricture. Pertinent       negatives include no anal lesion or abnormality was detected. Impression:               - One 6 mm polyp in the cecum, removed with a hot                            snare. Resected and retrieved.                           -  Diverticulosis in the recto-sigmoid colon and in                            the sigmoid colon.                           - Non-bleeding internal hemorrhoids. Moderate Sedation:      Moderate (conscious) sedation was administered by the endoscopy nurse       and supervised by the endoscopist. The following parameters were       monitored: oxygen saturation, heart rate, blood pressure, and response       to care. Total physician intraservice time  was 25 minutes. Recommendation:           - Repeat colonoscopy 10-15 YEARS IF THE BENEFITS                            OUTWEIGH THE RISKS for surveillance.                           - High fiber diet.                           - Continue present medications.                           - Await pathology results.                           - Patient has a contact number available for                            emergencies. The signs and symptoms of potential                            delayed complications were discussed with the                            patient. Return to normal activities tomorrow.                            Written discharge instructions were provided to the                            patient. Procedure Code(s):        --- Professional ---                           [phone], Colonoscopy, flexible; with removal of                            tumor(s), polyp(s), or other lesion(s) by snare                            technique                           99152, Moderate  sedation services provided by the                            same physician or other qualified health care                            professional performing the diagnostic or                            therapeutic service that the sedation supports,                            requiring the presence of an independent trained                            observer to assist in the monitoring of the                            patient's level of consciousness and physiological                            status; initial 15 minutes of intraservice time,                            patient age 51 years or older                           [phone], Moderate sedation services; each additional                            15 minutes intraservice time Diagnosis Code(s):        --- Professional ---                           Z12.11, Encounter for screening for malignant                            neoplasm of colon                            D12.0, Benign neoplasm of cecum                           K64.8, Other hemorrhoids                           K57.30, Diverticulosis of large intestine without                            perforation or abscess without bleeding CPT copyright 2016 [employer]. All rights reserved. The codes documented in this report are preliminary and upon coder review may  be revised to meet current compliance requirements. Barney Drain, MD Barney Drain, MD 04/19/2016 10:07:08 AM This report has been signed electronically. Number of Addenda: 0

## 2016-04-24 ENCOUNTER — Encounter (HOSPITAL_COMMUNITY): Payer: Self-pay | Admitting: Gastroenterology

## 2016-04-26 ENCOUNTER — Telehealth: Payer: Self-pay | Admitting: Gastroenterology

## 2016-04-26 NOTE — Telephone Encounter (Signed)
Please call pt. HE had ONE INFLAMMATORY POLYP adenomas removed from HIS colon. FOLLOW A HIGH FIBER DIET. TCS IN 10-15 YEARS IF THE BENEFITS OUTWEIGH THE RISKS.

## 2016-04-27 NOTE — Telephone Encounter (Signed)
Reminder in epic °

## 2016-04-27 NOTE — Telephone Encounter (Signed)
Tried to call with no answer  

## 2016-04-27 NOTE — Telephone Encounter (Signed)
Pt is aware.  

## 2016-05-08 ENCOUNTER — Ambulatory Visit (INDEPENDENT_AMBULATORY_CARE_PROVIDER_SITE_OTHER): Payer: Medicare HMO | Admitting: Family Medicine

## 2016-05-08 ENCOUNTER — Encounter: Payer: Self-pay | Admitting: Family Medicine

## 2016-05-08 VITALS — BP 128/64 | HR 74 | Temp 98.1°F | Resp 16 | Ht 66.0 in | Wt 181.0 lb

## 2016-05-08 DIAGNOSIS — N183 Chronic kidney disease, stage 3 unspecified: Secondary | ICD-10-CM

## 2016-05-08 DIAGNOSIS — I10 Essential (primary) hypertension: Secondary | ICD-10-CM | POA: Diagnosis not present

## 2016-05-08 DIAGNOSIS — J00 Acute nasopharyngitis [common cold]: Secondary | ICD-10-CM

## 2016-05-08 LAB — CBC WITH DIFFERENTIAL/PLATELET
BASOS PCT: 1 %
Basophils Absolute: 55 cells/uL (ref 0–200)
Eosinophils Absolute: 165 cells/uL (ref 15–500)
Eosinophils Relative: 3 %
HEMATOCRIT: 42.5 % (ref 38.5–50.0)
Hemoglobin: 13.6 g/dL (ref 13.0–17.0)
LYMPHS PCT: 39 %
Lymphs Abs: 2145 cells/uL (ref 850–3900)
MCH: 28 pg (ref 27.0–33.0)
MCHC: 32 g/dL (ref 32.0–36.0)
MCV: 87.4 fL (ref 80.0–100.0)
MPV: 11 fL (ref 7.5–12.5)
Monocytes Absolute: 330 cells/uL (ref 200–950)
Monocytes Relative: 6 %
Neutro Abs: 2805 cells/uL (ref 1500–7800)
Neutrophils Relative %: 51 %
Platelets: 264 10*3/uL (ref 140–400)
RBC: 4.86 MIL/uL (ref 4.20–5.80)
RDW: 13.9 % (ref 11.0–15.0)
WBC: 5.5 10*3/uL (ref 3.8–10.8)

## 2016-05-08 LAB — COMPREHENSIVE METABOLIC PANEL
ALK PHOS: 66 U/L (ref 40–115)
ALT: 9 U/L (ref 9–46)
AST: 14 U/L (ref 10–35)
Albumin: 4 g/dL (ref 3.6–5.1)
BUN: 15 mg/dL (ref 7–25)
CO2: 22 mmol/L (ref 20–31)
Calcium: 10.7 mg/dL — ABNORMAL HIGH (ref 8.6–10.3)
Chloride: 105 mmol/L (ref 98–110)
Creat: 1.52 mg/dL — ABNORMAL HIGH (ref 0.70–1.18)
Glucose, Bld: 91 mg/dL (ref 70–99)
POTASSIUM: 5 mmol/L (ref 3.5–5.3)
Sodium: 140 mmol/L (ref 135–146)
TOTAL PROTEIN: 7 g/dL (ref 6.1–8.1)
Total Bilirubin: 0.6 mg/dL (ref 0.2–1.2)

## 2016-05-08 NOTE — Patient Instructions (Addendum)
Okay to drink ensure Blood pressure is good We will call with  Lab results Claritin over the counter for any allergies  F/U 4 months Physical

## 2016-05-08 NOTE — Progress Notes (Signed)
   Subjective:    Patient ID: Scott Burton, male    DOB: [birth year], 81 y.o.   MRN: [id number]  Patient presents for Follow-up (pt granddaughter made appointment for weakness, but patient denies it)     About 2 weeks  He had a dizzy spell and weakness . He started drinking water and it went a way. No fever, no cough, no congestion,no chest pain, no SOB Today feels fine Taking his BP meds and cholesterol medication. States appetite is good. Occ drinks ensure  He had colonoscopy a few weeks ago, had 1 polyp    Review Of Systems:  GEN- denies fatigue, fever, weight loss,weakness, recent illness HEENT- denies eye drainage, change in vision, nasal discharge, CVS- denies chest pain, palpitations RESP- denies SOB, cough, wheeze ABD- denies N/V, change in stools, abd pain GU- denies dysuria, hematuria, dribbling, incontinence MSK- denies joint pain, muscle aches, injury Neuro- denies headache, +dizziness, syncope, seizure activity       Objective:    BP 128/64   Pulse 74   Temp 98.1 F (36.7 C) (Oral)   Resp 16   Ht 5\' 6"  (1.676 m)   Wt 181 lb (82.1 kg)   SpO2 98%   BMI 29.21 kg/m  GEN- NAD, alert and oriented x3 HEENT- PERRL, EOMI, non injected sclera, pink conjunctiva, MMM, oropharynx clear, sniffing during exam Neck- Supple, no thyromegaly, no bruit  CVS- RRR, 2/6 SEM RESP-CTAB Neuro-CNII-XII in tact, no deficits  EXT- No edema Pulses- Radial  2+        Assessment & Plan:      Problem List Items Addressed This Visit    Essential hypertension, benign    Controlled no changes to meds No current symptoms, unclear if he was a little dehydrated or had a vertigo spell Normal exam today  Check renal function/CBC Call back if symptoms return  He lives with Niece       Relevant Orders   CBC with Differential/Platelet   Comprehensive metabolic panel   CKD (chronic kidney disease) stage 3, GFR 30-59 ml/min - Primary    Other Visit Diagnoses    Acute rhinitis        pollen induced, advised he can use OTC allergy med, he state not too bad       Note: This dictation was prepared with Dragon dictation along with smaller phrase technology. Any transcriptional errors that result from this process are unintentional.

## 2016-05-08 NOTE — Assessment & Plan Note (Addendum)
Controlled no changes to meds No current symptoms, unclear if he was a little dehydrated or had a vertigo spell Normal exam today  Check renal function/CBC Call back if symptoms return  He lives with Niece

## 2016-05-09 ENCOUNTER — Other Ambulatory Visit: Payer: Self-pay | Admitting: Family Medicine

## 2016-05-10 ENCOUNTER — Encounter: Payer: Self-pay | Admitting: *Deleted

## 2016-06-11 ENCOUNTER — Emergency Department (HOSPITAL_COMMUNITY)
Admission: EM | Admit: 2016-06-11 | Discharge: 2016-06-11 | Disposition: A | Discharge status: Home / Self Care | Payer: Medicare HMO | Attending: Emergency Medicine | Admitting: Emergency Medicine

## 2016-06-11 ENCOUNTER — Encounter (HOSPITAL_COMMUNITY): Payer: Self-pay | Admitting: Emergency Medicine

## 2016-06-11 ENCOUNTER — Emergency Department (HOSPITAL_COMMUNITY): Payer: Medicare HMO

## 2016-06-11 DIAGNOSIS — N183 Chronic kidney disease, stage 3 (moderate): Secondary | ICD-10-CM | POA: Insufficient documentation

## 2016-06-11 DIAGNOSIS — Z87891 Personal history of nicotine dependence: Secondary | ICD-10-CM | POA: Insufficient documentation

## 2016-06-11 DIAGNOSIS — R531 Weakness: Secondary | ICD-10-CM

## 2016-06-11 DIAGNOSIS — R42 Dizziness and giddiness: Secondary | ICD-10-CM | POA: Insufficient documentation

## 2016-06-11 DIAGNOSIS — Z79899 Other long term (current) drug therapy: Secondary | ICD-10-CM | POA: Diagnosis not present

## 2016-06-11 DIAGNOSIS — I129 Hypertensive chronic kidney disease with stage 1 through stage 4 chronic kidney disease, or unspecified chronic kidney disease: Secondary | ICD-10-CM | POA: Diagnosis not present

## 2016-06-11 LAB — BASIC METABOLIC PANEL WITH GFR
Anion gap: 8 (ref 5–15)
BUN: 13 mg/dL (ref 6–20)
CO2: 27 mmol/L (ref 22–32)
Calcium: 9.8 mg/dL (ref 8.9–10.3)
Chloride: 104 mmol/L (ref 101–111)
Creatinine, Ser: 1.37 mg/dL — ABNORMAL HIGH (ref 0.61–1.24)
GFR calc Af Amer: 55 mL/min — ABNORMAL LOW (ref >60)
GFR calc non Af Amer: 48 mL/min — ABNORMAL LOW (ref >60)
Glucose, Bld: 130 mg/dL — ABNORMAL HIGH (ref 65–99)
Potassium: 3.8 mmol/L (ref 3.5–5.1)
Sodium: 139 mmol/L (ref 135–145)

## 2016-06-11 LAB — CBC WITH DIFFERENTIAL/PLATELET
Basophils Absolute: 0 K/uL (ref 0.0–0.1)
Basophils Relative: 0 %
Eosinophils Absolute: 0.2 K/uL (ref 0.0–0.7)
Eosinophils Relative: 2 %
HCT: 37.5 % — ABNORMAL LOW (ref 39.0–52.0)
Hemoglobin: 12.5 g/dL — ABNORMAL LOW (ref 13.0–17.0)
Lymphocytes Relative: 36 %
Lymphs Abs: 2.3 K/uL (ref 0.7–4.0)
MCH: 28.9 pg (ref 26.0–34.0)
MCHC: 33.3 g/dL (ref 30.0–36.0)
MCV: 86.6 fL (ref 78.0–100.0)
Monocytes Absolute: 0.3 K/uL (ref 0.1–1.0)
Monocytes Relative: 5 %
Neutro Abs: 3.6 K/uL (ref 1.7–7.7)
Neutrophils Relative %: 57 %
Platelets: 194 K/uL (ref 150–400)
RBC: 4.33 MIL/uL (ref 4.22–5.81)
RDW: 13.5 % (ref 11.5–15.5)
WBC: 6.4 K/uL (ref 4.0–10.5)

## 2016-06-11 LAB — URINALYSIS, ROUTINE W REFLEX MICROSCOPIC
Bilirubin Urine: NEGATIVE
Glucose, UA: NEGATIVE mg/dL
Hgb urine dipstick: NEGATIVE
Ketones, ur: NEGATIVE mg/dL
Nitrite: NEGATIVE
Protein, ur: NEGATIVE mg/dL
Specific Gravity, Urine: 1.006 (ref 1.005–1.030)
pH: 6 (ref 5.0–8.0)

## 2016-06-11 MED ORDER — SODIUM CHLORIDE 0.9 % IV BOLUS (SEPSIS)
1000.0000 mL | Freq: Once | INTRAVENOUS | Status: AC
  Administered 2016-06-11: 1000 mL via INTRAVENOUS

## 2016-06-11 MED ORDER — MECLIZINE HCL 12.5 MG PO TABS: 12.5000 mg | ORAL_TABLET | Freq: Two times a day (BID) | ORAL | 0 refills | Status: DC | PRN

## 2016-06-11 NOTE — Discharge Instructions (Signed)
Tests showed no life-threatening condition. Medication for dizziness. Follow-up your primary care doctor.

## 2016-06-11 NOTE — ED Notes (Signed)
IV site D/C'd , cath intact. bandaid applied.

## 2016-06-11 NOTE — ED Provider Notes (Signed)
[facility] DEPT Provider Note   CSN: [id number] Arrival date & time: 06/11/16  1721     History   Chief Complaint Chief Complaint  Patient presents with  . Weakness    HPI Scott Burton is a 81 y.o. male.  Patient presents with dizziness and weakness since this morning. Daughter said he almost "blacked out". He also feels lightheaded. He feels back to normal now. No substernal chest pain, dyspnea, diaphoresis, dysuria, neurological deficits, fever, sweats, chills. Severity of symptoms is mild. Nothing makes symptoms better or worse.      Past Medical History:  Diagnosis Date  . Arthritis   . CKD (chronic kidney disease), stage III   . GERD (gastroesophageal reflux disease)   . Gout   . Hyperlipidemia   . Hypertension     Patient Active Problem List   Diagnosis Date Noted  . Special screening for malignant neoplasms, colon   . Gout 08/10/2015  . CKD (chronic kidney disease) stage 3, GFR 30-59 ml/min 04/26/2015  . Loss of weight 04/26/2015  . Hyperlipidemia 12/25/2014  . Essential hypertension, benign 09/25/2014  . Insomnia 09/25/2014    Past Surgical History:  Procedure Laterality Date  . COLONOSCOPY N/A 04/19/2016   Procedure: COLONOSCOPY;  Surgeon: Danie Binder, MD;  Location: [facility];  Service: Endoscopy;  Laterality: N/A;  1130   . NO PAST SURGERIES         Home Medications    Prior to Admission medications   Medication Sig Start Date End Date Taking? Authorizing Provider  atorvastatin (LIPITOR) 10 MG tablet TAKE 1 TABLET(10 MG) BY MOUTH DAILY 05/09/16  Yes [place], Modena Nunnery, MD  losartan (COZAAR) 50 MG tablet Take 1 tablet (50 mg total) by mouth daily. Patient taking differently: Take 50 mg by mouth daily before breakfast.  01/24/16  Yes [place], Modena Nunnery, MD  meclizine (ANTIVERT) 12.5 MG tablet Take 1 tablet (12.5 mg total) by mouth 2 (two) times daily as needed for dizziness. 06/11/16   Nat Christen, MD    Family History Family History    Problem Relation Age of Onset  . Cancer Father   . Dementia Mother     Social History Social History  Substance Use Topics  . Smoking status: Former Smoker    Quit date: 01/17/1995  . Smokeless tobacco: Former Systems developer    Quit date: 01/28/1991  . Alcohol use No     Allergies   Patient has no known allergies.   Review of Systems Review of Systems  All other systems reviewed and are negative.    Physical Exam Updated Vital Signs BP (!) 163/93   Pulse 74   Temp 98.3 F (36.8 C) (Oral)   Resp 17   Ht 5\' 6"  (1.676 m)   Wt 85.7 kg (189 lb)   SpO2 98%   BMI 30.51 kg/m   Physical Exam  Constitutional: He is oriented to person, place, and time. He appears well-developed and well-nourished.  HENT:  Head: Normocephalic and atraumatic.  Eyes: Conjunctivae are normal.  Neck: Neck supple.  Cardiovascular: Normal rate and regular rhythm.   Pulmonary/Chest: Effort normal and breath sounds normal.  Abdominal: Soft. Bowel sounds are normal.  Musculoskeletal: Normal range of motion.  Neurological: He is alert and oriented to person, place, and time.  Skin: Skin is warm and dry.  Psychiatric: He has a normal mood and affect. His behavior is normal.  Nursing note and vitals reviewed.    ED Treatments / Results  Labs (all labs ordered are listed, but only abnormal results are displayed) Labs Reviewed  CBC WITH DIFFERENTIAL/PLATELET - Abnormal; Notable for the following:       Result Value   Hemoglobin 12.5 (*)    HCT 37.5 (*)    All other components within normal limits  BASIC METABOLIC PANEL - Abnormal; Notable for the following:    Glucose, Bld 130 (*)    Creatinine, Ser 1.37 (*)    GFR calc non Af Amer 48 (*)    GFR calc Af Amer 55 (*)    All other components within normal limits  URINALYSIS, ROUTINE W REFLEX MICROSCOPIC - Abnormal; Notable for the following:    Leukocytes, UA LARGE (*)    Bacteria, UA RARE (*)    Squamous Epithelial / LPF 0-5 (*)    All other  components within normal limits    EKG  EKG Interpretation  Date/Time:  Sunday Jun 11 2016 17:42:01 EDT Ventricular Rate:  78 PR Interval:    QRS Duration: 123 QT Interval:  393 QTC Calculation: 448 R Axis:   -65 Text Interpretation:  Sinus rhythm RBBB and LAFB LVH by voltage Borderline ST elevation, lateral leads Baseline wander in lead(s) V5 Confirmed by Evone Arseneau  MD, Virlee Stroschein ([id number]) on 06/11/2016 6:12:42 PM       Radiology Ct Head Wo Contrast  Result Date: 06/11/2016 CLINICAL DATA:  Dizziness EXAM: CT HEAD WITHOUT CONTRAST TECHNIQUE: Contiguous axial images were obtained from the base of the skull through the vertex without intravenous contrast. COMPARISON:  08/06/2014 FINDINGS: Brain: Very mild atrophic changes are noted. No findings to suggest acute hemorrhage or space-occupying mass lesion are seen. Scattered areas of decreased attenuation are noted within the cerebellar hemispheres bilaterally consistent with prominent sulci. Vascular: No hyperdense vessel or unexpected calcification. Skull: Normal. Negative for fracture or focal lesion. Sinuses/Orbits: No acute finding. Other: None. IMPRESSION: Atrophic changes without acute abnormality. Electronically Signed   By: Inez Catalina M.D.   On: 06/11/2016 19:05    Procedures Procedures (including critical care time)  Medications Ordered in ED Medications  sodium chloride 0.9 % bolus 1,000 mL (1,000 mLs Intravenous New Bag/Given 06/11/16 1845)     Initial Impression / Assessment and Plan / ED Course  I have reviewed the triage vital signs and the nursing notes.  Pertinent labs & imaging results that were available during my care of the patient were reviewed by me and considered in my medical decision making (see chart for details).     Patient is alert and oriented 3 without neurological deficits. He has a normal physical exam. Screening tests including EKG and CT head were negative. Discharge medication Antivert 12.5 mg.  Final  Clinical Impressions(s) / ED Diagnoses   Final diagnoses:  Weakness  Vertigo    New Prescriptions New Prescriptions   MECLIZINE (ANTIVERT) 12.5 MG TABLET    Take 1 tablet (12.5 mg total) by mouth 2 (two) times daily as needed for dizziness.     Nat Christen, MD 06/11/16 [phone]

## 2016-06-11 NOTE — ED Triage Notes (Signed)
Dizzy and weakness that started this morning

## 2016-06-13 ENCOUNTER — Telehealth: Payer: Self-pay | Admitting: *Deleted

## 2016-06-13 LAB — URINE CULTURE: Culture: 40000 — AB

## 2016-06-13 NOTE — Telephone Encounter (Signed)
Received call from patient granddaughter [name].   States that patient was seen in ER for vertigo and weakness. States that he was given Meclizine for vertigo, but patient has not improved.   Call placed to patient to inquire. [place].

## 2016-06-13 NOTE — Telephone Encounter (Signed)
Patient granddaughter returned call.   Appointment scheduled.

## 2016-06-14 ENCOUNTER — Telehealth: Payer: Self-pay | Admitting: Emergency Medicine

## 2016-06-14 NOTE — Telephone Encounter (Signed)
Post ED Visit - Positive Culture Follow-up  Culture report reviewed by antimicrobial stewardship pharmacist:  []  Elenor Quinones, Pharm.D. [x]  Heide Guile, Pharm.D., BCPS AQ-ID []  Parks Neptune, Pharm.D., BCPS []  [name], Pharm.D., BCPS []  [place], Pharm.D., BCPS, AAHIVP []  [name], Pharm.D., BCPS, AAHIVP []  Salome Arnt, PharmD, BCPS []  Dimitri Ped, PharmD, BCPS []  Vincenza Hews, PharmD, BCPS  Positive urine culture Treated with none, asymptomatic, no further patient follow-up is required at this time.  [name] 06/14/2016, 11:34 AM

## 2016-06-16 ENCOUNTER — Encounter: Payer: Self-pay | Admitting: Family Medicine

## 2016-06-16 ENCOUNTER — Ambulatory Visit (INDEPENDENT_AMBULATORY_CARE_PROVIDER_SITE_OTHER): Payer: Medicare HMO | Admitting: Family Medicine

## 2016-06-16 VITALS — BP 130/68 | HR 68 | Temp 98.5°F | Resp 16 | Ht 66.0 in | Wt 182.0 lb

## 2016-06-16 DIAGNOSIS — N183 Chronic kidney disease, stage 3 unspecified: Secondary | ICD-10-CM

## 2016-06-16 DIAGNOSIS — N3281 Overactive bladder: Secondary | ICD-10-CM | POA: Diagnosis not present

## 2016-06-16 DIAGNOSIS — I10 Essential (primary) hypertension: Secondary | ICD-10-CM

## 2016-06-16 DIAGNOSIS — R350 Frequency of micturition: Secondary | ICD-10-CM | POA: Diagnosis not present

## 2016-06-16 DIAGNOSIS — N390 Urinary tract infection, site not specified: Secondary | ICD-10-CM

## 2016-06-16 LAB — URINALYSIS, ROUTINE W REFLEX MICROSCOPIC
BILIRUBIN URINE: NEGATIVE
GLUCOSE, UA: NEGATIVE
HGB URINE DIPSTICK: NEGATIVE
Ketones, ur: NEGATIVE
Nitrite: NEGATIVE
PROTEIN: NEGATIVE
Specific Gravity, Urine: 1.01 (ref 1.001–1.035)
pH: 5.5 (ref 5.0–8.0)

## 2016-06-16 LAB — URINALYSIS, MICROSCOPIC ONLY
Bacteria, UA: NONE SEEN [HPF]
Casts: NONE SEEN [LPF]
Crystals: NONE SEEN [HPF]
RBC / HPF: NONE SEEN RBC/HPF (ref <=2)
YEAST: NONE SEEN [HPF]

## 2016-06-16 MED ORDER — LOSARTAN POTASSIUM 50 MG PO TABS: 25.0000 mg | ORAL_TABLET | Freq: Every day | ORAL | 3 refills | Status: DC

## 2016-06-16 MED ORDER — MIRABEGRON ER 25 MG PO TB24: 25.0000 mg | ORAL_TABLET | Freq: Every day | ORAL | 0 refills | Status: DC

## 2016-06-16 MED ORDER — CIPROFLOXACIN HCL 250 MG PO TABS: 250.0000 mg | ORAL_TABLET | Freq: Two times a day (BID) | ORAL | 0 refills | Status: DC

## 2016-06-16 NOTE — Assessment & Plan Note (Signed)
BP with mild orthostasis though symptoms resolved will decrease to 1/2 tablet of BP med and monitor him

## 2016-06-16 NOTE — Progress Notes (Signed)
   Subjective:    Patient ID: Scott Burton, male    DOB: 10/16/1935, 81 y.o.   MRN: [id number]  Patient presents for Vertigo (states that he got dizzy while sitting up on the side of the bed last week- has been seen in ER)  Patient here for ER follow-up. He was ER after he had driven home he sat down and tries to get up quickly and felt dizzy. This scared him and he went to the ER. CT of head was negative last surveillance unremarkable he did have a urinalysis done which showed strep agalactiae is thought that this is likely contaminant at the time. He has not had any further dizzy episodes. He was prescribed meclizine states that he took a couple pills in the loss of prescription. He still been taking his blood pressure medicine as prescribed. He feels fine now. He is here today with his grandson. Noted they are concerned about his urinary frequency states it's been going on for many months but at times he cannot make it to the restroom. He denies any burning or pressure. Denies any change in his bowels.  Review Of Systems:  GEN- denies fatigue, fever, weight loss,weakness, recent illness HEENT- denies eye drainage, change in vision, nasal discharge, CVS- denies chest pain, palpitations RESP- denies SOB, cough, wheeze ABD- denies N/V, change in stools, abd pain GU- denies dysuria, hematuria, dribbling, incontinence MSK- denies joint pain, muscle aches, injury Neuro- denies headache, dizziness, syncope, seizure activity       Objective:    BP 130/68   Pulse 68   Temp 98.5 F (36.9 C) (Oral)   Resp 16   Ht 5\' 6"  (1.676 m)   Wt 182 lb (82.6 kg)   SpO2 98%   BMI 29.38 kg/m  GEN- NAD, alert and oriented x3 HEENT- PERRL, EOMI, non injected sclera, pink conjunctiva, MMM, oropharynx clear Neck- Supple, no Bruit CVS- RRR, 2/6 SEM RESP-CTAB ABD-NABS,soft,NT,ND NEURO-CNII-XII intact, no change in deficits EXT- No edema Pulses- Radial, DP- 2+  EKG- NSR, RBBB, left bifasicular block    Orthostatics- 130/68 lying    Sitting 122/60 standing 118/60      Assessment & Plan:      Problem List Items Addressed This Visit    Essential hypertension, benign - Primary    BP with mild orthostasis though symptoms resolved will decrease to 1/2 tablet of BP med and monitor him      Relevant Medications   losartan (COZAAR) 50 MG tablet   Other Relevant Orders   EKG 12-Lead (Completed)   CKD (chronic kidney disease) stage 3, GFR 30-59 ml/min    Other Visit Diagnoses    Urinary tract infection without hematuria, site unspecified       Concern for symptomtic UTI, given Cipro 250mg  BID x 5 days, repeat culture   Relevant Orders   Urinalysis, Routine w reflex microscopic (Completed)   Urine culture   OAB (overactive bladder)       oab SIGNS, not BPH symptoms really described, given Myrbetriq to try after antibiotics      Note: This dictation was prepared with Dragon dictation along with smaller phrase technology. Any transcriptional errors that result from this process are unintentional.

## 2016-06-16 NOTE — Patient Instructions (Addendum)
Decrease blood pressure medication to 1/2 tablet daily  Try the myrbetric after he takes the antibiotics Call if this works for him F/U as previous

## 2016-06-17 LAB — URINE CULTURE: Organism ID, Bacteria: NO GROWTH

## 2016-07-26 ENCOUNTER — Other Ambulatory Visit: Payer: Self-pay | Admitting: *Deleted

## 2016-07-26 MED ORDER — ALLOPURINOL 100 MG PO TABS: 100.0000 mg | ORAL_TABLET | Freq: Every day | ORAL | 6 refills | Status: DC

## 2016-07-26 NOTE — Telephone Encounter (Signed)
Received call from patient granddaughter [name].   Reports that patient has swelling and pain in joint associated with gout. Requested refill on Alllopurinol.   Prescription sent to pharmacy. Discussed low purine diet: avoid organ meats, red meats, alcohol, spinach, asparagus, artificial sweeteners. Verbalized understanding.

## 2016-09-06 ENCOUNTER — Other Ambulatory Visit: Payer: Self-pay | Admitting: Family Medicine

## 2016-09-06 DIAGNOSIS — N183 Chronic kidney disease, stage 3 unspecified: Secondary | ICD-10-CM

## 2016-09-06 DIAGNOSIS — Z0000 Encounter for general adult medical examination without abnormal findings: Secondary | ICD-10-CM

## 2016-09-06 DIAGNOSIS — I10 Essential (primary) hypertension: Secondary | ICD-10-CM

## 2016-09-06 DIAGNOSIS — E785 Hyperlipidemia, unspecified: Secondary | ICD-10-CM

## 2016-09-06 DIAGNOSIS — Z125 Encounter for screening for malignant neoplasm of prostate: Secondary | ICD-10-CM

## 2016-09-06 DIAGNOSIS — Z79899 Other long term (current) drug therapy: Secondary | ICD-10-CM

## 2016-09-07 ENCOUNTER — Other Ambulatory Visit: Payer: Medicare HMO

## 2016-09-07 DIAGNOSIS — Z79899 Other long term (current) drug therapy: Secondary | ICD-10-CM | POA: Diagnosis not present

## 2016-09-07 DIAGNOSIS — Z125 Encounter for screening for malignant neoplasm of prostate: Secondary | ICD-10-CM | POA: Diagnosis not present

## 2016-09-07 DIAGNOSIS — Z0000 Encounter for general adult medical examination without abnormal findings: Secondary | ICD-10-CM

## 2016-09-07 DIAGNOSIS — I10 Essential (primary) hypertension: Secondary | ICD-10-CM

## 2016-09-07 DIAGNOSIS — E785 Hyperlipidemia, unspecified: Secondary | ICD-10-CM | POA: Diagnosis not present

## 2016-09-07 DIAGNOSIS — N183 Chronic kidney disease, stage 3 unspecified: Secondary | ICD-10-CM

## 2016-09-07 LAB — CBC WITH DIFFERENTIAL/PLATELET
BASOS ABS: 66 {cells}/uL (ref 0–200)
Basophils Relative: 1 %
EOS ABS: 264 {cells}/uL (ref 15–500)
Eosinophils Relative: 4 %
HEMATOCRIT: 42.1 % (ref 38.5–50.0)
Hemoglobin: 13.4 g/dL (ref 13.0–17.0)
LYMPHS PCT: 32 %
Lymphs Abs: 2112 cells/uL (ref 850–3900)
MCH: 27.9 pg (ref 27.0–33.0)
MCHC: 31.8 g/dL — AB (ref 32.0–36.0)
MCV: 87.7 fL (ref 80.0–100.0)
MONO ABS: 330 {cells}/uL (ref 200–950)
MPV: 10.2 fL (ref 7.5–12.5)
Monocytes Relative: 5 %
NEUTROS PCT: 58 %
Neutro Abs: 3828 cells/uL (ref 1500–7800)
Platelets: 252 10*3/uL (ref 140–400)
RBC: 4.8 MIL/uL (ref 4.20–5.80)
RDW: 14.2 % (ref 11.0–15.0)
WBC: 6.6 10*3/uL (ref 3.8–10.8)

## 2016-09-08 LAB — COMPLETE METABOLIC PANEL WITH GFR
ALBUMIN: 4 g/dL (ref 3.6–5.1)
ALK PHOS: 76 U/L (ref 40–115)
ALT: 12 U/L (ref 9–46)
AST: 16 U/L (ref 10–35)
BILIRUBIN TOTAL: 0.5 mg/dL (ref 0.2–1.2)
BUN: 14 mg/dL (ref 7–25)
CALCIUM: 10.8 mg/dL — AB (ref 8.6–10.3)
CO2: 20 mmol/L (ref 20–32)
CREATININE: 1.5 mg/dL — AB (ref 0.70–1.18)
Chloride: 105 mmol/L (ref 98–110)
GFR, Est African American: 51 mL/min — ABNORMAL LOW (ref >=60)
GFR, Est Non African American: 44 mL/min — ABNORMAL LOW (ref >=60)
Glucose, Bld: 113 mg/dL — ABNORMAL HIGH (ref 70–99)
Potassium: 4.6 mmol/L (ref 3.5–5.3)
Sodium: 142 mmol/L (ref 135–146)
TOTAL PROTEIN: 7.1 g/dL (ref 6.1–8.1)

## 2016-09-08 LAB — LIPID PANEL
Cholesterol: 229 mg/dL — ABNORMAL HIGH (ref <200)
HDL: 39 mg/dL — ABNORMAL LOW (ref >40)
LDL CALC: 147 mg/dL — AB (ref <100)
TRIGLYCERIDES: 216 mg/dL — AB (ref <150)
Total CHOL/HDL Ratio: 5.9 Ratio — ABNORMAL HIGH (ref <5.0)
VLDL: 43 mg/dL — ABNORMAL HIGH (ref <30)

## 2016-09-08 LAB — PSA: PSA: 3.4 ng/mL (ref <=4.0)

## 2016-09-11 ENCOUNTER — Encounter: Payer: Commercial Managed Care - HMO | Admitting: Family Medicine

## 2016-09-14 ENCOUNTER — Encounter: Payer: Self-pay | Admitting: Family Medicine

## 2016-09-14 ENCOUNTER — Ambulatory Visit (INDEPENDENT_AMBULATORY_CARE_PROVIDER_SITE_OTHER): Payer: Medicare Other | Admitting: Family Medicine

## 2016-09-14 VITALS — BP 138/68 | HR 74 | Temp 98.6°F | Resp 14 | Ht 66.0 in | Wt 179.0 lb

## 2016-09-14 DIAGNOSIS — I10 Essential (primary) hypertension: Secondary | ICD-10-CM

## 2016-09-14 DIAGNOSIS — N183 Chronic kidney disease, stage 3 unspecified: Secondary | ICD-10-CM

## 2016-09-14 DIAGNOSIS — Z0000 Encounter for general adult medical examination without abnormal findings: Secondary | ICD-10-CM

## 2016-09-14 DIAGNOSIS — E782 Mixed hyperlipidemia: Secondary | ICD-10-CM | POA: Diagnosis not present

## 2016-09-14 DIAGNOSIS — H6121 Impacted cerumen, right ear: Secondary | ICD-10-CM | POA: Diagnosis not present

## 2016-09-14 MED ORDER — ATORVASTATIN CALCIUM 20 MG PO TABS: 20.0000 mg | ORAL_TABLET | Freq: Every day | ORAL | 2 refills | Status: DC

## 2016-09-14 MED ORDER — MIRABEGRON ER 25 MG PO TB24: 25.0000 mg | ORAL_TABLET | Freq: Every day | ORAL | 1 refills | Status: DC

## 2016-09-14 NOTE — Patient Instructions (Addendum)
For bladder restart the myrbetriq For cholesterol it is too high, lipitor increased to 20mg   For blood pressure take 1/2 tablet of losartan COntinue gout pill Labs attached  Review the advanced directives Recommend flu shot in 2 weeks His hearing is bad, I can refer him for hearing aides if you think he will wear them F/U 4 months

## 2016-09-14 NOTE — Progress Notes (Signed)
Subjective:   Patient presents for Medicare Annual/Subsequent preventive examination.   Pt here with great grandson No concerns He does not have meds, we called his grandaughter , he has been taking everything except the myrbetriq, he needs script for this Fasting labs reviewed   Review Past Medical/Family/Social: Per EMR    Risk Factors  Current exercise habits: walks some  Dietary issues discussed: Yes  Cardiac risk factors: HTN, Hyperlipidemia  Depression Screen  (Note: if answer to either of the following is "Yes", a more complete depression screening is indicated)  Over the past two weeks, have you felt down, depressed or hopeless? No Over the past two weeks, have you felt little interest or pleasure in doing things? No Have you lost interest or pleasure in daily life? No Do you often feel hopeless? No Do you cry easily over simple problems? No   Activities of Daily Living  In your present state of health, do you have any difficulty performing the following activities?:  Driving? No  Managing money? No  Feeding yourself? Yes- preparing meals  Getting from bed to chair? No  Climbing a flight of stairs? No  Preparing food and eating?: No  Bathing or showering? No  Getting dressed: No  Getting to the toilet? No  Using the toilet:No  Moving around from place to place: No  In the past year have you fallen or had a near fall?:No  Are you sexually active? No  Do you have more than one partner? No   Hearing Difficulties: yes Do you often ask people to speak up or repeat themselves? yes  Do you experience ringing or noises in your ears? No Do you have difficulty understanding soft or whispered voices? yes  Do you feel that you have a problem with memory? yes Do you often misplace items? No  Do you feel safe at home? Yes  Cognitive Testing  Alert? Yes Normal Appearance?Yes  Oriented to person? Yes Place? Yes  Time? Yes  Recall of three objects? Yes  Can perform simple  calculations? Yes  Displays appropriate judgment?Yes  Can read the correct time from a watch face?Yes   List the Names of Other Physician/Practitioners you currently use:  NONE   Screening Tests / Date Colonoscopy   maxed age                   Zostavax UTD per pt  Pneumonia vaccines- UTD   ROS:  GEN- denies fatigue, fever, weight loss,weakness, recent illness HEENT- denies eye drainage, change in vision, nasal discharge, CVS- denies chest pain, palpitations RESP- denies SOB, cough, wheeze ABD- denies N/V, change in stools, abd pain GU- denies dysuria, hematuria, dribbling, incontinence MSK- denies joint pain, muscle aches, injury Neuro- denies headache, dizziness, syncope, seizure activity   PHYSICAL GEN- NAD, alert and oriented x3 HEENT- PERRL, EOMI, non injected sclera, pink conjunctiva, MMM, oropharynx clear, Right canal impacted wax, left canal clear  S/p irrigation TM clear bilat  Neck- Supple, no thryomegaly CVS- RRR,  2/6 SEM RESP-CTAB ABD-NABS,soft, NT,ND EXT- No edema Pulses- Radial, DP- 2+    Assessment:    Annual wellness medicare exam   Plan:    During the course of the visit the patient was educated and counseled about appropriate screening and preventive services including:  Flu shot in 2 weeks   Screen Neg for depression.    Hearing screen failed, but he did not want referral to [facility], this is to be discussed with grand daughter  Given advanced directives information   Hyperlipidemia- uncontrolled, lipitor increaed to 20mg  daily  HTN- controlled on 25mg  of losartan  OAB- restart myrbetriq he feels it helped    Discussed at drinking supplements if he does not want to eat, avoid weight loss    Diet review for nutrition referral? Yes ____ Not Indicated __x__  Patient Instructions (the written plan) was given to the patient.  Medicare Attestation  I have personally reviewed:  The patient's medical and social history  Their use of alcohol,  tobacco or illicit drugs  Their current medications and supplements  The patient's functional ability including ADLs,fall risks, home safety risks, cognitive, and hearing and visual impairment  Diet and physical activities  Evidence for depression or mood disorders  The patient's weight, height, BMI, and visual acuity have been recorded in the chart. I have made referrals, counseling, and provided education to the patient based on review of the above and I have provided the patient with a written personalized care plan for preventive services.

## 2016-10-05 ENCOUNTER — Other Ambulatory Visit: Payer: Self-pay | Admitting: Family Medicine

## 2016-11-20 ENCOUNTER — Other Ambulatory Visit: Payer: Self-pay | Admitting: Family Medicine

## 2017-01-15 ENCOUNTER — Ambulatory Visit (INDEPENDENT_AMBULATORY_CARE_PROVIDER_SITE_OTHER): Payer: Medicare Other | Admitting: Family Medicine

## 2017-01-15 ENCOUNTER — Other Ambulatory Visit: Payer: Self-pay

## 2017-01-15 ENCOUNTER — Encounter: Payer: Self-pay | Admitting: Family Medicine

## 2017-01-15 VITALS — BP 132/80 | HR 86 | Temp 97.9°F | Resp 14 | Ht 66.0 in | Wt 186.0 lb

## 2017-01-15 DIAGNOSIS — Z23 Encounter for immunization: Secondary | ICD-10-CM

## 2017-01-15 DIAGNOSIS — E782 Mixed hyperlipidemia: Secondary | ICD-10-CM

## 2017-01-15 DIAGNOSIS — I10 Essential (primary) hypertension: Secondary | ICD-10-CM

## 2017-01-15 DIAGNOSIS — N183 Chronic kidney disease, stage 3 unspecified: Secondary | ICD-10-CM

## 2017-01-15 LAB — LIPID PANEL
CHOLESTEROL: 153 mg/dL (ref <200)
HDL: 37 mg/dL — ABNORMAL LOW (ref >40)
LDL Cholesterol (Calc): 88 mg/dL (calc)
Non-HDL Cholesterol (Calc): 116 mg/dL (calc) (ref <130)
TRIGLYCERIDES: 179 mg/dL — AB (ref <150)
Total CHOL/HDL Ratio: 4.1 (calc) (ref <5.0)

## 2017-01-15 LAB — CBC WITH DIFFERENTIAL/PLATELET
BASOS ABS: 73 {cells}/uL (ref 0–200)
Basophils Relative: 1.2 %
EOS ABS: 201 {cells}/uL (ref 15–500)
EOS PCT: 3.3 %
HEMATOCRIT: 42.5 % (ref 38.5–50.0)
Hemoglobin: 14.2 g/dL (ref 13.2–17.1)
LYMPHS ABS: 2062 {cells}/uL (ref 850–3900)
MCH: 28.6 pg (ref 27.0–33.0)
MCHC: 33.4 g/dL (ref 32.0–36.0)
MCV: 85.7 fL (ref 80.0–100.0)
MPV: 11.2 fL (ref 7.5–12.5)
Monocytes Relative: 5.8 %
NEUTROS PCT: 55.9 %
Neutro Abs: 3410 cells/uL (ref 1500–7800)
Platelets: 218 10*3/uL (ref 140–400)
RBC: 4.96 10*6/uL (ref 4.20–5.80)
RDW: 12.8 % (ref 11.0–15.0)
Total Lymphocyte: 33.8 %
WBC: 6.1 10*3/uL (ref 3.8–10.8)
WBCMIX: 354 {cells}/uL (ref 200–950)

## 2017-01-15 LAB — COMPREHENSIVE METABOLIC PANEL
AG RATIO: 1.5 (calc) (ref 1.0–2.5)
ALKALINE PHOSPHATASE (APISO): 67 U/L (ref 40–115)
ALT: 16 U/L (ref 9–46)
AST: 15 U/L (ref 10–35)
Albumin: 4 g/dL (ref 3.6–5.1)
BILIRUBIN TOTAL: 0.6 mg/dL (ref 0.2–1.2)
BUN/Creatinine Ratio: 10 (calc) (ref 6–22)
BUN: 14 mg/dL (ref 7–25)
CALCIUM: 10 mg/dL (ref 8.6–10.3)
CO2: 27 mmol/L (ref 20–32)
Chloride: 105 mmol/L (ref 98–110)
Creat: 1.46 mg/dL — ABNORMAL HIGH (ref 0.70–1.18)
Globulin: 2.7 g/dL (calc) (ref 1.9–3.7)
Glucose, Bld: 94 mg/dL (ref 65–99)
Potassium: 4.4 mmol/L (ref 3.5–5.3)
SODIUM: 139 mmol/L (ref 135–146)
TOTAL PROTEIN: 6.7 g/dL (ref 6.1–8.1)

## 2017-01-15 MED ORDER — ALLOPURINOL 100 MG PO TABS: 100.0000 mg | ORAL_TABLET | Freq: Every day | ORAL | 2 refills | Status: DC

## 2017-01-15 NOTE — Assessment & Plan Note (Signed)
She is well controlled on the lower dose of the losartan.  We will check his fasting labs.  We will contact his granddaughter who dispenses his medications if any changes need to be made. Flu shot given

## 2017-01-15 NOTE — Progress Notes (Signed)
   Subjective:    Patient ID: Scott Burton, male    DOB: 20-Jun-1935, 81 y.o.   MRN: [id number]  Patient presents for Follow-up (is not fasting)   Pt here to f/u chronic medical problems. States he feels well. He is alone today   Grand-daughter still dispenses his medications  He has not been working out in the yard, he signed up for [employer] but never went ,staters he felt a little dizzy at times, but none currently , has felt good past 2 months  Appetite is good,bowels are good    Hyperlipidemia currently on lipitor, LDL was 147, TG  216, TC 229, he takes medications most days   HTN- taking 1/2 tablet of losartan   Due for Flu shot   Review Of Systems:  GEN- denies fatigue, fever, weight loss,weakness, recent illness HEENT- denies eye drainage, change in vision, nasal discharge, CVS- denies chest pain, palpitations RESP- denies SOB, cough, wheeze ABD- denies N/V, change in stools, abd pain GU- denies dysuria, hematuria, dribbling, incontinence MSK- denies joint pain, muscle aches, injury Neuro- denies headache, dizziness, syncope, seizure activity       Objective:    BP 132/80   Pulse 86   Temp 97.9 F (36.6 C) (Oral)   Resp 14   Ht 5\' 6"  (1.676 m)   Wt 186 lb (84.4 kg)   SpO2 97%   BMI 30.02 kg/m  GEN- NAD, alert and oriented x3 HEENT- PERRL, EOMI, non injected sclera, pink conjunctiva, MMM, oropharynx clear CVS- RRR, 2/6 SEM RESP-CTAB ABD-NABS,soft,NT,ND EXT- No edema Pulses- Radial  2+        Assessment & Plan:      Problem List Items Addressed This Visit      Unprioritized   Hyperlipidemia - Primary   Relevant Orders   Lipid panel   CKD (chronic kidney disease) stage 3, GFR 30-59 ml/min (HCC)   Relevant Orders   Lipid panel   Essential hypertension, benign    She is well controlled on the lower dose of the losartan.  We will check his fasting labs.  We will contact his granddaughter who dispenses his medications if any changes need to be  made. Flu shot given      Relevant Orders   CBC with Differential/Platelet   Comprehensive metabolic panel    Other Visit Diagnoses    Need for prophylactic vaccination and inoculation against influenza       Relevant Orders   Flu vaccine HIGH DOSE PF (Fluzone High dose) (Completed)      Note: This dictation was prepared with Dragon dictation along with smaller phrase technology. Any transcriptional errors that result from this process are unintentional.

## 2017-01-15 NOTE — Patient Instructions (Addendum)
F/U May  Flu shot given

## 2017-02-07 ENCOUNTER — Encounter: Payer: Self-pay | Admitting: Gastroenterology

## 2017-03-08 ENCOUNTER — Ambulatory Visit: Payer: Medicare HMO

## 2017-04-18 ENCOUNTER — Other Ambulatory Visit: Payer: Self-pay | Admitting: Family Medicine

## 2017-05-18 ENCOUNTER — Other Ambulatory Visit: Payer: Self-pay | Admitting: Family Medicine

## 2017-08-17 ENCOUNTER — Other Ambulatory Visit: Payer: Self-pay | Admitting: Family Medicine

## 2017-10-17 ENCOUNTER — Other Ambulatory Visit: Payer: Self-pay

## 2017-10-17 ENCOUNTER — Emergency Department (HOSPITAL_COMMUNITY): Payer: Medicare HMO

## 2017-10-17 ENCOUNTER — Emergency Department (HOSPITAL_COMMUNITY)
Admission: EM | Admit: 2017-10-17 | Discharge: 2017-10-17 | Disposition: A | Discharge status: Home / Self Care | Payer: Medicare HMO | Attending: Emergency Medicine | Admitting: Emergency Medicine

## 2017-10-17 ENCOUNTER — Encounter (HOSPITAL_COMMUNITY): Payer: Self-pay | Admitting: Emergency Medicine

## 2017-10-17 DIAGNOSIS — K5900 Constipation, unspecified: Secondary | ICD-10-CM

## 2017-10-17 DIAGNOSIS — N183 Chronic kidney disease, stage 3 (moderate): Secondary | ICD-10-CM | POA: Diagnosis not present

## 2017-10-17 DIAGNOSIS — I129 Hypertensive chronic kidney disease with stage 1 through stage 4 chronic kidney disease, or unspecified chronic kidney disease: Secondary | ICD-10-CM | POA: Diagnosis not present

## 2017-10-17 DIAGNOSIS — Z87891 Personal history of nicotine dependence: Secondary | ICD-10-CM | POA: Insufficient documentation

## 2017-10-17 MED ORDER — POLYETHYLENE GLYCOL 3350 17 G PO PACK: 17.0000 g | PACK | Freq: Every day | ORAL | 0 refills | Status: AC

## 2017-10-17 MED ORDER — SENNOSIDES-DOCUSATE SODIUM 8.6-50 MG PO TABS: 1.0000 | ORAL_TABLET | Freq: Every evening | ORAL | 0 refills | Status: AC | PRN

## 2017-10-17 NOTE — ED Triage Notes (Signed)
Patient complaining of no bowel movement x 2 days. Denies pain. States he took laxative early this morning with no relief.

## 2017-10-17 NOTE — ED Provider Notes (Signed)
Emergency Department Provider Note   I have reviewed the triage vital signs and the nursing notes.   HISTORY  Chief Complaint Constipation   HPI Scott Burton is a 82 y.o. male with PMH of CKD, GERD, HLD, and HTN department for evaluation of constipation for the past 2 days.  The patient typically has bowel movements daily.  He took a laxative earlier this morning and did have a small bowel movement but nothing regular.  Denies any blood in the stool.  He denies any significant abdominal pain or rectal discomfort.  No vomiting.  He is eating normally.  He does not take pain medications.  Denies fevers or chills.  Past Medical History:  Diagnosis Date  . Arthritis   . CKD (chronic kidney disease), stage III ([place])   . GERD (gastroesophageal reflux disease)   . Gout   . Hyperlipidemia   . Hypertension   . RBBB     Patient Active Problem List   Diagnosis Date Noted  . Special screening for malignant neoplasms, colon   . Gout 08/10/2015  . CKD (chronic kidney disease) stage 3, GFR 30-59 ml/min ([facility]) 04/26/2015  . Loss of weight 04/26/2015  . Hyperlipidemia 12/25/2014  . Essential hypertension, benign 09/25/2014  . Insomnia 09/25/2014    Past Surgical History:  Procedure Laterality Date  . COLONOSCOPY N/A 04/19/2016   Procedure: COLONOSCOPY;  Surgeon: Danie Binder, MD;  Location: [facility];  Service: Endoscopy;  Laterality: N/A;  1130   . NO PAST SURGERIES      Allergies Patient has no known allergies.  Family History  Problem Relation Age of Onset  . Cancer Father   . Dementia Mother     Social History Social History   Tobacco Use  . Smoking status: Former Smoker    Last attempt to quit: 01/17/1995    Years since quitting: 22.7  . Smokeless tobacco: Former Systems developer    Quit date: 01/28/1991  Substance Use Topics  . Alcohol use: No  . Drug use: No    Review of Systems  Constitutional: No fever/chills Eyes: No visual changes. ENT: No sore  throat. Cardiovascular: Denies chest pain. Respiratory: Denies shortness of breath. Gastrointestinal: No abdominal pain.  No nausea, no vomiting.  No diarrhea. Positive constipation.  Genitourinary: Negative for dysuria. Musculoskeletal: Negative for back pain. Skin: Negative for rash. Neurological: Negative for headaches, focal weakness or numbness.  10-point ROS otherwise negative.  ____________________________________________   PHYSICAL EXAM:  VITAL SIGNS: ED Triage Vitals [10/17/17 1104]  Enc Vitals Group     BP 134/76     Pulse Rate 89     Resp 20     Temp 98 F (36.7 C)     Temp Source Oral     SpO2 100 %     Weight 189 lb (85.7 kg)     Height 5\' 6"  (1.676 m)     Pain Score 0   Constitutional: Alert and oriented. Well appearing and in no acute distress. Eyes: Conjunctivae are normal.  Head: Atraumatic. Nose: No congestion/rhinnorhea. Mouth/Throat: Mucous membranes are moist.  Neck: No stridor. Cardiovascular: Normal rate, regular rhythm. Good peripheral circulation. Grossly normal heart sounds.   Respiratory: Normal respiratory effort.  No retractions. Lungs CTAB. Gastrointestinal: Soft and nontender. Mild distention.  Musculoskeletal: No lower extremity tenderness nor edema. No gross deformities of extremities. Neurologic:  Normal speech and language. No gross focal neurologic deficits are appreciated.  Skin:  Skin is warm, dry and  intact. No rash noted.  ____________________________________________  [id number]  Dg Abdomen Acute W/chest  Result Date: 10/17/2017 CLINICAL DATA:  Constipation. EXAM: DG ABDOMEN ACUTE W/ 1V CHEST COMPARISON:  Radiographs of May 04, 2008. FINDINGS: There is no evidence of dilated bowel loops or free intraperitoneal air. Mild amount of stool seen throughout the colon and rectum. No radiopaque calculi or other significant radiographic abnormality is seen. Heart size and mediastinal contours are within normal limits. Both lungs are  clear. IMPRESSION: No evidence of bowel obstruction or ileus. Mild stool burden is noted. No acute cardiopulmonary disease. Electronically Signed   By: Marijo Conception, M.D.   On: 10/17/2017 12:43    ____________________________________________   PROCEDURES  Procedure(s) performed:   Procedures  None ____________________________________________   INITIAL IMPRESSION / ASSESSMENT AND PLAN / ED COURSE  Pertinent labs & imaging results that were available during my care of the patient were reviewed by me and considered in my medical decision making (see chart for details).  Urgency department for evaluation of constipation over the past 2 days.  He has a mildly distended abdomen that is nontender to palpation.  No concern clinically for fecal impaction without rectal pain.  Lower suspicion for small bowel obstruction but given the patient's age plan for an acute abdomen x-ray for further evaluation.   1:05 PM Patient had a large BM in the emergency department without intervention.  He states he is feeling better.  I did call and laxative medications to his local pharmacy.  Advised being hydrated and follow-up with the primary care physician.   At this time, I do not feel there is any life-threatening condition present. I have reviewed and discussed all results (EKG, imaging, lab, urine as appropriate), exam findings with patient. I have reviewed nursing notes and appropriate previous records.  I feel the patient is safe to be discharged home without further emergent workup. Discussed usual and customary return precautions. Patient and family (if present) verbalize understanding and are comfortable with this plan.  Patient will follow-up with their primary care provider. If they do not have a primary care provider, information for follow-up has been provided to them. All questions have been answered.  ____________________________________________  FINAL CLINICAL IMPRESSION(S) / ED  DIAGNOSES  Final diagnoses:  Constipation, unspecified constipation type    NEW OUTPATIENT MEDICATIONS STARTED DURING THIS VISIT:  New Prescriptions   POLYETHYLENE GLYCOL (MIRALAX) PACKET    Take 17 g by mouth daily for 7 days.   SENNA-DOCUSATE (SENOKOT-S) 8.6-50 MG TABLET    Take 1 tablet by mouth at bedtime as needed for up to 7 days for mild constipation or moderate constipation.    Note:  This document was prepared using Dragon voice recognition software and may include unintentional dictation errors.  Nanda Quinton, MD Emergency Medicine    Rayland Hamed, [facility], MD 10/17/17 [phone]

## 2017-10-17 NOTE — ED Notes (Signed)
Pt returned from [facility].

## 2017-10-17 NOTE — Discharge Instructions (Signed)
You were seen in the emergency department today for constipation.  We recommend that you use one or more of the following over-the-counter medications in the order described: °  °1)  Colace (or Dulcolax) 100 mg:  This is a stool softener, and you may take it once or twice a day as needed. °2)  Senna tablets:  This is a bowel stimulant that will help "push" out your stool. It is the next step to add after you have tried a stool softener. °3)  Miralax (powder):  This medication works by drawing additional fluid into your intestines and helps to flush out your stool.  Mix the powder with water or juice according to label instructions.  It may help if the Colace and Senna are not sufficient, but you must be sure to use the recommended amount of water or juice when you mix up the powder. °Remember that narcotic pain medications are constipating, so avoid them or minimize their use.  Drink plenty of fluids. ° °Please return to the Emergency Department immediately if you develop new or worsening symptoms that concern you, such as (but not limited to) fever > 101 degrees, severe abdominal pain, or persistent vomiting. ° °

## 2017-11-08 ENCOUNTER — Other Ambulatory Visit: Payer: Self-pay | Admitting: Family Medicine

## 2017-12-04 ENCOUNTER — Other Ambulatory Visit: Payer: Self-pay

## 2017-12-04 ENCOUNTER — Encounter: Payer: Self-pay | Admitting: Family Medicine

## 2017-12-04 ENCOUNTER — Ambulatory Visit (INDEPENDENT_AMBULATORY_CARE_PROVIDER_SITE_OTHER): Payer: Medicare HMO | Admitting: Family Medicine

## 2017-12-04 VITALS — BP 142/82 | HR 86 | Temp 98.3°F | Resp 14 | Ht 66.0 in | Wt 190.0 lb

## 2017-12-04 DIAGNOSIS — I10 Essential (primary) hypertension: Secondary | ICD-10-CM | POA: Diagnosis not present

## 2017-12-04 DIAGNOSIS — N183 Chronic kidney disease, stage 3 unspecified: Secondary | ICD-10-CM

## 2017-12-04 DIAGNOSIS — E782 Mixed hyperlipidemia: Secondary | ICD-10-CM

## 2017-12-04 DIAGNOSIS — Z23 Encounter for immunization: Secondary | ICD-10-CM | POA: Diagnosis not present

## 2017-12-04 DIAGNOSIS — R42 Dizziness and giddiness: Secondary | ICD-10-CM

## 2017-12-04 NOTE — Patient Instructions (Addendum)
[facility] on [address] Losartan take 1/2 tablet of the 50mg  ( Circle white pill with the S)  We will call with lab results Look and see what medications he has at home  Flu shot done  F/U 3 weeks

## 2017-12-04 NOTE — Progress Notes (Signed)
Orthostatic Blood Pressure:   BP  HR SpO2 Lying:   142/ 88 98 96  Sitting:  152/90  92 96  Standing:  158/90  90 98  Standing x2: 144/88  90 96

## 2017-12-04 NOTE — Assessment & Plan Note (Signed)
No sign of orthostasis but his blood pressure is elevated.  I am to have him restart losartan at 25 mg once a day.  We will recheck his renal function as well as his liver function and nonfasting lipid panel.  He has daughters come to check on the other medications at home to see if he has indeed been taking the allopurinol or the Myrbetriq.  When I have him follow-up in 3 weeks to recheck his blood pressure.  His appetite is been good he is not have any weight loss.  There is no sign of any infection today.  I think that the dizzy spells may be some of his blood pressure changes in the pressure when he gets up too quickly advised to take his time when getting up.

## 2017-12-04 NOTE — Progress Notes (Signed)
Subjective:    Patient ID: Scott Burton, male    DOB: 1935/02/23, 82 y.o.   MRN: [id number]  Patient presents for Dizziness (intermittent dizziness when he tries to stand up too fast) 2 years secondary to intermittent dizzy spells.  He states that he gets a little dizzy when he stands up fast only lasts for a few seconds and goes away.  He has not had any falls is not had any loss of consciousness.  No significant shortness of breath or chest pain.  He does state that he may get a little winded if he goes upstairs.  He is eating regularly though mostly soft foods because his dentures do not fit.  He has not had any weight loss over the past year his last visit was in December 2018.  He is not had any hospitalizations.  He was on losartan for blood pressure as well as atorvastatin and allopurinol for gout and Myrbetriq.  He brought in a pill pack that losartan and atorvastatin in it.  He states that he has not taken anything recently he took 1 gout pill about a month ago.  He does not check his blood pressure at home.   Due for flu shot  Here today with his daughter- Jozef Eisenbeis     Review Of Systems:  GEN- denies fatigue, fever, weight loss,weakness, recent illness HEENT- denies eye drainage, change in vision, nasal discharge, CVS- denies chest pain, palpitations RESP- denies SOB, cough, wheeze ABD- denies N/V, change in stools, abd pain GU- denies dysuria, hematuria, dribbling, incontinence MSK- denies joint pain, muscle aches, injury Neuro- denies headache, +dizziness, syncope, seizure activity       Objective:    BP (!) 142/82   Pulse 86   Temp 98.3 F (36.8 C) (Oral)   Resp 14   Ht 5\' 6"  (1.676 m)   Wt 190 lb (86.2 kg)   SpO2 96%   BMI 30.67 kg/m  GEN- NAD, alert and oriented x3 HEENT- PERRL, EOMI, non injected sclera, pink conjunctiva, MMM, oropharynx clear Neck- Supple, no thyromegaly, no bruit CVS- RRR, no murmur RESP-CTAB ABD-NABS,soft,NT,ND Neuro- CNII-XII  grossly in tact, normal finger to nose, neg rhomberg, moving ext equally, walks unassisted , normal speech  EXT- No edema Pulses- Radial  2+        Assessment & Plan:      Problem List Items Addressed This Visit      Unprioritized   CKD (chronic kidney disease) stage 3, GFR 30-59 ml/min ([facility])   Relevant Orders   Comprehensive metabolic panel   Essential hypertension, benign - Primary    No sign of orthostasis but his blood pressure is elevated.  I am to have him restart losartan at 25 mg once a day.  We will recheck his renal function as well as his liver function and nonfasting lipid panel.  He has daughters come to check on the other medications at home to see if he has indeed been taking the allopurinol or the Myrbetriq.  When I have him follow-up in 3 weeks to recheck his blood pressure.  His appetite is been good he is not have any weight loss.  There is no sign of any infection today.  I think that the dizzy spells may be some of his blood pressure changes in the pressure when he gets up too quickly advised to take his time when getting up.      Relevant Orders   CBC with Differential/Platelet  Comprehensive metabolic panel   Lipid panel   TSH   Hyperlipidemia    Other Visit Diagnoses    Dizzy spells       Relevant Orders   CBC with Differential/Platelet   Comprehensive metabolic panel   TSH      Note: This dictation was prepared with Dragon dictation along with smaller phrase technology. Any transcriptional errors that result from this process are unintentional.

## 2017-12-05 LAB — COMPREHENSIVE METABOLIC PANEL
AG Ratio: 1.7 (calc) (ref 1.0–2.5)
ALBUMIN MSPROF: 4 g/dL (ref 3.6–5.1)
ALT: 9 U/L (ref 9–46)
AST: 14 U/L (ref 10–35)
Alkaline phosphatase (APISO): 66 U/L (ref 40–115)
BUN / CREAT RATIO: 6 (calc) (ref 6–22)
BUN: 9 mg/dL (ref 7–25)
CHLORIDE: 107 mmol/L (ref 98–110)
CO2: 23 mmol/L (ref 20–32)
Calcium: 9.6 mg/dL (ref 8.6–10.3)
Creat: 1.53 mg/dL — ABNORMAL HIGH (ref 0.70–1.11)
Globulin: 2.4 g/dL (calc) (ref 1.9–3.7)
Glucose, Bld: 98 mg/dL (ref 65–99)
Potassium: 3.9 mmol/L (ref 3.5–5.3)
Sodium: 139 mmol/L (ref 135–146)
Total Bilirubin: 0.4 mg/dL (ref 0.2–1.2)
Total Protein: 6.4 g/dL (ref 6.1–8.1)

## 2017-12-05 LAB — CBC WITH DIFFERENTIAL/PLATELET
BASOS PCT: 0.8 %
Basophils Absolute: 52 cells/uL (ref 0–200)
EOS ABS: 202 {cells}/uL (ref 15–500)
Eosinophils Relative: 3.1 %
HCT: 40.5 % (ref 38.5–50.0)
Hemoglobin: 13.8 g/dL (ref 13.2–17.1)
Lymphs Abs: 2503 cells/uL (ref 850–3900)
MCH: 29 pg (ref 27.0–33.0)
MCHC: 34.1 g/dL (ref 32.0–36.0)
MCV: 85.1 fL (ref 80.0–100.0)
MONOS PCT: 6.3 %
MPV: 11.8 fL (ref 7.5–12.5)
NEUTROS PCT: 51.3 %
Neutro Abs: 3335 cells/uL (ref 1500–7800)
PLATELETS: 218 10*3/uL (ref 140–400)
RBC: 4.76 10*6/uL (ref 4.20–5.80)
RDW: 12.6 % (ref 11.0–15.0)
TOTAL LYMPHOCYTE: 38.5 %
WBC mixed population: 410 cells/uL (ref 200–950)
WBC: 6.5 10*3/uL (ref 3.8–10.8)

## 2017-12-05 LAB — LIPID PANEL
CHOL/HDL RATIO: 5 (calc) — AB (ref <5.0)
Cholesterol: 164 mg/dL (ref <200)
HDL: 33 mg/dL — AB (ref >40)
LDL CHOLESTEROL (CALC): 96 mg/dL
Non-HDL Cholesterol (Calc): 131 mg/dL (calc) — ABNORMAL HIGH (ref <130)
Triglycerides: 234 mg/dL — ABNORMAL HIGH (ref <150)

## 2017-12-05 LAB — TSH: TSH: 1.89 mIU/L (ref 0.40–4.50)

## 2017-12-07 ENCOUNTER — Other Ambulatory Visit: Payer: Self-pay | Admitting: *Deleted

## 2018-02-08 ENCOUNTER — Other Ambulatory Visit: Payer: Self-pay | Admitting: Family Medicine

## 2018-04-30 ENCOUNTER — Emergency Department (HOSPITAL_COMMUNITY): Payer: Medicare HMO

## 2018-04-30 ENCOUNTER — Emergency Department (HOSPITAL_COMMUNITY)
Admission: EM | Admit: 2018-04-30 | Discharge: 2018-04-30 | Disposition: A | Discharge status: Home / Self Care | Payer: Medicare HMO | Attending: Emergency Medicine | Admitting: Emergency Medicine

## 2018-04-30 ENCOUNTER — Other Ambulatory Visit: Payer: Self-pay

## 2018-04-30 ENCOUNTER — Encounter (HOSPITAL_COMMUNITY): Payer: Self-pay | Admitting: Emergency Medicine

## 2018-04-30 DIAGNOSIS — Z041 Encounter for examination and observation following transport accident: Secondary | ICD-10-CM | POA: Diagnosis not present

## 2018-04-30 DIAGNOSIS — Z79899 Other long term (current) drug therapy: Secondary | ICD-10-CM | POA: Insufficient documentation

## 2018-04-30 DIAGNOSIS — N183 Chronic kidney disease, stage 3 (moderate): Secondary | ICD-10-CM | POA: Diagnosis not present

## 2018-04-30 DIAGNOSIS — Z87891 Personal history of nicotine dependence: Secondary | ICD-10-CM | POA: Insufficient documentation

## 2018-04-30 DIAGNOSIS — I129 Hypertensive chronic kidney disease with stage 1 through stage 4 chronic kidney disease, or unspecified chronic kidney disease: Secondary | ICD-10-CM | POA: Diagnosis not present
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
## 2018-04-30 NOTE — Discharge Instructions (Signed)
Take over the counter tylenol, as directed on packaging, as needed for discomfort.  Apply moist heat or ice to the area(s) of discomfort, for 15 minutes at a time, several times per day for the next few days.  Do not fall asleep on a heating or ice pack.  Call your regular medical doctor tomorrow to schedule a follow up appointment this week.  Return to the Emergency Department immediately if worsening. ° °
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
## 2018-05-06 ENCOUNTER — Other Ambulatory Visit: Payer: Self-pay | Admitting: Family Medicine

## 2018-06-11 ENCOUNTER — Other Ambulatory Visit: Payer: Self-pay

## 2018-06-11 ENCOUNTER — Ambulatory Visit (INDEPENDENT_AMBULATORY_CARE_PROVIDER_SITE_OTHER): Payer: Medicare HMO | Admitting: Family Medicine

## 2018-06-11 ENCOUNTER — Encounter: Payer: Self-pay | Admitting: Family Medicine

## 2018-06-11 VITALS — BP 130/80 | HR 92 | Temp 98.2°F | Resp 18 | Ht 66.0 in | Wt 184.4 lb

## 2018-06-11 DIAGNOSIS — K5900 Constipation, unspecified: Secondary | ICD-10-CM | POA: Diagnosis not present

## 2018-06-11 DIAGNOSIS — E782 Mixed hyperlipidemia: Secondary | ICD-10-CM | POA: Diagnosis not present

## 2018-06-11 DIAGNOSIS — I10 Essential (primary) hypertension: Secondary | ICD-10-CM

## 2018-06-11 DIAGNOSIS — M542 Cervicalgia: Secondary | ICD-10-CM | POA: Diagnosis not present

## 2018-06-11 DIAGNOSIS — N183 Chronic kidney disease, stage 3 unspecified: Secondary | ICD-10-CM

## 2018-06-11 DIAGNOSIS — M5136 Other intervertebral disc degeneration, lumbar region: Secondary | ICD-10-CM

## 2018-06-11 MED ORDER — POLYETHYLENE GLYCOL 3350 17 GM/SCOOP PO POWD: 17.0000 g | Freq: Every day | ORAL | 1 refills | Status: DC | PRN

## 2018-06-11 MED ORDER — TRAMADOL-ACETAMINOPHEN 37.5-325 MG PO TABS: 1.0000 | ORAL_TABLET | Freq: Four times a day (QID) | ORAL | 0 refills | Status: DC | PRN

## 2018-06-11 NOTE — Patient Instructions (Signed)
F/U 4 months for Physical Call if he is not improving over next 2 weeks Give miralax for constipation Take the ultracet for pain Use topical rub such as Bengay, aspercreme, biofreeze to apply to neck/shoulder area as needed
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
## 2018-06-11 NOTE — Assessment & Plan Note (Signed)
Continued on statin drug without any difficulty.  Check lipids and liver function

## 2018-06-11 NOTE — Assessment & Plan Note (Signed)
Blood pressure is controlled no change in medication. 

## 2018-06-11 NOTE — Assessment & Plan Note (Signed)
Avoiding NSAIDs 

## 2018-06-12 LAB — COMPREHENSIVE METABOLIC PANEL
AG Ratio: 1.3 (calc) (ref 1.0–2.5)
ALT: 13 U/L (ref 9–46)
AST: 14 U/L (ref 10–35)
Albumin: 4 g/dL (ref 3.6–5.1)
Alkaline phosphatase (APISO): 63 U/L (ref 35–144)
BUN/Creatinine Ratio: 11 (calc) (ref 6–22)
BUN: 16 mg/dL (ref 7–25)
CO2: 24 mmol/L (ref 20–32)
Calcium: 10.6 mg/dL — ABNORMAL HIGH (ref 8.6–10.3)
Chloride: 105 mmol/L (ref 98–110)
Creat: 1.45 mg/dL — ABNORMAL HIGH (ref 0.70–1.11)
Globulin: 3 g/dL (calc) (ref 1.9–3.7)
Glucose, Bld: 99 mg/dL (ref 65–99)
Potassium: 4.2 mmol/L (ref 3.5–5.3)
Sodium: 138 mmol/L (ref 135–146)
Total Bilirubin: 0.5 mg/dL (ref 0.2–1.2)
Total Protein: 7 g/dL (ref 6.1–8.1)

## 2018-06-12 LAB — CBC WITH DIFFERENTIAL/PLATELET
Absolute Monocytes: 340 cells/uL (ref 200–950)
Basophils Absolute: 63 cells/uL (ref 0–200)
Basophils Relative: 1 %
Eosinophils Absolute: 202 cells/uL (ref 15–500)
Eosinophils Relative: 3.2 %
HCT: 42.6 % (ref 38.5–50.0)
Hemoglobin: 13.9 g/dL (ref 13.2–17.1)
Lymphs Abs: 2476 cells/uL (ref 850–3900)
MCH: 28.7 pg (ref 27.0–33.0)
MCHC: 32.6 g/dL (ref 32.0–36.0)
MCV: 88 fL (ref 80.0–100.0)
MPV: 11.2 fL (ref 7.5–12.5)
Monocytes Relative: 5.4 %
Neutro Abs: 3219 cells/uL (ref 1500–7800)
Neutrophils Relative %: 51.1 %
Platelets: 231 10*3/uL (ref 140–400)
RBC: 4.84 10*6/uL (ref 4.20–5.80)
RDW: 12.7 % (ref 11.0–15.0)
Total Lymphocyte: 39.3 %
WBC: 6.3 10*3/uL (ref 3.8–10.8)

## 2018-06-12 LAB — LIPID PANEL
Cholesterol: 184 mg/dL (ref <200)
HDL: 39 mg/dL — ABNORMAL LOW (ref >=40)
LDL Cholesterol (Calc): 113 mg/dL (calc) — ABNORMAL HIGH
Non-HDL Cholesterol (Calc): 145 mg/dL (calc) — ABNORMAL HIGH (ref <130)
Total CHOL/HDL Ratio: 4.7 (calc) (ref <5.0)
Triglycerides: 205 mg/dL — ABNORMAL HIGH (ref <150)

## 2018-06-18 ENCOUNTER — Other Ambulatory Visit: Payer: Self-pay | Admitting: Family Medicine

## 2018-06-18 ENCOUNTER — Encounter: Payer: Self-pay | Admitting: Family Medicine

## 2018-06-18 MED ORDER — ATORVASTATIN CALCIUM 20 MG PO TABS: 20.0000 mg | ORAL_TABLET | Freq: Every day | ORAL | 1 refills | Status: DC

## 2018-07-24 ENCOUNTER — Other Ambulatory Visit: Payer: Self-pay | Admitting: Family Medicine

## 2018-07-24 NOTE — Telephone Encounter (Signed)
Ok to refill??  Last office visit/ refill 06/11/2018.

## 2018-07-24 NOTE — Telephone Encounter (Signed)
No cannot refill narcotic pain meds for acute pain per [place] STOP laws.  Pt will need to be seen for f/up.

## 2018-10-22 ENCOUNTER — Other Ambulatory Visit: Payer: Self-pay | Admitting: Family Medicine

## 2018-11-04 ENCOUNTER — Other Ambulatory Visit: Payer: Self-pay

## 2018-11-05 ENCOUNTER — Ambulatory Visit (INDEPENDENT_AMBULATORY_CARE_PROVIDER_SITE_OTHER): Payer: Medicare HMO | Admitting: Family Medicine

## 2018-11-05 ENCOUNTER — Encounter: Payer: Self-pay | Admitting: Family Medicine

## 2018-11-05 VITALS — BP 132/76 | HR 86 | Temp 98.2°F | Resp 14 | Ht 66.0 in | Wt 190.0 lb

## 2018-11-05 DIAGNOSIS — F329 Major depressive disorder, single episode, unspecified: Secondary | ICD-10-CM | POA: Diagnosis not present

## 2018-11-05 DIAGNOSIS — I10 Essential (primary) hypertension: Secondary | ICD-10-CM

## 2018-11-05 DIAGNOSIS — F32A Depression, unspecified: Secondary | ICD-10-CM

## 2018-11-05 DIAGNOSIS — M10071 Idiopathic gout, right ankle and foot: Secondary | ICD-10-CM

## 2018-11-05 DIAGNOSIS — K5900 Constipation, unspecified: Secondary | ICD-10-CM | POA: Insufficient documentation

## 2018-11-05 DIAGNOSIS — N1831 Chronic kidney disease, stage 3a: Secondary | ICD-10-CM | POA: Diagnosis not present

## 2018-11-05 DIAGNOSIS — K5901 Slow transit constipation: Secondary | ICD-10-CM

## 2018-11-05 DIAGNOSIS — R4189 Other symptoms and signs involving cognitive functions and awareness: Secondary | ICD-10-CM | POA: Insufficient documentation

## 2018-11-05 DIAGNOSIS — F419 Anxiety disorder, unspecified: Secondary | ICD-10-CM | POA: Insufficient documentation

## 2018-11-05 DIAGNOSIS — R6889 Other general symptoms and signs: Secondary | ICD-10-CM | POA: Diagnosis not present

## 2018-11-05 NOTE — Assessment & Plan Note (Signed)
Controlled no changes to losartan, check renal function

## 2018-11-05 NOTE — Assessment & Plan Note (Signed)
Restart miralax 

## 2018-11-05 NOTE — Patient Instructions (Addendum)
We will call with results He does have some memory changes- most is age related  Based on his labs will decide on medication at bedtime to help with sleep/mood  Restart miralax for his constipation- he can take 1 cap full once a day in coffee or water  F/U 4 months

## 2018-11-05 NOTE — Assessment & Plan Note (Addendum)
Depression symptoms and some sleep issues If labs normal , would recommend trazodone 25mg  at bedtime He does have some cognitive changes but also in realm of normal for his age,  He Scott Burton has some microvascular changes if imaging were to be done

## 2018-11-05 NOTE — Progress Notes (Signed)
   Subjective:    Patient ID: Scott Burton, male    DOB: [birth year], 83 y.o.   MRN: [id number]  Patient presents for Memory Issues (family concerned about dementia/ depression)  Patient here with his grandson.  We had a note from his grand daughter stating that his memory has been declining that he seemed depressed the past few months.  He admits that he has been thinking about his daughter who passed away and his wife.  He finds himself sad a lot.  His sleep is okay he does go to bed early but often is up in the middle the night.  He denies any chest pain shortness of breath.  His finances are already carried out by his son his medications by his granddaughter.  He states he is only been taking 2 pills he did not bring them with him today he thinks 1 is for the blood pressure.  When asked about his other meds include his constipation medicine states that he still constipated but he has not been drinking the pallor.   Review Of Systems:  GEN- denies fatigue, fever, weight loss,weakness, recent illness HEENT- denies eye drainage, change in vision, nasal discharge, CVS- denies chest pain, palpitations RESP- denies SOB, cough, wheeze ABD- denies N/V, change in stools, abd pain GU- denies dysuria, hematuria, dribbling, incontinence MSK- denies joint pain, muscle aches, injury Neuro- denies headache, dizziness, syncope, seizure activity       Objective:    BP 132/76   Pulse 86   Temp 98.2 F (36.8 C) (Oral)   Resp 14   Ht 5\' 6"  (1.676 m)   Wt 190 lb (86.2 kg)   SpO2 98%   BMI 30.67 kg/m  GEN- NAD, alert and oriented x3 HEENT- PERRL, EOMI, non injected sclera, pink conjunctiva, MMM, oropharynx clear Neck- Supple, no thyromegaly CVS- RRR, no murmur RESP-CTAB ABD-NABS,soft,NT,ND EXT- No edema Pulses- Radial, DP- 2+ Psych- Normal affect and mood, phq 9 score 5  MMSE- 24/30     Assessment & Plan:      Problem List Items Addressed This Visit      Unprioritized   CKD  (chronic kidney disease) stage 3, GFR 30-59 ml/min   Cognitive changes   Relevant Orders   Vitamin B12   Constipation    Restart miralax       Depression    Depression symptoms and some sleep issues If labs normal , would recommend trazodone 25mg  at bedtime He does have some cognitive changes but also in realm of normal for his age,  He likley has some microvascular changes if imaging were to be done       Essential hypertension, benign - Primary    Controlled no changes to losartan, check renal function      Relevant Orders   CBC with Differential/Platelet   Comprehensive metabolic panel   LDL Cholesterol, Direct   Gout    Recheck uric acid, he has not been taking the allopurinol      Relevant Orders   Uric Acid      Note: This dictation was prepared with Dragon dictation along with smaller phrase technology. Any transcriptional errors that result from this process are unintentional.

## 2018-11-05 NOTE — Assessment & Plan Note (Signed)
Recheck uric acid, he has not been taking the allopurinol

## 2018-11-06 LAB — VITAMIN B12: Vitamin B-12: 547 pg/mL (ref 200–1100)

## 2018-11-06 LAB — CBC WITH DIFFERENTIAL/PLATELET
Absolute Monocytes: 322 cells/uL (ref 200–950)
Basophils Absolute: 62 cells/uL (ref 0–200)
Basophils Relative: 1 %
Eosinophils Absolute: 118 cells/uL (ref 15–500)
Eosinophils Relative: 1.9 %
HCT: 44.8 % (ref 38.5–50.0)
Hemoglobin: 14.8 g/dL (ref 13.2–17.1)
Lymphs Abs: 2356 cells/uL (ref 850–3900)
MCH: 28.7 pg (ref 27.0–33.0)
MCHC: 33 g/dL (ref 32.0–36.0)
MCV: 87 fL (ref 80.0–100.0)
MPV: 12.3 fL (ref 7.5–12.5)
Monocytes Relative: 5.2 %
Neutro Abs: 3342 cells/uL (ref 1500–7800)
Neutrophils Relative %: 53.9 %
Platelets: 198 10*3/uL (ref 140–400)
RBC: 5.15 10*6/uL (ref 4.20–5.80)
RDW: 13 % (ref 11.0–15.0)
Total Lymphocyte: 38 %
WBC: 6.2 10*3/uL (ref 3.8–10.8)

## 2018-11-06 LAB — COMPREHENSIVE METABOLIC PANEL
AG Ratio: 1.3 (calc) (ref 1.0–2.5)
ALT: 10 U/L (ref 9–46)
AST: 18 U/L (ref 10–35)
Albumin: 3.9 g/dL (ref 3.6–5.1)
Alkaline phosphatase (APISO): 60 U/L (ref 35–144)
BUN/Creatinine Ratio: 11 (calc) (ref 6–22)
BUN: 16 mg/dL (ref 7–25)
CO2: 19 mmol/L — ABNORMAL LOW (ref 20–32)
Calcium: 10.9 mg/dL — ABNORMAL HIGH (ref 8.6–10.3)
Chloride: 107 mmol/L (ref 98–110)
Creat: 1.4 mg/dL — ABNORMAL HIGH (ref 0.70–1.11)
Globulin: 3 g/dL (calc) (ref 1.9–3.7)
Glucose, Bld: 98 mg/dL (ref 65–99)
Potassium: 4.2 mmol/L (ref 3.5–5.3)
Sodium: 141 mmol/L (ref 135–146)
Total Bilirubin: 0.4 mg/dL (ref 0.2–1.2)
Total Protein: 6.9 g/dL (ref 6.1–8.1)

## 2018-11-06 LAB — URIC ACID: Uric Acid, Serum: 7.8 mg/dL (ref 4.0–8.0)

## 2018-11-06 LAB — LDL CHOLESTEROL, DIRECT: Direct LDL: 130 mg/dL — ABNORMAL HIGH (ref <100)

## 2018-11-11 ENCOUNTER — Other Ambulatory Visit: Payer: Self-pay

## 2018-11-11 MED ORDER — TRAZODONE HCL 50 MG PO TABS: 25.0000 mg | ORAL_TABLET | Freq: Every day | ORAL | 6 refills | Status: DC

## 2018-11-11 MED ORDER — ATORVASTATIN CALCIUM 20 MG PO TABS: 20.0000 mg | ORAL_TABLET | Freq: Every day | ORAL | 1 refills | Status: DC

## 2018-11-11 NOTE — Telephone Encounter (Signed)
Requested Prescriptions   Pending Prescriptions Disp Refills  . traZODone (DESYREL) 50 MG tablet 15 tablet 0    Sig: Take 0.5 tablets (25 mg total) by mouth at bedtime. Take 1/2 tab

## 2018-11-11 NOTE — Addendum Note (Signed)
Addended by: Vic Blackbird F on: 11/11/2018 01:20 PM   Modules accepted: Orders

## 2018-11-11 NOTE — Telephone Encounter (Signed)
-----   Message from Vanice Sarah, [place] sent at 11/11/2018 11:35 AM EDT ----- [name] called back for lab results. Verbalizes understanding. Pt is currently out of his allopurinol and Sheena states she will have him stop his supplement with calcium. Rx sent to pharmacy and lab appt made to have additional labs drawn.

## 2018-11-13 ENCOUNTER — Other Ambulatory Visit: Payer: Self-pay

## 2018-11-13 ENCOUNTER — Other Ambulatory Visit: Payer: Medicare HMO

## 2018-11-14 LAB — PTH, INTACT AND CALCIUM
Calcium: 10.5 mg/dL — ABNORMAL HIGH (ref 8.6–10.3)
PTH: 90 pg/mL — ABNORMAL HIGH (ref 14–64)

## 2018-11-14 LAB — VITAMIN D 25 HYDROXY (VIT D DEFICIENCY, FRACTURES): Vit D, 25-Hydroxy: 20 ng/mL — ABNORMAL LOW (ref 30–100)

## 2018-11-15 ENCOUNTER — Encounter: Payer: Self-pay | Admitting: Family Medicine

## 2018-11-15 DIAGNOSIS — E559 Vitamin D deficiency, unspecified: Secondary | ICD-10-CM | POA: Insufficient documentation

## 2018-11-15 DIAGNOSIS — E212 Other hyperparathyroidism: Secondary | ICD-10-CM | POA: Insufficient documentation

## 2018-11-15 DIAGNOSIS — E210 Primary hyperparathyroidism: Secondary | ICD-10-CM | POA: Insufficient documentation

## 2019-01-22 ENCOUNTER — Telehealth: Payer: Self-pay | Admitting: Family Medicine

## 2019-01-22 NOTE — Telephone Encounter (Signed)
Called stating that Mr. Scott Burton, caller's grandfather, and he is feeling weak and has a cough, no other symptoms. I recommended that he increase his fluid intake and will schedule a phone visit to discuss his cough. Called son Scott Burton as per Granddaughter - no answer LMOVM stating that I was scheduling a phone visit and time and if that did not work for him to call us back to reschedule.

## 2019-01-23 ENCOUNTER — Other Ambulatory Visit: Payer: Self-pay

## 2019-01-23 ENCOUNTER — Ambulatory Visit (INDEPENDENT_AMBULATORY_CARE_PROVIDER_SITE_OTHER): Payer: Medicare HMO | Admitting: Family Medicine

## 2019-01-23 DIAGNOSIS — R05 Cough: Secondary | ICD-10-CM

## 2019-01-23 DIAGNOSIS — R059 Cough, unspecified: Secondary | ICD-10-CM
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
## 2019-01-24 ENCOUNTER — Other Ambulatory Visit: Payer: Self-pay

## 2019-01-24 ENCOUNTER — Ambulatory Visit: Payer: Medicare HMO | Attending: Internal Medicine

## 2019-01-24 DIAGNOSIS — Z20822 Contact with and (suspected) exposure to covid-19: Secondary | ICD-10-CM

## 2019-01-25 LAB — NOVEL CORONAVIRUS, NAA: SARS-CoV-2, NAA: DETECTED — AB

## 2019-01-26 ENCOUNTER — Telehealth: Payer: Self-pay | Admitting: Family Medicine

## 2019-01-26 DIAGNOSIS — R69 Illness, unspecified: Secondary | ICD-10-CM | POA: Diagnosis not present

## 2019-01-26 NOTE — Telephone Encounter (Signed)
Pt given Covid-19 positive results. Discussed mild, moderate and severe symptoms. Advised pt to call 911 for any respiratory issues and/dehydration. Discussed non test criteria for ending self isolation. Pt advised of way to manage symptoms at home and review isolation precautions especially the importance of washing hands frequently and wearing a mask when around others. Pt verbalized understanding.  Spoke with pt and daughter-in-law Joelene Millin. Pt is experiencing weakness and advised Joelene Millin to call 911. Joelene Millin verbalized understanding. Routing to Dr. [name].

## 2019-01-26 NOTE — Telephone Encounter (Signed)
Copied from [place] [id number]. Topic: General - Other >> Jan 26, 2019  2:55 PM Keene Breath wrote: Reason for CRM: Called to discuss his positive COVID results.  CB# [phone]
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
## 2019-01-27 NOTE — Telephone Encounter (Signed)
Noted  

## 2019-01-27 NOTE — Telephone Encounter (Signed)
Call pt family, see if he was admitted with COVID, or back at home Continue to monitor breathing if at home, keep hydrated

## 2019-01-31 DIAGNOSIS — U071 COVID-19: Secondary | ICD-10-CM | POA: Diagnosis not present

## 2019-03-10 ENCOUNTER — Ambulatory Visit: Payer: Medicare HMO | Admitting: Family Medicine

## 2019-03-18 ENCOUNTER — Encounter: Payer: Self-pay | Admitting: Family Medicine

## 2019-03-18 ENCOUNTER — Other Ambulatory Visit: Payer: Self-pay

## 2019-03-18 ENCOUNTER — Ambulatory Visit (INDEPENDENT_AMBULATORY_CARE_PROVIDER_SITE_OTHER): Payer: Medicare HMO | Admitting: Family Medicine

## 2019-03-18 VITALS — BP 134/62 | HR 72 | Temp 98.2°F | Resp 14 | Ht 66.0 in | Wt 180.0 lb

## 2019-03-18 DIAGNOSIS — E46 Unspecified protein-calorie malnutrition: Secondary | ICD-10-CM | POA: Insufficient documentation

## 2019-03-18 DIAGNOSIS — N1831 Chronic kidney disease, stage 3a: Secondary | ICD-10-CM | POA: Diagnosis not present

## 2019-03-18 DIAGNOSIS — R2681 Unsteadiness on feet: Secondary | ICD-10-CM

## 2019-03-18 DIAGNOSIS — M51369 Other intervertebral disc degeneration, lumbar region without mention of lumbar back pain or lower extremity pain: Secondary | ICD-10-CM

## 2019-03-18 DIAGNOSIS — F329 Major depressive disorder, single episode, unspecified: Secondary | ICD-10-CM

## 2019-03-18 DIAGNOSIS — R4189 Other symptoms and signs involving cognitive functions and awareness: Secondary | ICD-10-CM | POA: Diagnosis not present

## 2019-03-18 DIAGNOSIS — E440 Moderate protein-calorie malnutrition: Secondary | ICD-10-CM

## 2019-03-18 DIAGNOSIS — I10 Essential (primary) hypertension: Secondary | ICD-10-CM

## 2019-03-18 DIAGNOSIS — M5136 Other intervertebral disc degeneration, lumbar region: Secondary | ICD-10-CM

## 2019-03-18 DIAGNOSIS — F32A Depression, unspecified: Secondary | ICD-10-CM

## 2019-03-18 MED ORDER — ATORVASTATIN CALCIUM 20 MG PO TABS: 20.0000 mg | ORAL_TABLET | Freq: Every day | ORAL | 1 refills | Status: DC

## 2019-03-18 MED ORDER — TRAZODONE HCL 50 MG PO TABS: 25.0000 mg | ORAL_TABLET | Freq: Every day | ORAL | 6 refills | Status: DC

## 2019-03-18 MED ORDER — LOSARTAN POTASSIUM 50 MG PO TABS: 50.0000 mg | ORAL_TABLET | Freq: Every day | ORAL | 3 refills | Status: DC

## 2019-03-18 NOTE — Assessment & Plan Note (Signed)
Weight loss noted.  His appetite is now much improved and he is drinking Ensure daily.  We will see if he rebounds back to his baseline weight over the next month or so

## 2019-03-18 NOTE — Assessment & Plan Note (Signed)
Blood pressure controlled no change in medication.  Check labs.  I am concerned about his gait instability at home he has higher fall risk and also has some cognitive decline.  Family is trying to go through the house to remove any obstacles such as throw rugs.  He will benefit from shower chair and commode extender to assist him in the bathroom and be more independent

## 2019-03-18 NOTE — Patient Instructions (Signed)
F/U 4 months for Physical  

## 2019-03-18 NOTE — Progress Notes (Signed)
   Subjective:    Patient ID: Scott Burton, male    DOB: 03-Feb-1935, 84 y.o.   MRN: [id number]  Patient presents for Follow-up (is not fasting)   Pt here for F/U COVID infection in Jan.  He is here with his granddaughter who also helps care for him.   Weight down 10lbs since Oct his appetite went down significantly when he had COVID-19 infection.  This is now improved.  He is also drinking Ensure daily.    HTN-taking losartan without any difficulty.  For repeat renal function.  Hyperlipidemia- taking lipitor    Mood/sleep-he is taking trazodone which helps with the sleep and his mood.  Family has noticed a difference with this.   Staying with his Son and daughter law   He had 2 falls during COVID/ unclear cause.  Seems that he just stumbles at times.  He does need some assistance with commode extender and shower chair as he has some difficulty maintaining his balance while using the restroom.     Review Of Systems:  GEN- denies fatigue, fever, weight loss,weakness, recent illness HEENT- denies eye drainage, change in vision, nasal discharge, CVS- denies chest pain, palpitations RESP- denies SOB, cough, wheeze ABD- denies N/V, change in stools, abd pain GU- denies dysuria, hematuria, dribbling, incontinence MSK- denies joint pain, muscle aches, injury Neuro- denies headache, dizziness, syncope, seizure activity       Objective:    BP 134/62   Pulse 72   Temp 98.2 F (36.8 C) (Temporal)   Resp 14   Ht 5\' 6"  (1.676 m)   Wt 180 lb (81.6 kg)   SpO2 100%   BMI 29.05 kg/m  GEN- NAD, alert and oriented x3 HEENT- PERRL, EOMI, non injected sclera, pink conjunctiva, MMM, oropharynx clear Neck- Supple, no thyromegaly CVS- RRR, no murmur RESP-CTAB ABD-NABS,soft,NT,ND EXT- No edema Pulses- Radial, DP- 2+        Assessment & Plan:      Problem List Items Addressed This Visit      Unprioritized   CKD (chronic kidney disease) stage 3, GFR 30-59 ml/min   Cognitive  changes - Primary   DDD (degenerative disc disease), lumbar   Depression   Relevant Medications   traZODone (DESYREL) 50 MG tablet   Essential hypertension, benign    Blood pressure controlled no change in medication.  Check labs.  I am concerned about his gait instability at home he has higher fall risk and also has some cognitive decline.  Family is trying to go through the house to remove any obstacles such as throw rugs.  He will benefit from shower chair and commode extender to assist him in the bathroom and be more independent      Relevant Medications   atorvastatin (LIPITOR) 20 MG tablet   losartan (COZAAR) 50 MG tablet   Other Relevant Orders   CBC with Differential/Platelet   Comprehensive metabolic panel   Lipid panel   Gait instability   Protein-calorie malnutrition ([place])    Weight loss noted.  His appetite is now much improved and he is drinking Ensure daily.  We will see if he rebounds back to his baseline weight over the next month or so         Note: This dictation was prepared with Dragon dictation along with smaller phrase technology. Any transcriptional errors that result from this process are unintentional.

## 2019-03-19 LAB — COMPREHENSIVE METABOLIC PANEL
AG Ratio: 1.2 (calc) (ref 1.0–2.5)
ALT: 13 U/L (ref 9–46)
AST: 15 U/L (ref 10–35)
Albumin: 3.8 g/dL (ref 3.6–5.1)
Alkaline phosphatase (APISO): 62 U/L (ref 35–144)
BUN/Creatinine Ratio: 10 (calc) (ref 6–22)
BUN: 14 mg/dL (ref 7–25)
CO2: 21 mmol/L (ref 20–32)
Calcium: 10.4 mg/dL — ABNORMAL HIGH (ref 8.6–10.3)
Chloride: 106 mmol/L (ref 98–110)
Creat: 1.35 mg/dL — ABNORMAL HIGH (ref 0.70–1.11)
Globulin: 3.2 g/dL (calc) (ref 1.9–3.7)
Glucose, Bld: 106 mg/dL — ABNORMAL HIGH (ref 65–99)
Potassium: 4.1 mmol/L (ref 3.5–5.3)
Sodium: 139 mmol/L (ref 135–146)
Total Bilirubin: 0.6 mg/dL (ref 0.2–1.2)
Total Protein: 7 g/dL (ref 6.1–8.1)

## 2019-03-19 LAB — LIPID PANEL
Cholesterol: 173 mg/dL (ref <200)
HDL: 35 mg/dL — ABNORMAL LOW (ref >=40)
LDL Cholesterol (Calc): 103 mg/dL (calc) — ABNORMAL HIGH
Non-HDL Cholesterol (Calc): 138 mg/dL (calc) — ABNORMAL HIGH (ref <130)
Total CHOL/HDL Ratio: 4.9 (calc) (ref <5.0)
Triglycerides: 229 mg/dL — ABNORMAL HIGH (ref <150)

## 2019-03-19 LAB — CBC WITH DIFFERENTIAL/PLATELET
Absolute Monocytes: 384 cells/uL (ref 200–950)
Basophils Absolute: 51 cells/uL (ref 0–200)
Basophils Relative: 0.8 %
Eosinophils Absolute: 173 cells/uL (ref 15–500)
Eosinophils Relative: 2.7 %
HCT: 38.3 % — ABNORMAL LOW (ref 38.5–50.0)
Hemoglobin: 12.5 g/dL — ABNORMAL LOW (ref 13.2–17.1)
Lymphs Abs: 1914 cells/uL (ref 850–3900)
MCH: 28.3 pg (ref 27.0–33.0)
MCHC: 32.6 g/dL (ref 32.0–36.0)
MCV: 86.8 fL (ref 80.0–100.0)
MPV: 11.4 fL (ref 7.5–12.5)
Monocytes Relative: 6 %
Neutro Abs: 3878 cells/uL (ref 1500–7800)
Neutrophils Relative %: 60.6 %
Platelets: 247 10*3/uL (ref 140–400)
RBC: 4.41 10*6/uL (ref 4.20–5.80)
RDW: 13.5 % (ref 11.0–15.0)
Total Lymphocyte: 29.9 %
WBC: 6.4 10*3/uL (ref 3.8–10.8)

## 2019-07-04 ENCOUNTER — Other Ambulatory Visit: Payer: Self-pay | Admitting: Family Medicine

## 2019-07-23 ENCOUNTER — Other Ambulatory Visit: Payer: Self-pay

## 2019-07-23 ENCOUNTER — Ambulatory Visit (INDEPENDENT_AMBULATORY_CARE_PROVIDER_SITE_OTHER): Payer: Medicare HMO | Admitting: Family Medicine

## 2019-07-23 ENCOUNTER — Encounter: Payer: Self-pay | Admitting: Family Medicine

## 2019-07-23 VITALS — BP 120/68 | HR 82 | Temp 98.1°F | Resp 14 | Ht 66.0 in | Wt 185.0 lb

## 2019-07-23 DIAGNOSIS — E210 Primary hyperparathyroidism: Secondary | ICD-10-CM | POA: Diagnosis not present

## 2019-07-23 DIAGNOSIS — E559 Vitamin D deficiency, unspecified: Secondary | ICD-10-CM

## 2019-07-23 DIAGNOSIS — F329 Major depressive disorder, single episode, unspecified: Secondary | ICD-10-CM

## 2019-07-23 DIAGNOSIS — I10 Essential (primary) hypertension: Secondary | ICD-10-CM | POA: Diagnosis not present

## 2019-07-23 DIAGNOSIS — Z0001 Encounter for general adult medical examination with abnormal findings: Secondary | ICD-10-CM | POA: Diagnosis not present

## 2019-07-23 DIAGNOSIS — E440 Moderate protein-calorie malnutrition: Secondary | ICD-10-CM | POA: Diagnosis not present

## 2019-07-23 DIAGNOSIS — R4189 Other symptoms and signs involving cognitive functions and awareness: Secondary | ICD-10-CM

## 2019-07-23 DIAGNOSIS — E782 Mixed hyperlipidemia: Secondary | ICD-10-CM | POA: Diagnosis not present

## 2019-07-23 DIAGNOSIS — N1831 Chronic kidney disease, stage 3a: Secondary | ICD-10-CM

## 2019-07-23 DIAGNOSIS — Z0000 Encounter for general adult medical examination without abnormal findings: Secondary | ICD-10-CM

## 2019-07-23 DIAGNOSIS — F32A Depression, unspecified: Secondary | ICD-10-CM

## 2019-07-23 MED ORDER — POLYETHYLENE GLYCOL 3350 17 GM/SCOOP PO POWD: 17.0000 g | Freq: Every day | ORAL | 1 refills | Status: DC | PRN

## 2019-07-23 MED ORDER — LOSARTAN POTASSIUM 50 MG PO TABS: 50.0000 mg | ORAL_TABLET | Freq: Every day | ORAL | 3 refills | Status: DC

## 2019-07-23 MED ORDER — TRAZODONE HCL 50 MG PO TABS: 25.0000 mg | ORAL_TABLET | Freq: Every day | ORAL | 6 refills | Status: DC

## 2019-07-23 NOTE — Progress Notes (Signed)
Subjective:   Patient presents for Medicare Annual/Subsequent preventive examination.   Pt here for CPE  meds reviewed   Pt lives with son and daughter in law   Protein calorie malnutrition he is taking Ensure daily.  His weight at his visit in March was 180lbs , appetite is good, now  185lbs   Hypertension he is taking losartan as prescribed  Hyperlipidemia he is taking Lipitor 20 mg daily  Review Past Medical/Family/Social: Per EMR   Depression/mood changes.  25 mg of trazodone at bedtime, no SE ith meds   Pt has had covid-19 INFECTION   Risk Factors  Current exercise habits: walks Dietary issues discussed: yes  Cardiac risk factors: HTN , hYPERLIPIDEMIA   Depression Screen  (Note: if answer to either of the following is "Yes", a more complete depression screening is indicated)  Over the past two weeks, have you felt down, depressed or hopeless? No Over the past two weeks, have you felt little interest or pleasure in doing things? No Have you lost interest or pleasure in daily life? No Do you often feel hopeless? No Do you cry easily over simple problems? No   Activities of Daily Living  In your present state of health, do you have any difficulty performing the following activities?:  Driving? Yes  Managing money?  Yes Feeding yourself? No  Getting from bed to chair? No  Climbing a flight of stairs? No  Preparing food and eating?: Yes  Bathing or showering? No  Getting dressed: No  Getting to the toilet? No  Using the toilet:No  Moving around from place to place: No  In the past year have you fallen or had a near fall?:Yes  Are you sexually active? No  Do you have more than one partner? No   Hearing Difficulties: Yes - declines hearing aides  Do you often ask people to speak up or repeat themselves? Yes  Do you experience ringing or noises in your ears? No Do you have difficulty understanding soft or whispered voices? Yes  Do you feel that you have a problem  with memory? No Do you often misplace items? No  Do you feel safe at home? Yes  Cognitive Testing  Alert? Yes Normal Appearance?Yes  Oriented to person? Yes Place? Yes  Time? Yes  Recall of three objects?  no  Can perform simple calculations? Yes  Displays appropriate judgment?Yes  Can read the correct time from a watch face?Yes   List the Names of Other Physician/Practitioners you currently use:   None  Screening Tests / Date Colonoscopy   Over age                   Zostavax  utdPER PT previous notes  PNA- UTD per pt report Influenza Vaccine  UTD  Tetanus/tdap - defer at this time  COVID-19 vaccine utd [facility] [place]  ROS:  GEN- denies fatigue, fever, weight loss,weakness, recent illness HEENT- denies eye drainage, change in vision, nasal discharge, CVS- denies chest pain, palpitations RESP- denies SOB, cough, wheeze ABD- denies N/V, change in stools, abd pain GU- denies dysuria, hematuria, dribbling, incontinence MSK- denies joint pain, muscle aches, injury Neuro- denies headache, dizziness, syncope, seizure activity  Physical: vitals reviewed GEN- NAD, alert and oriented x3 HEENT- PERRL, EOMI, non injected sclera, pink conjunctiva, MMM, oropharynx clear Neck- Supple, no thryomegaly, no bruit  CVS- RRR, systolic  murmur RESP-CTAB ABD-NABS,soft,NT,ND PSYCH- Normal affect and mood , MMSE 27/ 30 EXT- No edema Pulses- Radial, DP-  2+   Assessment:    Annual wellness medicare exam   Plan:    During the course of the visit the patient was educated and counseled about appropriate screening and preventive services including:    HTn- controlled no changes   Hyperlipidemia- last TG elevated at  229, LDL  103 taking lipitor 20mg  daily   CKD- Baseline 1.3-1.4 , recheck renal function   Hyperparathyroidism - recheck PTH , vitamin D  Conginitive changes- age related, appetite improved, doing well ont Trazodone for depression which is working well - phq SCORE 6    Immunizations UTD  Protein malnutriition- improved weight, continue supplements  F/U 4 months    High fall risk- family helping with pt         Diet review for nutrition referral? Yes ____ Not Indicated __x__  Patient Instructions (the written plan) was given to the patient.  Medicare Attestation  I have personally reviewed:  The patient's medical and social history  Their use of alcohol, tobacco or illicit drugs  Their current medications and supplements  The patient's functional ability including ADLs,fall risks, home safety risks, cognitive, and hearing and visual impairment  Diet and physical activities  Evidence for depression or mood disorders  The patient's weight, height, BMI, and visual acuity have been recorded in the chart. I have made referrals, counseling, and provided education to the patient based on review of the above and I have provided the patient with a written personalized care plan for preventive services.

## 2019-07-23 NOTE — Patient Instructions (Signed)
F/U 4 months  We will call with lab results  

## 2019-07-24 LAB — CBC WITH DIFFERENTIAL/PLATELET
Absolute Monocytes: 228 cells/uL (ref 200–950)
Basophils Absolute: 68 cells/uL (ref 0–200)
Basophils Relative: 1.2 %
Eosinophils Absolute: 171 cells/uL (ref 15–500)
Eosinophils Relative: 3 %
HCT: 39 % (ref 38.5–50.0)
Hemoglobin: 12.8 g/dL — ABNORMAL LOW (ref 13.2–17.1)
Lymphs Abs: 2115 cells/uL (ref 850–3900)
MCH: 28.6 pg (ref 27.0–33.0)
MCHC: 32.8 g/dL (ref 32.0–36.0)
MCV: 87.1 fL (ref 80.0–100.0)
MPV: 11.5 fL (ref 7.5–12.5)
Monocytes Relative: 4 %
Neutro Abs: 3118 cells/uL (ref 1500–7800)
Neutrophils Relative %: 54.7 %
Platelets: 224 10*3/uL (ref 140–400)
RBC: 4.48 10*6/uL (ref 4.20–5.80)
RDW: 13.1 % (ref 11.0–15.0)
Total Lymphocyte: 37.1 %
WBC: 5.7 10*3/uL (ref 3.8–10.8)

## 2019-07-24 LAB — VITAMIN D 25 HYDROXY (VIT D DEFICIENCY, FRACTURES): Vit D, 25-Hydroxy: 24 ng/mL — ABNORMAL LOW (ref 30–100)

## 2019-07-24 LAB — LIPID PANEL
Cholesterol: 156 mg/dL (ref <200)
HDL: 37 mg/dL — ABNORMAL LOW (ref >=40)
LDL Cholesterol (Calc): 89 mg/dL (calc)
Non-HDL Cholesterol (Calc): 119 mg/dL (calc) (ref <130)
Total CHOL/HDL Ratio: 4.2 (calc) (ref <5.0)
Triglycerides: 198 mg/dL — ABNORMAL HIGH (ref <150)

## 2019-07-24 LAB — COMPLETE METABOLIC PANEL WITH GFR
AG Ratio: 1.5 (calc) (ref 1.0–2.5)
ALT: 12 U/L (ref 9–46)
AST: 11 U/L (ref 10–35)
Albumin: 3.8 g/dL (ref 3.6–5.1)
Alkaline phosphatase (APISO): 59 U/L (ref 35–144)
BUN/Creatinine Ratio: 11 (calc) (ref 6–22)
BUN: 16 mg/dL (ref 7–25)
CO2: 27 mmol/L (ref 20–32)
Calcium: 10.5 mg/dL — ABNORMAL HIGH (ref 8.6–10.3)
Chloride: 106 mmol/L (ref 98–110)
Creat: 1.49 mg/dL — ABNORMAL HIGH (ref 0.70–1.11)
GFR, Est African American: 50 mL/min/{1.73_m2} — ABNORMAL LOW (ref >=60)
GFR, Est Non African American: 43 mL/min/{1.73_m2} — ABNORMAL LOW (ref >=60)
Globulin: 2.5 g/dL (calc) (ref 1.9–3.7)
Glucose, Bld: 112 mg/dL — ABNORMAL HIGH (ref 65–99)
Potassium: 4.6 mmol/L (ref 3.5–5.3)
Sodium: 141 mmol/L (ref 135–146)
Total Bilirubin: 0.5 mg/dL (ref 0.2–1.2)
Total Protein: 6.3 g/dL (ref 6.1–8.1)

## 2019-07-24 LAB — PTH, INTACT AND CALCIUM
Calcium: 10.5 mg/dL — ABNORMAL HIGH (ref 8.6–10.3)
PTH: 143 pg/mL — ABNORMAL HIGH (ref 14–64)

## 2019-07-24 LAB — TSH: TSH: 2.02 mIU/L (ref 0.40–4.50)

## 2019-07-25 ENCOUNTER — Other Ambulatory Visit: Payer: Self-pay | Admitting: *Deleted

## 2019-07-25 DIAGNOSIS — E210 Primary hyperparathyroidism: Secondary | ICD-10-CM

## 2019-07-25 MED ORDER — VITAMIN D (CHOLECALCIFEROL) 25 MCG (1000 UT) PO CAPS: 1.0000 | ORAL_CAPSULE | Freq: Every day | ORAL | 11 refills | Status: DC

## 2019-09-16 ENCOUNTER — Other Ambulatory Visit: Payer: Self-pay | Admitting: Family Medicine

## 2019-09-30 ENCOUNTER — Ambulatory Visit: Payer: Medicare HMO | Admitting: "Endocrinology

## 2019-10-29 ENCOUNTER — Other Ambulatory Visit: Payer: Self-pay

## 2019-10-29 ENCOUNTER — Ambulatory Visit (INDEPENDENT_AMBULATORY_CARE_PROVIDER_SITE_OTHER): Payer: Medicare HMO | Admitting: "Endocrinology

## 2019-10-29 ENCOUNTER — Encounter: Payer: Self-pay | Admitting: "Endocrinology

## 2019-10-29 VITALS — BP 132/66 | HR 76 | Ht 66.0 in | Wt 192.2 lb

## 2019-10-29 DIAGNOSIS — E212 Other hyperparathyroidism: Secondary | ICD-10-CM

## 2019-10-29 NOTE — Progress Notes (Signed)
Endocrinology Consult Note       10/29/2019, 5:32 PM  Scott Burton is a 84 y.o.-year-old male, referred by his  [name], Modena Nunnery, MD  , for evaluation for hypercalcemia/hyperparathyroidism.   Past Medical History:  Diagnosis Date  . Arthritis   . CKD (chronic kidney disease), stage III ([place])   . GERD (gastroesophageal reflux disease)   . Gout   . Hyperlipidemia   . Hypertension   . RBBB     Past Surgical History:  Procedure Laterality Date  . COLONOSCOPY N/A 04/19/2016   Procedure: COLONOSCOPY;  Surgeon: Danie Binder, MD;  [facility]: [facility];  Service: Endoscopy;  Laterality: N/A;  1130   . NO PAST SURGERIES      Social History   Tobacco Use  . Smoking status: Former Smoker    Quit date: 01/17/1995    Years since quitting: 24.7  . Smokeless tobacco: Former Systems developer    Quit date: 01/28/1991  Vaping Use  . Vaping Use: Never used  Substance Use Topics  . Alcohol use: No  . Drug use: No    Family History  Problem Relation Age of Onset  . Cancer Father   . Dementia Mother     Outpatient Encounter Medications as of 10/29/2019  Medication Sig  . atorvastatin (LIPITOR) 20 MG tablet Take 1 tablet (20 mg total) by mouth daily.  Marland Kitchen losartan (COZAAR) 50 MG tablet TAKE ONE TABLET BY MOUTH EVERY DAY  . polyethylene glycol powder (GLYCOLAX/MIRALAX) 17 GM/SCOOP powder Take 17 g by mouth daily as needed.  . traZODone (DESYREL) 50 MG tablet Take 0.5 tablets (25 mg total) by mouth at bedtime.  . Vitamin D, Cholecalciferol, 25 MCG (1000 UT) CAPS Take 1 capsule by mouth daily.  . [DISCONTINUED] atorvastatin (LIPITOR) 10 MG tablet TAKE ONE TABLET BY MOUTH EVERY DAY   No facility-administered encounter medications on file as of 10/29/2019.    No Known Allergies   HPI  GILL DELROSSI was diagnosed with hypercalcemia at least since 2018.  Patient is a poor historian, accompanied by his daughter.  -Reportedly, he  does not have previous diagnosis of parathyroid, thyroid, pituitary, adrenal dysfunctions.  -Review of his referral package of most recent labs reveals calcium of 10.7 the corresponding PTH of 143 on on July 23, 2019.  Since 2018 he did have at least 7 occasions of elevated calcium above normal reference range.  His PTH was also elevated at 90 in October 2020.  He does have stage 3-4 renal insufficiency. -He does not have any bone density to review.  No prior history of fragility fractures or falls. No history of  kidney stones.  he is not on HCTZ or other thiazide therapy.  He has vitamin D deficiency and supplement with cholecalciferol 1000 units daily.    he is not on calcium supplements,  he eats dairy and green, leafy, vegetables on average amounts.  he does not have a family history of hypercalcemia, pituitary tumors, thyroid cancer, or osteoporosis.    ROS:  Constitutional: no weight gain/loss, no fatigue, no subjective hyperthermia, no subjective hypothermia Eyes: no blurry vision, no xerophthalmia ENT: no  sore throat, no nodules palpated in throat, no dysphagia/odynophagia, no hoarseness Cardiovascular: no Chest Pain, no Shortness of Breath, no palpitations, no leg swelling Respiratory: no cough, no shortness of breath  Gastrointestinal: no Nausea/Vomiting/Diarhhea Musculoskeletal: no muscle/joint aches Skin: no rashes Neurological: no tremors, no numbness, no tingling, no dizziness Psychiatric: no depression, no anxiety  PE: BP 132/66   Pulse 76   Ht 5\' 6"  (1.676 m)   Wt 192 lb 3.2 oz (87.2 kg)   BMI 31.02 kg/m , Body mass index is 31.02 kg/m. Wt Readings from Last 3 Encounters:  10/29/19 192 lb 3.2 oz (87.2 kg)  07/23/19 185 lb (83.9 kg)  03/18/19 180 lb (81.6 kg)    Constitutional: His BMI is 31, not in acute distress, normal state of mind Eyes: PERRLA, EOMI, no exophthalmos ENT: moist mucous membranes, no gross thyromegaly, no gross cervical  lymphadenopathy Cardiovascular: normal precordial activity, Regular Rate and Rhythm, no Murmur/Rubs/Gallops Respiratory:  adequate breathing efforts, no gross chest deformity, Clear to auscultation bilaterally Gastrointestinal: abdomen soft, Non -tender, No distension, Bowel Sounds present Musculoskeletal: no gross deformities, strength intact in all four extremities Skin: moist, warm, no rashes Neurological: no tremor with outstretched hands, Deep tendon reflexes normal in bilateral lower extremities.     CMP ( most recent) CMP     Component Value Date/Time   NA 141 07/23/2019 1101   K 4.6 07/23/2019 1101   CL 106 07/23/2019 1101   CO2 27 07/23/2019 1101   GLUCOSE 112 (H) 07/23/2019 1101   BUN 16 07/23/2019 1101   CREATININE 1.49 (H) 07/23/2019 1101   CALCIUM 10.5 (H) 07/23/2019 1101   CALCIUM 10.5 (H) 07/23/2019 1101   PROT 6.3 07/23/2019 1101   ALBUMIN 4.0 09/07/2016 0801   AST 11 07/23/2019 1101   ALT 12 07/23/2019 1101   ALKPHOS 76 09/07/2016 0801   BILITOT 0.5 07/23/2019 1101   GFRNONAA 43 (L) 07/23/2019 1101   GFRAA 50 (L) 07/23/2019 1101     Diabetic Labs (most recent): No results found for: HGBA1C   Lipid Panel ( most recent) Lipid Panel     Component Value Date/Time   CHOL 156 07/23/2019 1101   TRIG 198 (H) 07/23/2019 1101   HDL 37 (L) 07/23/2019 1101   CHOLHDL 4.2 07/23/2019 1101   VLDL 43 (H) 09/07/2016 0801   LDLCALC 89 07/23/2019 1101   LDLDIRECT 130 (H) 11/05/2018 1159      Lab Results  Component Value Date   TSH 2.02 07/23/2019   TSH 1.89 12/04/2017   TSH 1.40 04/26/2015      Assessment: 1. Hypercalcemia / Hyperparathyroidism  Plan: Patient has had several instances of elevated calcium, with the highest level being at 10.7 mg/dL. A corresponding intact PTH level was also high, at 143.  - Patient also  has vitamin D deficiency, currently on vitamin D supplement. - No apparent complications from hypercalcemia/hyperparathyroidism: no  history of  nephrolithiasis,  osteoporosis,fragility fractures. No abdominal pain, no major mood disorders, no bone pain.  - I discussed with the patient and his daughter about the physiology of calcium and parathyroid hormone, and possible  effects of  increased PTH/ Calcium , including kidney stones, cardiac dysrhythmias, osteoporosis, abdominal pain, etc.   - The work up so far is not sufficient to reach a conclusion for definitive therapy.  he  needs more studies to confirm and classify the parathyroid dysfunction he may have. I will proceed to obtain  repeat intact PTH/calcium, serum magnesium, serum phosphorus.  He will also be considered for PTH related peptide.  It is also essential to obtain 24-hour urine calcium/creatinine to rule out the rare but important cause of mild elevation in calcium and PTH- [place] ( Familial Hypocalciuric Hypercalcemia), which may not require any active intervention.  - I will request for his next DEXA scan to include the distal  33% of  radius for evaluation of cortical bone, which is predominantly affected by hyperparathyroidism.   Elevated PTH could be a component of the CKD.  He is not a surgical candidate.  If he is confirmed to significant hypercalcemia/ primary hyperparathyroidism, he will be considered for therapy with cinacalcet as appropriate.  He is advised to continue close follow-up with his PMD Dr. [name].  - Time spent with the patient: 60 minutes, of which >50% was spent in obtaining information about his symptoms, reviewing his previous labs, evaluations, and treatments, counseling him about his hypercalcemia/hyperparathyroidism, and developing a plan to confirm the diagnosis and long term treatment as necessary.  Please refer to " Patient Self Inventory" in the Media  tab for reviewed elements of pertinent patient history.  [name] participated in the discussions, expressed understanding, and voiced agreement with the above plans.  All  questions were answered to his satisfaction. he is encouraged to contact clinic should he have any questions or concerns prior to his return visit.  - Return in about 2 weeks (around 11/12/2019) for F/U with Pre-visit Labs, DXA Scan B4 NV.   Glade Lloyd, MD [facility] [address] [place], [place] [address] Phone: [phone]  Fax: [phone]    This note was partially dictated with voice recognition software. Similar sounding words can be transcribed inadequately or may not  be corrected upon review.  10/29/2019, 5:32 PM

## 2019-11-03 DIAGNOSIS — E212 Other hyperparathyroidism: Secondary | ICD-10-CM | POA: Diagnosis not present

## 2019-11-04 LAB — CREATININE, URINE, 24 HOUR
Creatinine, 24H Ur: 1331 mg/24 hr (ref 1000–2000)
Creatinine, Urine: 88.7 mg/dL

## 2019-11-04 LAB — CALCIUM, URINE, 24 HOUR
Calcium, 24H Urine: 45 mg/24 hr (ref 0–320)
Calcium, Urine: 3 mg/dL

## 2019-11-05 ENCOUNTER — Ambulatory Visit (HOSPITAL_COMMUNITY)
Admission: RE | Admit: 2019-11-05 | Discharge: 2019-11-05 | Disposition: A | Discharge status: Home / Self Care | Payer: Medicare HMO | Source: Ambulatory Visit | Attending: "Endocrinology | Admitting: "Endocrinology

## 2019-11-05 ENCOUNTER — Other Ambulatory Visit: Payer: Self-pay

## 2019-11-05 DIAGNOSIS — M85852 Other specified disorders of bone density and structure, left thigh: Secondary | ICD-10-CM | POA: Insufficient documentation

## 2019-11-05 DIAGNOSIS — E213 Hyperparathyroidism, unspecified: Secondary | ICD-10-CM | POA: Diagnosis not present

## 2019-11-05 DIAGNOSIS — Z13820 Encounter for screening for osteoporosis: Secondary | ICD-10-CM | POA: Diagnosis not present

## 2019-11-05 LAB — TSH: TSH: 2.51 u[IU]/mL (ref 0.450–4.500)

## 2019-11-05 LAB — PTH, INTACT AND CALCIUM
Calcium: 10.7 mg/dL — ABNORMAL HIGH (ref 8.6–10.2)
PTH: 119 pg/mL — ABNORMAL HIGH (ref 15–65)

## 2019-11-05 LAB — T4, FREE: Free T4: 1.22 ng/dL (ref 0.82–1.77)

## 2019-11-05 LAB — MAGNESIUM: Magnesium: 2.1 mg/dL (ref 1.6–2.3)

## 2019-11-05 LAB — PHOSPHORUS: Phosphorus: 2.5 mg/dL — ABNORMAL LOW (ref 2.8–4.1)

## 2019-11-05 LAB — PTH-RELATED PEPTIDE: PTH-related peptide: <2 pmol/L

## 2019-11-19 ENCOUNTER — Other Ambulatory Visit: Payer: Self-pay

## 2019-11-19 ENCOUNTER — Ambulatory Visit (INDEPENDENT_AMBULATORY_CARE_PROVIDER_SITE_OTHER): Payer: Medicare HMO | Admitting: "Endocrinology

## 2019-11-19 ENCOUNTER — Encounter: Payer: Self-pay | Admitting: "Endocrinology

## 2019-11-19 VITALS — BP 142/80 | HR 80 | Ht 66.0 in | Wt 192.6 lb

## 2019-11-19 DIAGNOSIS — M8580 Other specified disorders of bone density and structure, unspecified site: Secondary | ICD-10-CM | POA: Diagnosis not present

## 2019-11-19 DIAGNOSIS — E212 Other hyperparathyroidism: Secondary | ICD-10-CM

## 2019-11-19 DIAGNOSIS — E559 Vitamin D deficiency, unspecified: Secondary | ICD-10-CM | POA: Diagnosis not present

## 2019-11-19 NOTE — Progress Notes (Signed)
11/19/2019, 1:40 PM  Endocrinology follow-up note  Scott Burton is a 84 y.o.-year-old male, returning for follow-up after he was seen in consultation for hypercalcemia/hyperparathyroidism referred by his  Ruskin, Modena Nunnery, MD .  He is accompanied by his daughter.  Patient with cognitive deficit.   Past Medical History:  Diagnosis Date  . Arthritis   . CKD (chronic kidney disease), stage III ([place])   . GERD (gastroesophageal reflux disease)   . Gout   . Hyperlipidemia   . Hypertension   . RBBB     Past Surgical History:  Procedure Laterality Date  . COLONOSCOPY N/A 04/19/2016   Procedure: COLONOSCOPY;  Surgeon: Danie Binder, MD;  Location: [facility];  Service: Endoscopy;  Laterality: N/A;  1130   . NO PAST SURGERIES      Social History   Tobacco Use  . Smoking status: Former Smoker    Quit date: 01/17/1995    Years since quitting: 24.8  . Smokeless tobacco: Former Systems developer    Quit date: 01/28/1991  Vaping Use  . Vaping Use: Never used  Substance Use Topics  . Alcohol use: No  . Drug use: No    Family History  Problem Relation Age of Onset  . Cancer Father   . Dementia Mother     Outpatient Encounter Medications as of 11/19/2019  Medication Sig  . cholecalciferol (VITAMIN D3) 25 MCG (1000 UNIT) tablet Take 2,000 Units by mouth daily.  Marland Kitchen atorvastatin (LIPITOR) 20 MG tablet Take 1 tablet (20 mg total) by mouth daily.  Marland Kitchen losartan (COZAAR) 50 MG tablet TAKE ONE TABLET BY MOUTH EVERY DAY  . polyethylene glycol powder (GLYCOLAX/MIRALAX) 17 GM/SCOOP powder Take 17 g by mouth daily as needed.  . traZODone (DESYREL) 50 MG tablet Take 0.5 tablets (25 mg total) by mouth at bedtime.  . [DISCONTINUED] Vitamin D, Cholecalciferol, 25 MCG (1000 UT) CAPS Take 1 capsule by mouth daily.   No facility-administered encounter medications on file as of 11/19/2019.    No Known Allergies   HPI  Scott Burton was diagnosed with hypercalcemia at least since 2018.  Patient  is a poor historian, with cognitive deficit, accompanied by his daughter.  -Reportedly, he does not have previous diagnosis of parathyroid, thyroid, pituitary, adrenal dysfunctions.  -Review of his referral package of most recent labs reveals calcium of 10.7 the corresponding PTH of 143 on on July 23, 2019.  Since 2018 he did have at least 7 occasions of elevated calcium above normal reference range.  His PTH was also elevated at 90 in October 2020.  He does have stage 3-4 renal insufficiency. -He was sent for repeat labs since last visit, calcium remains slightly elevated at 10.7, PTH 119, phosphorus 2.5, magnesium 2.1.  His bone density study is consistent with osteopenia.  No prior history of fragility fractures or falls. No history of  kidney stones.  he is not on HCTZ or other thiazide therapy.  He has vitamin D deficiency and supplement with cholecalciferol 1000 units daily.    he is not on calcium supplements,  he eats dairy and green, leafy, vegetables on average amounts.  he does not have a family history of hypercalcemia, pituitary tumors, thyroid cancer, or osteoporosis.    ROS: Limited as above. PE: BP (!) 142/80   Pulse 80   Ht 5\' 6"  (1.676 m)   Wt 192 lb 9.6 oz (87.4 kg)   BMI 31.09 kg/m , Body  mass index is 31.09 kg/m. Wt Readings from Last 3 Encounters:  11/19/19 192 lb 9.6 oz (87.4 kg)  10/29/19 192 lb 3.2 oz (87.2 kg)  07/23/19 185 lb (83.9 kg)         CMP ( most recent) CMP     Component Value Date/Time   NA 141 07/23/2019 1101   K 4.6 07/23/2019 1101   CL 106 07/23/2019 1101   CO2 27 07/23/2019 1101   GLUCOSE 112 (H) 07/23/2019 1101   BUN 16 07/23/2019 1101   CREATININE 1.49 (H) 07/23/2019 1101   CALCIUM 10.7 (H) 10/29/2019 1154   PROT 6.3 07/23/2019 1101   ALBUMIN 4.0 09/07/2016 0801   AST 11 07/23/2019 1101   ALT 12 07/23/2019 1101   ALKPHOS 76 09/07/2016 0801   BILITOT 0.5 07/23/2019 1101   GFRNONAA 43 (L) 07/23/2019 1101   GFRAA 50 (L)  07/23/2019 1101     Lipid Panel ( most recent) Lipid Panel     Component Value Date/Time   CHOL 156 07/23/2019 1101   TRIG 198 (H) 07/23/2019 1101   HDL 37 (L) 07/23/2019 1101   CHOLHDL 4.2 07/23/2019 1101   VLDL 43 (H) 09/07/2016 0801   LDLCALC 89 07/23/2019 1101   LDLDIRECT 130 (H) 11/05/2018 1159      Lab Results  Component Value Date   TSH 2.510 10/29/2019   TSH 2.02 07/23/2019   TSH 1.89 12/04/2017   TSH 1.40 04/26/2015   FREET4 1.22 10/29/2019    November 13, 2019 bone density results: DualFemur Neck Left 11/05/2019 82.0 N/A -2.2 0.786 g/cm2  Left Forearm Radius 33% 11/05/2019 82.0 N/A -0.3 0.782 g/cm2 ASSESSMENT: BMD as determined from Femur Neck Left is 0.786 g/cm2 with a T-score of -2.2. This patient is considered osteopenic by World Healh Organization (WHO) Criteria. Lumbar spine was excluded due to advanced degenerative changes.    Assessment: 1. Hypercalcemia / Hyperparathyroidism 2.  Osteopenia  Plan: Patient has had several instances of elevated calcium, with the highest level being at 10.7 mg/dL over the last 3 years.  His PTH range from 90-143.  Most recent labs are also consistent.  24-hour urine calcium is not elevated at 45.  He will not need surgical intervention.  Etiology not settled, may still be early, mild primary hyper para versus CKD related secondary hyperparathyroidism. He is advised to increase his vitamin D3 to 2000 units daily.  He is advised to avoid any over-the-counter calcium supplements.  - No apparent major  complications from hypercalcemia/hyperparathyroidism: no history of  nephrolithiasis,  fragility fractures. No abdominal pain, no major mood disorders, no bone pain. -He is not a surgical candidate, will be considered for Sensipar therapy if he is found to have significant hypercalcemia above 11.5.  He will be put on observation only status at this time.  We will have repeat labs in 6 months. He is advised to continue close  follow-up with his PMD Dr. [name].     - Time spent on this patient care encounter:  20 minutes of which 50% was spent in  counseling and the rest reviewing  his current and  previous labs / studies and medications  doses and developing a plan for long term care. [name]  participated in the discussions, expressed understanding, and voiced agreement with the above plans.  All questions were answered to his satisfaction. he is encouraged to contact clinic should he have any questions or concerns prior to his return visit.   - Return  in about 6 months (around 05/18/2020) for F/U with Pre-visit Labs.   Glade Lloyd, MD [facility] [address] [place], [place] [address] Phone: [phone]  Fax: [phone]    This note was partially dictated with voice recognition software. Similar sounding words can be transcribed inadequately or may not  be corrected upon review.  11/19/2019, 1:40 PM

## 2019-11-25 ENCOUNTER — Encounter: Payer: Self-pay | Admitting: Family Medicine

## 2019-11-25 ENCOUNTER — Other Ambulatory Visit: Payer: Self-pay

## 2019-11-25 ENCOUNTER — Ambulatory Visit (INDEPENDENT_AMBULATORY_CARE_PROVIDER_SITE_OTHER): Payer: Medicare HMO | Admitting: Family Medicine

## 2019-11-25 VITALS — BP 130/82 | HR 66 | Temp 97.7°F | Resp 14 | Ht 66.0 in | Wt 193.0 lb

## 2019-11-25 DIAGNOSIS — I10 Essential (primary) hypertension: Secondary | ICD-10-CM | POA: Diagnosis not present

## 2019-11-25 DIAGNOSIS — M159 Polyosteoarthritis, unspecified: Secondary | ICD-10-CM

## 2019-11-25 DIAGNOSIS — E440 Moderate protein-calorie malnutrition: Secondary | ICD-10-CM

## 2019-11-25 DIAGNOSIS — Z23 Encounter for immunization: Secondary | ICD-10-CM

## 2019-11-25 DIAGNOSIS — M8949 Other hypertrophic osteoarthropathy, multiple sites: Secondary | ICD-10-CM | POA: Diagnosis not present

## 2019-11-25 DIAGNOSIS — N1831 Chronic kidney disease, stage 3a: Secondary | ICD-10-CM | POA: Diagnosis not present

## 2019-11-25 DIAGNOSIS — F32A Depression, unspecified: Secondary | ICD-10-CM | POA: Diagnosis not present

## 2019-11-25 DIAGNOSIS — E559 Vitamin D deficiency, unspecified: Secondary | ICD-10-CM | POA: Diagnosis not present

## 2019-11-25 DIAGNOSIS — E782 Mixed hyperlipidemia: Secondary | ICD-10-CM

## 2019-11-25 DIAGNOSIS — M1990 Unspecified osteoarthritis, unspecified site: Secondary | ICD-10-CM | POA: Insufficient documentation

## 2019-11-25 NOTE — Assessment & Plan Note (Signed)
Appetite much improved No changes

## 2019-11-25 NOTE — Assessment & Plan Note (Signed)
Recheck renal function and GFR Avoid NSAIDS

## 2019-11-25 NOTE — Progress Notes (Signed)
   Subjective:    Patient ID: Scott Burton, male    DOB: Dec 26, 1935, 84 y.o.   MRN: [id number]  Patient presents for Follow-up (is fasting)  Pt here to f/u chronic medical problems  His appetite is better, weight up 8lbs since July  No change in bowels or urinary pattern He feels good,  He goes to church and to  [place] to shop   Arthritis in his hands, he tried topical rub it didn't help, states his hands get stiff, no swelling   HTN- taking bp meds losartan 25mg  once a day    Hyperparathyroidism, he was seen by endocrinology he is on vitamin D 2000IU daily, due for recheck   Review Of Systems:  GEN- denies fatigue, fever, weight loss,weakness, recent illness HEENT- denies eye drainage, change in vision, nasal discharge, CVS- denies chest pain, palpitations RESP- denies SOB, cough, wheeze ABD- denies N/V, change in stools, abd pain GU- denies dysuria, hematuria, dribbling, incontinence MSK- denies joint pain, muscle aches, injury Neuro- denies headache, dizziness, syncope, seizure activity       Objective:    BP 130/82   Pulse 66   Temp 97.7 F (36.5 C) (Temporal)   Resp 14   Ht 5\' 6"  (1.676 m)   Wt 193 lb (87.5 kg)   SpO2 98%   BMI 31.15 kg/m  GEN- NAD, alert and oriented x3 HEENT- PERRL, EOMI, non injected sclera, pink conjunctiva, MMM, oropharynx clear Neck- Supple, no thyromegaly CVS- RRR, soft systolic murmur RESP-CTAB ABD-NABS,soft,NT,ND MSK - able to make fist bilat, no swelling hands, no severe curvatures or deformity  EXT- No edema Pulses- Radial, DP- 2+        Assessment & Plan:       Problem List Items Addressed This Visit      Unprioritized   CKD (chronic kidney disease) stage 3, GFR 30-59 ml/min (HCC) - Primary    Recheck renal function and GFR Avoid NSAIDS      Relevant Orders   COMPLETE METABOLIC PANEL WITH GFR   CBC with Differential/Platelet   Depression    He is doing well with trazodone      Essential hypertension,  benign    Controlled no changes       Relevant Orders   Lipid panel   Hyperlipidemia   Relevant Orders   Lipid panel   Osteoarthritis    Given tylenol for pain, also topical voltaren gel sample to try       Protein-calorie malnutrition ([place])    Appetite much improved No changes       Vitamin D deficiency   Relevant Orders   Vitamin D, 25-hydroxy      Note: This dictation was prepared with Dragon dictation along with smaller phrase technology. Any transcriptional errors that result from this process are unintentional.

## 2019-11-25 NOTE — Assessment & Plan Note (Signed)
He is doing well with trazodone

## 2019-11-25 NOTE — Assessment & Plan Note (Signed)
Controlled no changes 

## 2019-11-25 NOTE — Patient Instructions (Signed)
Tylenol for pain either  500 or  650mg  twice a day Flu shot given We will call with lab results  F/U 4 months

## 2019-11-25 NOTE — Assessment & Plan Note (Signed)
Given tylenol for pain, also topical voltaren gel sample to try

## 2019-11-26 LAB — LIPID PANEL
Cholesterol: 161 mg/dL (ref <200)
HDL: 36 mg/dL — ABNORMAL LOW (ref >=40)
LDL Cholesterol (Calc): 91 mg/dL (calc)
Non-HDL Cholesterol (Calc): 125 mg/dL (calc) (ref <130)
Total CHOL/HDL Ratio: 4.5 (calc) (ref <5.0)
Triglycerides: 260 mg/dL — ABNORMAL HIGH (ref <150)

## 2019-11-26 LAB — COMPLETE METABOLIC PANEL WITH GFR
AG Ratio: 1.4 (calc) (ref 1.0–2.5)
ALT: 13 U/L (ref 9–46)
AST: 16 U/L (ref 10–35)
Albumin: 3.9 g/dL (ref 3.6–5.1)
Alkaline phosphatase (APISO): 64 U/L (ref 35–144)
BUN/Creatinine Ratio: 12 (calc) (ref 6–22)
BUN: 19 mg/dL (ref 7–25)
CO2: 27 mmol/L (ref 20–32)
Calcium: 10.7 mg/dL — ABNORMAL HIGH (ref 8.6–10.3)
Chloride: 104 mmol/L (ref 98–110)
Creat: 1.54 mg/dL — ABNORMAL HIGH (ref 0.70–1.11)
GFR, Est African American: 48 mL/min/{1.73_m2} — ABNORMAL LOW (ref >=60)
GFR, Est Non African American: 41 mL/min/{1.73_m2} — ABNORMAL LOW (ref >=60)
Globulin: 2.7 g/dL (calc) (ref 1.9–3.7)
Glucose, Bld: 93 mg/dL (ref 65–99)
Potassium: 4.5 mmol/L (ref 3.5–5.3)
Sodium: 140 mmol/L (ref 135–146)
Total Bilirubin: 0.5 mg/dL (ref 0.2–1.2)
Total Protein: 6.6 g/dL (ref 6.1–8.1)

## 2019-11-26 LAB — CBC WITH DIFFERENTIAL/PLATELET
Absolute Monocytes: 377 cells/uL (ref 200–950)
Basophils Absolute: 52 cells/uL (ref 0–200)
Basophils Relative: 0.9 %
Eosinophils Absolute: 168 cells/uL (ref 15–500)
Eosinophils Relative: 2.9 %
HCT: 42.1 % (ref 38.5–50.0)
Hemoglobin: 13.4 g/dL (ref 13.2–17.1)
Lymphs Abs: 2105 cells/uL (ref 850–3900)
MCH: 28.3 pg (ref 27.0–33.0)
MCHC: 31.8 g/dL — ABNORMAL LOW (ref 32.0–36.0)
MCV: 89 fL (ref 80.0–100.0)
MPV: 11.4 fL (ref 7.5–12.5)
Monocytes Relative: 6.5 %
Neutro Abs: 3097 cells/uL (ref 1500–7800)
Neutrophils Relative %: 53.4 %
Platelets: 206 10*3/uL (ref 140–400)
RBC: 4.73 10*6/uL (ref 4.20–5.80)
RDW: 12.4 % (ref 11.0–15.0)
Total Lymphocyte: 36.3 %
WBC: 5.8 10*3/uL (ref 3.8–10.8)

## 2019-11-26 LAB — VITAMIN D 25 HYDROXY (VIT D DEFICIENCY, FRACTURES): Vit D, 25-Hydroxy: 24 ng/mL — ABNORMAL LOW (ref 30–100)

## 2020-02-11 ENCOUNTER — Other Ambulatory Visit: Payer: Self-pay | Admitting: Family Medicine

## 2020-02-17 ENCOUNTER — Other Ambulatory Visit: Payer: Self-pay | Admitting: Family Medicine

## 2020-02-20 ENCOUNTER — Other Ambulatory Visit: Payer: Self-pay | Admitting: Family Medicine

## 2020-03-15 ENCOUNTER — Other Ambulatory Visit: Payer: Self-pay | Admitting: *Deleted

## 2020-03-15 MED ORDER — TRAZODONE HCL 50 MG PO TABS
ORAL_TABLET | ORAL | 3 refills | Status: DC
Start: 2020-03-15 — End: 2020-03-24

## 2020-03-15 MED ORDER — ATORVASTATIN CALCIUM 10 MG PO TABS
10.0000 mg | ORAL_TABLET | Freq: Every day | ORAL | 3 refills | Status: DC
Start: 2020-03-15 — End: 2020-03-24

## 2020-03-15 MED ORDER — LOSARTAN POTASSIUM 50 MG PO TABS: 50.0000 mg | ORAL_TABLET | Freq: Every day | ORAL | 3 refills | Status: DC

## 2020-03-17 ENCOUNTER — Telehealth: Payer: Self-pay

## 2020-03-17 ENCOUNTER — Telehealth: Payer: Self-pay | Admitting: Family Medicine

## 2020-03-17 NOTE — Telephone Encounter (Signed)
Closing encounter, no action needed at this time

## 2020-03-17 NOTE — Telephone Encounter (Signed)
Informed grand daughter that all meds have already been filled

## 2020-03-17 NOTE — Telephone Encounter (Signed)
Pt is requesting that these meds now be sent to [facility]  traZODone (DESYREL) 50 MG tablet losartan (COZAAR) 50 MG tablet  atorvastatin (LIPITOR) 10 MG tablet

## 2020-03-18 ENCOUNTER — Other Ambulatory Visit: Payer: Self-pay

## 2020-03-18 ENCOUNTER — Ambulatory Visit (INDEPENDENT_AMBULATORY_CARE_PROVIDER_SITE_OTHER): Payer: Medicare HMO | Admitting: Nurse Practitioner

## 2020-03-18 ENCOUNTER — Encounter: Payer: Self-pay | Admitting: Nurse Practitioner

## 2020-03-18 VITALS — BP 132/82 | HR 84 | Temp 97.7°F | Ht 66.0 in | Wt 192.6 lb

## 2020-03-18 DIAGNOSIS — E559 Vitamin D deficiency, unspecified: Secondary | ICD-10-CM | POA: Diagnosis not present

## 2020-03-18 DIAGNOSIS — I10 Essential (primary) hypertension: Secondary | ICD-10-CM

## 2020-03-18 DIAGNOSIS — E782 Mixed hyperlipidemia: Secondary | ICD-10-CM

## 2020-03-18 DIAGNOSIS — R4189 Other symptoms and signs involving cognitive functions and awareness: Secondary | ICD-10-CM

## 2020-03-18 DIAGNOSIS — M8580 Other specified disorders of bone density and structure, unspecified site: Secondary | ICD-10-CM | POA: Diagnosis not present

## 2020-03-18 DIAGNOSIS — K5901 Slow transit constipation: Secondary | ICD-10-CM | POA: Diagnosis not present

## 2020-03-18 DIAGNOSIS — N1831 Chronic kidney disease, stage 3a: Secondary | ICD-10-CM

## 2020-03-18 DIAGNOSIS — E212 Other hyperparathyroidism: Secondary | ICD-10-CM | POA: Diagnosis not present

## 2020-03-18 NOTE — Progress Notes (Signed)
Subjective:    Patient ID: [name], male    DOB: 1935/05/05, 85 y.o.   MRN: [id number]  HPI: Scott Burton is a 85 y.o. male presenting for "Do not know why I am here ".  Present physically in room with daughter, Scott Burton. Scott Burton, granddaughter and primary caregiver, present over the phone.   Chief Complaint  Patient presents with   Memory Loss    Wanting to set up mri to chk on brain function. Also having dizziness when not drinking enough water   MEMORY CHANGES Onset: Ongoing Duration: Years Forgetfulness: yes Previous evaluation: No formal evaluation-desiring today Living arrangements: son, son cooks in looks after patient.  Scott Burton, granddaughter provides medication and checks on patient as well.   Falls: Some, becoming more frequent.  Last fall was Sunday at church, reports he fell sideways and hit his head.  They are associated with dizziness.    DIZZINESS Drinks 3 16oz. Bottles of water per day.  Duration: does not remember long long this has been going on for Description of symptoms: light-headed Duration of episode:seconds to minutes Dizziness frequency: once per week Aggravating/provoking factors:  Going down the steps, Triggered by rolling over in bed: no Triggered by bending over: yes Aggravated by head movement: no Aggravated by exertion: Aggravated by coughing: Aggravated by loud noises: no Recent head injury:  No; fell and hit side of head Recent or current viral symptoms: no History of vasovagal episodes: no Nausea: no Vomiting: no Tinnitus: no Hearing loss: no Aural fullness: no Headache: no Unsteady gait: yes Postural instability: no Diplopia: no Dysarthria: no Dysphagia:no Weakness: yes Related to exertion: yes Pallor: no Diaphoresis: no Dyspnea: no Chest pain: no  HYPERTENSION Hypertension status: controlled  Satisfied with current treatment? yes Duration of hypertension: chronic BP monitoring frequency:  not checking BP  medication side effects:  no Medication compliance: excellent Aspirin: no Recurrent headaches: no Visual changes: no Palpitations: no Dyspnea: no Chest pain: no Lower extremity edema: no Dizzy/lightheaded: yes  OSTEOPENIA Satisfied with current treatment?: not currently on treatment Medication side effects: no Medication compliance: n/a Past osteoporosis medications/treatments: no Adequate calcium & vitamin D: yes Intolerance to bisphosphonates:n/a Weight bearing exercises: no  CHRONIC KIDNEY DISEASE CKD status: stable GFR 48-50 in last year Medications renally dose: yes Previous renal evaluation: no Pneumovax:  Not up to Date Influenza Vaccine:  Up to Date  No Known Allergies  Outpatient Encounter Medications as of 03/18/2020  Medication Sig   atorvastatin (LIPITOR) 10 MG tablet Take 1 tablet (10 mg total) by mouth daily.   cholecalciferol (VITAMIN D3) 25 MCG (1000 UNIT) tablet Take 2,000 Units by mouth daily.   losartan (COZAAR) 50 MG tablet Take 1 tablet (50 mg total) by mouth daily.   polyethylene glycol powder (GLYCOLAX/MIRALAX) 17 GM/SCOOP powder Take 17 g by mouth daily as needed.   traZODone (DESYREL) 50 MG tablet TAKE 1/2 TABLET(25 MG) BY MOUTH AT BEDTIME   No facility-administered encounter medications on file as of 03/18/2020.    Patient Active Problem List   Diagnosis Date Noted   Osteoarthritis 11/25/2019   Osteopenia 11/19/2019   Hypercalcemia 10/29/2019   Gait instability 03/18/2019   Protein-calorie malnutrition ([place]) 03/18/2019   Vitamin D deficiency 11/15/2018   Other hyperparathyroidism ([place]) 11/15/2018   Depression 11/05/2018   Cognitive changes 11/05/2018   Constipation 11/05/2018   DDD (degenerative disc disease), lumbar 06/11/2018   Gout 08/10/2015   CKD (chronic kidney disease) stage 3, GFR 30-59 ml/min (HCC)  04/26/2015   Hyperlipidemia 12/25/2014   Essential hypertension, benign 09/25/2014    Past Medical History:   Diagnosis Date   Arthritis    CKD (chronic kidney disease), stage III (HCC)    GERD (gastroesophageal reflux disease)    Gout    Hyperlipidemia    Hypertension    RBBB     Relevant past medical, surgical, family and social history reviewed and updated as indicated. Interim medical history since our last visit reviewed.  Review of Systems Per HPI unless specifically indicated above     Objective:    BP 132/82    Pulse 84    Temp 97.7 F (36.5 C)    Ht 5\' 6"  (1.676 m)    Wt 192 lb 9.6 oz (87.4 kg)    SpO2 97%    BMI 31.09 kg/m   Wt Readings from Last 3 Encounters:  03/18/20 192 lb 9.6 oz (87.4 kg)  11/25/19 193 lb (87.5 kg)  11/19/19 192 lb 9.6 oz (87.4 kg)    Physical Exam Vitals and nursing note reviewed.  Constitutional:      General: He is not in acute distress.    Appearance: He is not toxic-appearing.  HENT:     Head: Normocephalic and atraumatic. No abrasion, contusion, masses or laceration.     Mouth/Throat:     Mouth: Mucous membranes are moist.     Pharynx: Oropharynx is clear.  Eyes:     General: No scleral icterus.       Right eye: No discharge.        Left eye: No discharge.     Extraocular Movements: Extraocular movements intact.     Pupils: Pupils are equal, round, and reactive to light.  Cardiovascular:     Rate and Rhythm: Normal rate and regular rhythm.     Heart sounds: Normal heart sounds. No murmur heard.   Pulmonary:     Effort: Pulmonary effort is normal. No respiratory distress.     Breath sounds: Normal breath sounds. No wheezing, rhonchi or rales.  Musculoskeletal:     Cervical back: Normal range of motion and neck supple.  Lymphadenopathy:     Cervical: No cervical adenopathy.  Skin:    General: Skin is warm and dry.     Capillary Refill: Capillary refill takes less than 2 seconds.     Coloration: Skin is not jaundiced or pale.     Findings: No erythema.  Neurological:     Mental Status: He is alert and oriented to person,  place, and time. Mental status is at baseline.     Cranial Nerves: Cranial nerves are intact.     Sensory: Sensation is intact.     Motor: Motor function is intact.     Coordination: Coordination is intact.     Gait: Gait is intact.    MMSE - Mini Mental State Exam 03/18/2020  Orientation to time 5  Orientation to Place 5  Registration 1  Attention/ Calculation 0  Recall 2  Language- name 2 objects 2  Language- repeat 0  Language- follow 3 step command 2  Language- read & follow direction 1  Write a sentence 0  Copy design 0  Total score 18      Assessment & Plan:   Problem List Items Addressed This Visit      Cardiovascular and Mediastinum   Essential hypertension, benign    Chronic, stable.  Continue losartan 50 mg for now.  If dizziness/falls worsen despite adequate  hydration, consider decreasing dose.  CMP checked today.      Relevant Orders   COMPLETE METABOLIC PANEL WITH GFR   CBC with Differential     Endocrine   Other hyperparathyroidism (HCC)    Chronic, stable.  Follows with Endocrine, next visit in May.  Continue this collaboration.        Musculoskeletal and Integument   Osteopenia    Chronic, stable.  Concern for increased risk of fractures given recent falls.  Will check Vitamin D levels and continue replacement of over-the-counter vitamin D for now.          Genitourinary   CKD (chronic kidney disease) stage 3, GFR 30-59 ml/min (HCC) - Primary    Chronic, stable.  Continue to renally-dose medications.  Push water, especially with upcoming warmer months.  CBC, CMP to be checked today.  Continue losartan 50 mg.  Due for pneumonia vaccine - consider at next visit.  Follow-up in 4 months.      Relevant Orders   COMPLETE METABOLIC PANEL WITH GFR   CBC with Differential     Other   Cognitive changes    Chronic.  MMSE 18/30 today and daughter/grand daughter report history of "mild dementia".  CT head in 2018 showed "mild atrophic changes."  No red  flags in history or on examination today - no indications for repeat scan of brain.  Will place referral to Neurology for full evaluation and treatment of cognitive change.  Follow-up in 4 months or with any changes.      Relevant Orders   Ambulatory referral to Neurology   Constipation    Stable with as needed MiraLAX.  Will continue this.  CBC checked today.      Hyperlipidemia    Chronic, previously stable.  Continue atorvastatin 10 mg for now; will check lipids today as patient is fasting.  CMP checked as well.      Relevant Orders   Lipid Panel   Vitamin D deficiency    Continue vitamin D over-the-counter supplementation for now.  Vitamin D level checked today.      Relevant Orders   VITAMIN D 25 Hydroxy (Vit-D Deficiency, Fractures)       Follow up plan: Return in about 1 month (around 04/18/2020) for chronic disease follow up.

## 2020-03-18 NOTE — Assessment & Plan Note (Signed)
Stable with as needed MiraLAX.  Will continue this.  CBC checked today.

## 2020-03-18 NOTE — Assessment & Plan Note (Addendum)
Chronic, stable.  Continue to renally-dose medications.  Push water, especially with upcoming warmer months.  CBC, CMP to be checked today.  Continue losartan 50 mg.  Due for pneumonia vaccine - consider at next visit.  Follow-up in 4 months.

## 2020-03-18 NOTE — Patient Instructions (Signed)
F/u in 4 months

## 2020-03-18 NOTE — Assessment & Plan Note (Signed)
Chronic, stable.  Continue losartan 50 mg for now.  If dizziness/falls worsen despite adequate hydration, consider decreasing dose.  CMP checked today.

## 2020-03-18 NOTE — Assessment & Plan Note (Addendum)
Chronic, stable.  Concern for increased risk of fractures given recent falls.  Will check Vitamin D levels and continue replacement of over-the-counter vitamin D for now.

## 2020-03-18 NOTE — Assessment & Plan Note (Signed)
Chronic, stable.  Follows with Endocrine, next visit in May.  Continue this collaboration.

## 2020-03-18 NOTE — Assessment & Plan Note (Signed)
Continue vitamin D over-the-counter supplementation for now.  Vitamin D level checked today.

## 2020-03-18 NOTE — Assessment & Plan Note (Addendum)
Chronic.  MMSE 18/30 today and daughter/grand daughter report history of "mild dementia".  CT head in 2018 showed "mild atrophic changes."  No red flags in history or on examination today - no indications for repeat scan of brain.  Will place referral to Neurology for full evaluation and treatment of cognitive change.  Follow-up in 4 months or with any changes.

## 2020-03-18 NOTE — Assessment & Plan Note (Signed)
Chronic, previously stable.  Continue atorvastatin 10 mg for now; will check lipids today as patient is fasting.  CMP checked as well.

## 2020-03-19 LAB — CBC WITH DIFFERENTIAL/PLATELET
Absolute Monocytes: 366 cells/uL (ref 200–950)
Basophils Absolute: 68 cells/uL (ref 0–200)
Basophils Relative: 1.1 %
Eosinophils Absolute: 186 cells/uL (ref 15–500)
Eosinophils Relative: 3 %
HCT: 40.4 % (ref 38.5–50.0)
Hemoglobin: 13.3 g/dL (ref 13.2–17.1)
Lymphs Abs: 2647 cells/uL (ref 850–3900)
MCH: 29 pg (ref 27.0–33.0)
MCHC: 32.9 g/dL (ref 32.0–36.0)
MCV: 88.2 fL (ref 80.0–100.0)
MPV: 11.3 fL (ref 7.5–12.5)
Monocytes Relative: 5.9 %
Neutro Abs: 2933 cells/uL (ref 1500–7800)
Neutrophils Relative %: 47.3 %
Platelets: 218 10*3/uL (ref 140–400)
RBC: 4.58 10*6/uL (ref 4.20–5.80)
RDW: 12.6 % (ref 11.0–15.0)
Total Lymphocyte: 42.7 %
WBC: 6.2 10*3/uL (ref 3.8–10.8)

## 2020-03-19 LAB — COMPLETE METABOLIC PANEL WITH GFR
AG Ratio: 1.4 (calc) (ref 1.0–2.5)
ALT: 14 U/L (ref 9–46)
AST: 18 U/L (ref 10–35)
Albumin: 4.1 g/dL (ref 3.6–5.1)
Alkaline phosphatase (APISO): 74 U/L (ref 35–144)
BUN/Creatinine Ratio: 13 (calc) (ref 6–22)
BUN: 18 mg/dL (ref 7–25)
CO2: 18 mmol/L — ABNORMAL LOW (ref 20–32)
Calcium: 10.7 mg/dL — ABNORMAL HIGH (ref 8.6–10.3)
Chloride: 108 mmol/L (ref 98–110)
Creat: 1.38 mg/dL — ABNORMAL HIGH (ref 0.70–1.11)
GFR, Est African American: 55 mL/min/{1.73_m2} — ABNORMAL LOW (ref >=60)
GFR, Est Non African American: 47 mL/min/{1.73_m2} — ABNORMAL LOW (ref >=60)
Globulin: 2.9 g/dL (calc) (ref 1.9–3.7)
Glucose, Bld: 87 mg/dL (ref 65–99)
Potassium: 4.4 mmol/L (ref 3.5–5.3)
Sodium: 140 mmol/L (ref 135–146)
Total Bilirubin: 0.5 mg/dL (ref 0.2–1.2)
Total Protein: 7 g/dL (ref 6.1–8.1)

## 2020-03-19 LAB — LIPID PANEL
Cholesterol: 163 mg/dL (ref <200)
HDL: 38 mg/dL — ABNORMAL LOW (ref >=40)
LDL Cholesterol (Calc): 100 mg/dL (calc) — ABNORMAL HIGH
Non-HDL Cholesterol (Calc): 125 mg/dL (calc) (ref <130)
Total CHOL/HDL Ratio: 4.3 (calc) (ref <5.0)
Triglycerides: 149 mg/dL (ref <150)

## 2020-03-19 LAB — VITAMIN D 25 HYDROXY (VIT D DEFICIENCY, FRACTURES): Vit D, 25-Hydroxy: 40 ng/mL (ref 30–100)

## 2020-03-24 ENCOUNTER — Other Ambulatory Visit: Payer: Self-pay | Admitting: *Deleted

## 2020-03-24 MED ORDER — LOSARTAN POTASSIUM 50 MG PO TABS: 50.0000 mg | ORAL_TABLET | Freq: Every day | ORAL | 3 refills | Status: DC

## 2020-03-24 MED ORDER — TRAZODONE HCL 50 MG PO TABS: ORAL_TABLET | ORAL | 3 refills | Status: DC

## 2020-03-24 MED ORDER — ATORVASTATIN CALCIUM 10 MG PO TABS: 10.0000 mg | ORAL_TABLET | Freq: Every day | ORAL | 3 refills | Status: DC

## 2020-03-24 NOTE — Progress Notes (Signed)
Called several times to give results for pt.

## 2020-05-12 ENCOUNTER — Ambulatory Visit (INDEPENDENT_AMBULATORY_CARE_PROVIDER_SITE_OTHER): Payer: Medicare HMO | Admitting: Nurse Practitioner

## 2020-05-12 ENCOUNTER — Encounter: Payer: Self-pay | Admitting: Nurse Practitioner

## 2020-05-12 ENCOUNTER — Other Ambulatory Visit: Payer: Self-pay

## 2020-05-12 VITALS — BP 120/68 | HR 85 | Temp 97.8°F | Wt 192.6 lb

## 2020-05-12 DIAGNOSIS — E212 Other hyperparathyroidism: Secondary | ICD-10-CM | POA: Diagnosis not present

## 2020-05-12 DIAGNOSIS — E559 Vitamin D deficiency, unspecified: Secondary | ICD-10-CM | POA: Diagnosis not present

## 2020-05-12 DIAGNOSIS — M79641 Pain in right hand: Secondary | ICD-10-CM

## 2020-05-12 MED ORDER — DICLOFENAC SODIUM 1 % EX GEL: 4.0000 g | Freq: Four times a day (QID) | CUTANEOUS | 0 refills | Status: DC

## 2020-05-12 NOTE — Progress Notes (Signed)
Subjective:    Patient ID: Scott Burton, male    DOB: 02-15-1935, 85 y.o.   MRN: [id number]  HPI: Scott Burton is a 85 y.o. male presenting with daughter for hand pain.  Chief Complaint  Patient presents with  . Hand Pain   HAND PAIN Duration: weeks Involved hand: right Mechanism of injury: unknown Location: across hand Onset: gradual Severity: medium Quality: unable to tell Frequency: multiple times per day every day Radiation: no Aggravating factors: movement Alleviating factors: nothing Treatments attempted: nothing tried Relief with NSAIDs?: nothing Weakness: no Numbness: no Redness: no Swelling:no Bruising: no Fevers: no  No Known Allergies  Outpatient Encounter Medications as of 05/12/2020  Medication Sig  . diclofenac Sodium (VOLTAREN) 1 % GEL Apply 4 g topically 4 (four) times daily.  Marland Kitchen atorvastatin (LIPITOR) 10 MG tablet Take 1 tablet (10 mg total) by mouth daily.  . cholecalciferol (VITAMIN D3) 25 MCG (1000 UNIT) tablet Take 2,000 Units by mouth daily.  Marland Kitchen losartan (COZAAR) 50 MG tablet Take 1 tablet (50 mg total) by mouth daily.  . polyethylene glycol powder (GLYCOLAX/MIRALAX) 17 GM/SCOOP powder Take 17 g by mouth daily as needed.  . traZODone (DESYREL) 50 MG tablet TAKE 1/2 TABLET(25 MG) BY MOUTH AT BEDTIME   No facility-administered encounter medications on file as of 05/12/2020.    Patient Active Problem List   Diagnosis Date Noted  . Osteoarthritis 11/25/2019  . Osteopenia 11/19/2019  . Hypercalcemia 10/29/2019  . Gait instability 03/18/2019  . Protein-calorie malnutrition ([place]) 03/18/2019  . Vitamin D deficiency 11/15/2018  . Other hyperparathyroidism ([place]) 11/15/2018  . Depression 11/05/2018  . Cognitive changes 11/05/2018  . Constipation 11/05/2018  . DDD (degenerative disc disease), lumbar 06/11/2018  . Gout 08/10/2015  . CKD (chronic kidney disease) stage 3, GFR 30-59 ml/min (HCC) 04/26/2015  . Hyperlipidemia 12/25/2014  .  Essential hypertension, benign 09/25/2014    Past Medical History:  Diagnosis Date  . Arthritis   . CKD (chronic kidney disease), stage III ([place])   . GERD (gastroesophageal reflux disease)   . Gout   . Hyperlipidemia   . Hypertension   . RBBB     Relevant past medical, surgical, family and social history reviewed and updated as indicated. Interim medical history since our last visit reviewed.  Review of Systems Per HPI unless specifically indicated above     Objective:    BP 120/68   Pulse 85   Temp 97.8 F (36.6 C)   Wt 192 lb 9.6 oz (87.4 kg)   SpO2 98%   BMI 31.09 kg/m   Wt Readings from Last 3 Encounters:  05/12/20 192 lb 9.6 oz (87.4 kg)  03/18/20 192 lb 9.6 oz (87.4 kg)  11/25/19 193 lb (87.5 kg)    Physical Exam Vitals and nursing note reviewed.  Constitutional:      General: He is not in acute distress.    Appearance: Normal appearance. He is obese. He is not toxic-appearing.  HENT:     Head: Normocephalic and atraumatic.  Musculoskeletal:     Right shoulder: Normal. No swelling or deformity.     Left shoulder: Normal. No swelling or deformity.     Right upper arm: Normal. No swelling or bony tenderness.     Left upper arm: Normal. No bony tenderness.     Right forearm: Normal. No swelling.     Left forearm: Normal. No swelling.     Right wrist: Normal. No swelling.  Left wrist: Normal. No swelling.     Right hand: No tenderness or bony tenderness. Decreased range of motion. Decreased strength of finger abduction. Normal sensation. Normal capillary refill. Normal pulse.     Left hand: Decreased strength of finger abduction. Normal sensation. Normal capillary refill. Normal pulse.  Skin:    General: Skin is warm and dry.     Coloration: Skin is not jaundiced or pale.     Findings: No erythema.  Neurological:     Mental Status: He is alert and oriented to person, place, and time.  Psychiatric:        Mood and Affect: Mood normal.        Behavior:  Behavior normal.        Thought Content: Thought content normal.        Judgment: Judgment normal.     Results for orders placed or performed in visit on 03/18/20  COMPLETE METABOLIC PANEL WITH GFR  Result Value Ref Range   Glucose, Bld 87 65 - 99 mg/dL   BUN 18 7 - 25 mg/dL   Creat 1.38 (H) 0.70 - 1.11 mg/dL   GFR, Est Non African American 47 (L) > OR = 60 mL/min/1.58m2   GFR, Est African American 55 (L) > OR = 60 mL/min/1.13m2   BUN/Creatinine Ratio 13 6 - 22 (calc)   Sodium 140 135 - 146 mmol/L   Potassium 4.4 3.5 - 5.3 mmol/L   Chloride 108 98 - 110 mmol/L   CO2 18 (L) 20 - 32 mmol/L   Calcium 10.7 (H) 8.6 - 10.3 mg/dL   Total Protein 7.0 6.1 - 8.1 g/dL   Albumin 4.1 3.6 - 5.1 g/dL   Globulin 2.9 1.9 - 3.7 g/dL (calc)   AG Ratio 1.4 1.0 - 2.5 (calc)   Total Bilirubin 0.5 0.2 - 1.2 mg/dL   Alkaline phosphatase (APISO) 74 35 - 144 U/L   AST 18 10 - 35 U/L   ALT 14 9 - 46 U/L  VITAMIN D 25 Hydroxy (Vit-D Deficiency, Fractures)  Result Value Ref Range   Vit D, 25-Hydroxy 40 30 - 100 ng/mL  CBC with Differential  Result Value Ref Range   WBC 6.2 3.8 - 10.8 Thousand/uL   RBC 4.58 4.20 - 5.80 Million/uL   Hemoglobin 13.3 13.2 - 17.1 g/dL   HCT 40.4 38.5 - 50.0 %   MCV 88.2 80.0 - 100.0 fL   MCH 29.0 27.0 - 33.0 pg   MCHC 32.9 32.0 - 36.0 g/dL   RDW 12.6 11.0 - 15.0 %   Platelets 218 140 - 400 Thousand/uL   MPV 11.3 7.5 - 12.5 fL   Neutro Abs 2,933 1,500 - 7,800 cells/uL   Lymphs Abs 2,647 850 - 3,900 cells/uL   Absolute Monocytes 366 200 - 950 cells/uL   Eosinophils Absolute 186 15 - 500 cells/uL   Basophils Absolute 68 0 - 200 cells/uL   Neutrophils Relative % 47.3 %   Total Lymphocyte 42.7 %   Monocytes Relative 5.9 %   Eosinophils Relative 3.0 %   Basophils Relative 1.1 %  Lipid Panel  Result Value Ref Range   Cholesterol 163 <200 mg/dL   HDL 38 (L) > OR = 40 mg/dL   Triglycerides 149 <150 mg/dL   LDL Cholesterol (Calc) 100 (H) mg/dL (calc)   Total CHOL/HDL  Ratio 4.3 <5.0 (calc)   Non-HDL Cholesterol (Calc) 125 <130 mg/dL (calc)      Assessment & Plan:  1. Pain of  right hand Acute on chronic.  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  Will not start on oral NSAIDs due to kidney function.  Will start topical NSAID.  With no improvement, return to clinic.  Consider hand x-ray vs. Referral to Hand Specialist in future if symptoms persist without improvement.  - diclofenac Sodium (VOLTAREN) 1 % GEL; Apply 4 g topically 4 (four) times daily.  Dispense: 50 g; Refill: 0     Follow up plan: Return if symptoms worsen or fail to improve.

## 2020-05-13 LAB — PTH, INTACT AND CALCIUM
Calcium: 11 mg/dL — ABNORMAL HIGH (ref 8.6–10.2)
PTH: 101 pg/mL — ABNORMAL HIGH (ref 15–65)

## 2020-05-13 LAB — VITAMIN D 25 HYDROXY (VIT D DEFICIENCY, FRACTURES): Vit D, 25-Hydroxy: 23.7 ng/mL — ABNORMAL LOW (ref 30.0–100.0)

## 2020-05-18 ENCOUNTER — Ambulatory Visit (INDEPENDENT_AMBULATORY_CARE_PROVIDER_SITE_OTHER): Payer: Medicare HMO | Admitting: "Endocrinology

## 2020-05-18 ENCOUNTER — Encounter: Payer: Self-pay | Admitting: "Endocrinology

## 2020-05-18 ENCOUNTER — Other Ambulatory Visit: Payer: Self-pay

## 2020-05-18 VITALS — BP 128/58 | HR 100 | Ht 66.0 in | Wt 194.0 lb

## 2020-05-18 DIAGNOSIS — E212 Other hyperparathyroidism: Secondary | ICD-10-CM | POA: Diagnosis not present

## 2020-05-18 DIAGNOSIS — E559 Vitamin D deficiency, unspecified: Secondary | ICD-10-CM

## 2020-05-18 MED ORDER — VITAMIN D3 125 MCG (5000 UT) PO CAPS: 5000.0000 [IU] | ORAL_CAPSULE | Freq: Every day | ORAL | 1 refills | Status: DC

## 2020-05-18 MED ORDER — CINACALCET HCL 30 MG PO TABS: 30.0000 mg | ORAL_TABLET | Freq: Every day | ORAL | 1 refills | Status: DC

## 2020-05-18 NOTE — Progress Notes (Signed)
05/18/2020, 2:35 PM  Endocrinology follow-up note  Scott Burton is a 85 y.o.-year-old male, returning for follow-up after he was seen in consultation for hypercalcemia/hyperparathyroidism referred by his  [name], MD .  He is not  accompanied by any family member today. Patient with cognitive deficit. Will get a hold of his granddaughter Scott Burton who is helping him with his medical decisions and communicated the care plan with her.      Past Medical History:  Diagnosis Date  . Arthritis   . CKD (chronic kidney disease), stage III ([place])   . GERD (gastroesophageal reflux disease)   . Gout   . Hyperlipidemia   . Hypertension   . RBBB     Past Surgical History:  Procedure Laterality Date  . COLONOSCOPY N/A 04/19/2016   Procedure: COLONOSCOPY;  Surgeon: Danie Binder, MD;  Location: [facility];  Service: Endoscopy;  Laterality: N/A;  1130   . NO PAST SURGERIES      Social History   Tobacco Use  . Smoking status: Former Smoker    Quit date: 01/17/1995    Years since quitting: 25.3  . Smokeless tobacco: Former Systems developer    Quit date: 01/28/1991  Vaping Use  . Vaping Use: Never used  Substance Use Topics  . Alcohol use: No  . Drug use: No    Family History  Problem Relation Age of Onset  . Cancer Father   . Dementia Mother     Outpatient Encounter Medications as of 05/18/2020  Medication Sig  . Cholecalciferol (VITAMIN D3) 125 MCG (5000 UT) CAPS Take 1 capsule (5,000 Units total) by mouth daily.  . cinacalcet (SENSIPAR) 30 MG tablet Take 1 tablet (30 mg total) by mouth daily with breakfast.  . atorvastatin (LIPITOR) 10 MG tablet Take 1 tablet (10 mg total) by mouth daily.  . diclofenac Sodium (VOLTAREN) 1 % GEL Apply 4 g topically 4 (four) times daily.  Marland Kitchen losartan (COZAAR) 50 MG tablet Take 1 tablet (50 mg total) by mouth daily.  . polyethylene glycol powder (GLYCOLAX/MIRALAX) 17 GM/SCOOP powder Take 17 g by mouth daily as needed.  . traZODone (DESYREL) 50  MG tablet TAKE 1/2 TABLET(25 MG) BY MOUTH AT BEDTIME  . [DISCONTINUED] cholecalciferol (VITAMIN D3) 25 MCG (1000 UNIT) tablet Take 2,000 Units by mouth daily.   No facility-administered encounter medications on file as of 05/18/2020.    No Known Allergies   HPI  Scott Burton was diagnosed with hypercalcemia at least since 2018.  Patient is a poor historian, with cognitive deficit, accompanied by his daughter.  -Reportedly, he does not have previous diagnosis of parathyroid, thyroid, pituitary, adrenal dysfunctions.  He was seen with referral package showing several instances of elevated calcium to mild levels associated with high PTH.  He is also known to have CKD stage 2-3. -He is not a surgical candidate for hyperparathyroidism, was kept on observation status. His previsit labs confirm higher calcium of 11 associated with high PTH of 101. -He is not taking any calcium supplements. His bone density study is consistent with osteopenia.  No prior history of fragility fractures or falls. No history of  kidney stones.  he is not on HCTZ or other thiazide therapy.  He has vitamin D deficiency and supplement with cholecalciferol 1000 units daily.   -  he eats dairy and green, leafy, vegetables on average amounts.  he does not have a family history of hypercalcemia, pituitary tumors,  thyroid cancer, or osteoporosis.    ROS: Limited as above. PE: BP (!) 128/58   Pulse 100   Ht 5\' 6"  (1.676 m)   Wt 194 lb (88 kg)   BMI 31.31 kg/m , Body mass index is 31.31 kg/m. Wt Readings from Last 3 Encounters:  05/18/20 194 lb (88 kg)  05/12/20 192 lb 9.6 oz (87.4 kg)  03/18/20 192 lb 9.6 oz (87.4 kg)         CMP ( most recent) CMP     Component Value Date/Time   NA 140 03/18/2020 1021   K 4.4 03/18/2020 1021   CL 108 03/18/2020 1021   CO2 18 (L) 03/18/2020 1021   GLUCOSE 87 03/18/2020 1021   BUN 18 03/18/2020 1021   CREATININE 1.38 (H) 03/18/2020 1021   CALCIUM 11.0 (H)  05/12/2020 1200   PROT 7.0 03/18/2020 1021   ALBUMIN 4.0 09/07/2016 0801   AST 18 03/18/2020 1021   ALT 14 03/18/2020 1021   ALKPHOS 76 09/07/2016 0801   BILITOT 0.5 03/18/2020 1021   GFRNONAA 47 (L) 03/18/2020 1021   GFRAA 55 (L) 03/18/2020 1021     Lipid Panel ( most recent) Lipid Panel     Component Value Date/Time   CHOL 163 03/18/2020 1021   TRIG 149 03/18/2020 1021   HDL 38 (L) 03/18/2020 1021   CHOLHDL 4.3 03/18/2020 1021   VLDL 43 (H) 09/07/2016 0801   LDLCALC 100 (H) 03/18/2020 1021   LDLDIRECT 130 (H) 11/05/2018 1159      Lab Results  Component Value Date   TSH 2.510 10/29/2019   TSH 2.02 07/23/2019   TSH 1.89 12/04/2017   TSH 1.40 04/26/2015   FREET4 1.22 10/29/2019    November 13, 2019 bone density results: DualFemur Neck Left 11/05/2019 82.0 N/A -2.2 0.786 g/cm2  Left Forearm Radius 33% 11/05/2019 82.0 N/A -0.3 0.782 g/cm2 ASSESSMENT: BMD as determined from Femur Neck Left is 0.786 g/cm2 with a T-score of -2.2. This patient is considered osteopenic by World Healh Organization (WHO) Criteria. Lumbar spine was excluded due to advanced degenerative changes.    Assessment: 1. Hypercalcemia / Hyperparathyroidism 2.  Osteopenia  Plan: His presentation is consistent with mild, primary hyperparathyroidism worsening slowly over the last 3 years.  His previsit labs showing the highest calcium at 11, and PTH of 101.  His previous 24-hour urine calcium was not elevated at 45.   He is not a surgical candidate.    He has CKD and osteopenia.   He would benefit from low-dose Sensipar intervention. I discussed and prescribe Sensipar 30 mg p.o. daily at breakfast in hopes of controlling hypercalcemia towards normocalcemia. Side effects and precautions discussed with him.  I called and communicated this care plan with his granddaughter Scott Burton.  Regarding his vitamin D deficiency, he would benefit from a higher dose of vitamin D3 supplement.  I sent a prescription  for vitamin D3 5000 units daily.  He is advised to continue close follow-up with his PMD Dr. [name].  I spent 25 minutes in the care of the patient today including review of labs from Thyroid Function, CMP, and other relevant labs ; imaging/biopsy records (current and previous including abstractions from other facilities); face-to-face time discussing  his lab results and symptoms, medications doses, his options of short and long term treatment based on the latest standards of care / guidelines;   and documenting the encounter.  [name]  participated in the discussions, expressed understanding, and voiced agreement  with the above plans.  All questions were answered to his satisfaction. he is encouraged to contact clinic should he have any questions or concerns prior to his return visit.   - Return in about 14 weeks (around 08/24/2020) for F/U with Pre-visit Labs.   Glade Lloyd, MD [facility] [address] [place], [place] [address] Phone: [phone]  Fax: [phone]    This note was partially dictated with voice recognition software. Similar sounding words can be transcribed inadequately or may not  be corrected upon review.  05/18/2020, 2:35 PM

## 2020-05-22 ENCOUNTER — Other Ambulatory Visit: Payer: Self-pay | Admitting: Family Medicine

## 2020-05-26 MED ORDER — ATORVASTATIN CALCIUM 10 MG PO TABS: 10.0000 mg | ORAL_TABLET | Freq: Every day | ORAL | 0 refills | Status: DC

## 2020-07-13 ENCOUNTER — Other Ambulatory Visit (HOSPITAL_COMMUNITY): Payer: Self-pay | Admitting: Neurology

## 2020-07-13 DIAGNOSIS — Z79899 Other long term (current) drug therapy: Secondary | ICD-10-CM | POA: Diagnosis not present

## 2020-07-13 DIAGNOSIS — M109 Gout, unspecified: Secondary | ICD-10-CM | POA: Diagnosis not present

## 2020-07-13 DIAGNOSIS — F0390 Unspecified dementia without behavioral disturbance: Secondary | ICD-10-CM | POA: Diagnosis not present

## 2020-07-13 DIAGNOSIS — I10 Essential (primary) hypertension: Secondary | ICD-10-CM | POA: Diagnosis not present

## 2020-07-20 ENCOUNTER — Ambulatory Visit: Payer: Medicare HMO | Admitting: Nurse Practitioner

## 2020-07-22 ENCOUNTER — Other Ambulatory Visit: Payer: Self-pay

## 2020-07-22 ENCOUNTER — Ambulatory Visit (INDEPENDENT_AMBULATORY_CARE_PROVIDER_SITE_OTHER): Payer: Medicare HMO | Admitting: Nurse Practitioner

## 2020-07-22 ENCOUNTER — Encounter: Payer: Self-pay | Admitting: Nurse Practitioner

## 2020-07-22 VITALS — BP 124/80 | HR 85 | Temp 98.7°F | Ht 66.0 in | Wt 193.0 lb

## 2020-07-22 DIAGNOSIS — N1831 Chronic kidney disease, stage 3a: Secondary | ICD-10-CM | POA: Diagnosis not present

## 2020-07-22 DIAGNOSIS — M79641 Pain in right hand: Secondary | ICD-10-CM | POA: Diagnosis not present

## 2020-07-22 DIAGNOSIS — E782 Mixed hyperlipidemia: Secondary | ICD-10-CM

## 2020-07-22 DIAGNOSIS — E212 Other hyperparathyroidism: Secondary | ICD-10-CM | POA: Diagnosis not present

## 2020-07-22 DIAGNOSIS — I10 Essential (primary) hypertension: Secondary | ICD-10-CM | POA: Diagnosis not present

## 2020-07-22 LAB — BASIC METABOLIC PANEL WITH GFR
BUN/Creatinine Ratio: 11 (calc) (ref 6–22)
BUN: 19 mg/dL (ref 7–25)
CO2: 25 mmol/L (ref 20–32)
Calcium: 9.4 mg/dL (ref 8.6–10.3)
Chloride: 104 mmol/L (ref 98–110)
Creat: 1.8 mg/dL — ABNORMAL HIGH (ref 0.70–1.11)
GFR, Est African American: 40 mL/min/{1.73_m2} — ABNORMAL LOW (ref >=60)
GFR, Est Non African American: 34 mL/min/{1.73_m2} — ABNORMAL LOW (ref >=60)
Glucose, Bld: 95 mg/dL (ref 65–99)
Potassium: 4.8 mmol/L (ref 3.5–5.3)
Sodium: 139 mmol/L (ref 135–146)

## 2020-07-22 MED ORDER — ACETAMINOPHEN 500 MG PO TABS: 500.0000 mg | ORAL_TABLET | Freq: Four times a day (QID) | ORAL | 0 refills | Status: DC | PRN

## 2020-07-22 NOTE — Assessment & Plan Note (Signed)
Chronic.  Blood pressure at goal today in clinic.  Plan to continue losartan 50 mg daily for now-we will check kidney function today.  Follow-up in 6 months.

## 2020-07-22 NOTE — Assessment & Plan Note (Addendum)
Chronic.  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  With no benefit from Voltaren gel.  We will try extra strength Tylenol and brace.  Patient not a good candidate for chronic NSAID use because of kidneys.  Follow-up with no benefit from Tylenol.

## 2020-07-22 NOTE — Assessment & Plan Note (Signed)
Chronic.  Follows closely with endocrinology-next visit is in August.  Continue collaboration with endocrinology.

## 2020-07-22 NOTE — Progress Notes (Signed)
Subjective:    Patient ID: Scott Burton, male    DOB: 1935/07/12, 85 y.o.   MRN: [id number]  HPI: Scott Burton is a 85 y.o. male presenting with grandson for follow up.  Chief Complaint  Patient presents with   Follow-up    Follow up for chf, htn, hlp, vit d, kidney functions. Right hand sometimes cramps up   HYPERTENSION / HYPERLIPIDEMIA Currently taking losartan 50 mg daily and atorvastatin 10 mg daily.  Tolerating well without issues. Satisfied with current treatment? yes Duration of hypertension: chronic BP monitoring frequency: not checking BP medication side effects: no Duration of hyperlipidemia: chronic Cholesterol medication side effects: no Aspirin: no Recent stressors: no Recurrent headaches: no Visual changes: no Palpitations: no Dyspnea: no Chest pain: no Lower extremity edema: no Dizzy/lightheaded: no The ASCVD Risk score Scott Burton., et al., 2013) failed to calculate for the following reasons:   The 2013 ASCVD risk score is only valid for ages 28 to 25  CHRONIC KIDNEY DISEASE CKD status: stable Medications renally dose: yes Previous renal evaluation: no Pneumovax:  Not up to Date Influenza Vaccine:  Up to Date  INSOMNIA Goes to bed around 8 o clock and wakes up at 5 o clock.  Taking Trazodone 25 mg without issue. Duration: chronic Satisfied with sleep quality: yes Difficulty falling asleep: yes Difficulty staying asleep: no Waking a few hours after sleep onset: no Early morning awakenings: yes Daytime hypersomnolence: no Wakes feeling refreshed: yes Good sleep hygiene: yes  Hand pain - has tried voltaren gel; has not tried Tylenol.  Cannot make a first and hurts to move hand.   No Known Allergies  Outpatient Encounter Medications as of 07/22/2020  Medication Sig   acetaminophen (TYLENOL) 500 MG tablet Take 1 tablet (500 mg total) by mouth every 6 (six) hours as needed for moderate pain.   atorvastatin (LIPITOR) 10 MG tablet Take 1  tablet (10 mg total) by mouth daily.   Cholecalciferol (VITAMIN D3) 125 MCG (5000 UT) CAPS Take 1 capsule (5,000 Units total) by mouth daily.   cinacalcet (SENSIPAR) 30 MG tablet Take 1 tablet (30 mg total) by mouth daily with breakfast.   diclofenac Sodium (VOLTAREN) 1 % GEL Apply 4 g topically 4 (four) times daily.   losartan (COZAAR) 50 MG tablet Take 1 tablet (50 mg total) by mouth daily.   polyethylene glycol powder (GLYCOLAX/MIRALAX) 17 GM/SCOOP powder Take 17 g by mouth daily as needed.   traZODone (DESYREL) 50 MG tablet TAKE 1/2 TABLET(25 MG) BY MOUTH AT BEDTIME   No facility-administered encounter medications on file as of 07/22/2020.    Patient Active Problem List   Diagnosis Date Noted   Pain of right hand 07/22/2020   Osteoarthritis 11/25/2019   Osteopenia 11/19/2019   Hypercalcemia 10/29/2019   Gait instability 03/18/2019   Protein-calorie malnutrition ([place]) 03/18/2019   Vitamin D deficiency 11/15/2018   Other hyperparathyroidism ([place]) 11/15/2018   Depression 11/05/2018   Cognitive changes 11/05/2018   Constipation 11/05/2018   DDD (degenerative disc disease), lumbar 06/11/2018   Gout 08/10/2015   CKD (chronic kidney disease) stage 3, GFR 30-59 ml/min ([place]) 04/26/2015   Hyperlipidemia 12/25/2014   Essential hypertension, benign 09/25/2014    Past Medical History:  Diagnosis Date   Arthritis    CKD (chronic kidney disease), stage III (HCC)    GERD (gastroesophageal reflux disease)    Gout    Hyperlipidemia    Hypertension    RBBB  Relevant past medical, surgical, family and social history reviewed and updated as indicated. Interim medical history since our last visit reviewed.  Review of Systems Per HPI unless specifically indicated above     Objective:    BP 124/80   Pulse 85   Temp 98.7 F (37.1 C)   Ht 5\' 6"  (1.676 m)   Wt 193 lb (87.5 kg)   SpO2 97%   BMI 31.15 kg/m   Wt Readings from Last 3 Encounters:  07/22/20 193 lb (87.5 kg)  05/18/20  194 lb (88 kg)  05/12/20 192 lb 9.6 oz (87.4 kg)    Physical Exam Vitals and nursing note reviewed.  Constitutional:      General: He is not in acute distress.    Appearance: Normal appearance. He is not toxic-appearing.  HENT:     Head: Normocephalic and atraumatic.     Nose: Nose normal. No congestion.     Mouth/Throat:     Mouth: Mucous membranes are moist.     Pharynx: Oropharynx is clear. No oropharyngeal exudate or posterior oropharyngeal erythema.  Eyes:     General: No scleral icterus.       Right eye: No discharge.        Left eye: No discharge.     Extraocular Movements: Extraocular movements intact.  Cardiovascular:     Rate and Rhythm: Normal rate and regular rhythm.     Heart sounds: Normal heart sounds.  Pulmonary:     Effort: Pulmonary effort is normal. No respiratory distress.     Breath sounds: No wheezing, rhonchi or rales.  Abdominal:     General: Abdomen is flat. Bowel sounds are normal.     Palpations: Abdomen is soft.  Musculoskeletal:     Right hand: Swelling present. Decreased range of motion.     Cervical back: Normal range of motion.     Right lower leg: No edema.     Left lower leg: No edema.  Skin:    General: Skin is warm and dry.     Capillary Refill: Capillary refill takes less than 2 seconds.     Coloration: Skin is not jaundiced or pale.     Findings: No erythema.  Neurological:     Mental Status: He is alert and oriented to person, place, and time.     Motor: No weakness.     Gait: Gait normal.  Psychiatric:        Mood and Affect: Mood normal.        Behavior: Behavior normal.        Thought Content: Thought content normal.        Judgment: Judgment normal.       Assessment & Plan:   Problem List Items Addressed This Visit       Cardiovascular and Mediastinum   Essential hypertension, benign    Chronic.  Blood pressure at goal today in clinic.  Plan to continue losartan 50 mg daily for now-we will check kidney function today.   Follow-up in 6 months.         Endocrine   Other hyperparathyroidism (HCC)    Chronic.  Follows closely with endocrinology-next visit is in August.  Continue collaboration with endocrinology.         Genitourinary   CKD (chronic kidney disease) stage 3, GFR 30-59 ml/min (HCC)    Chronic.  Medications are renally dosed.  We will recheck kidney function today and continue losartan for kidney protection and also hypertension.  Follow-up in 6 months.       Relevant Orders   BASIC METABOLIC PANEL WITH GFR     Other   Pain of right hand    Chronic.  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  With no benefit from Voltaren gel.  We will try extra strength Tylenol and brace.  Patient not a good candidate for chronic NSAID use because of kidneys.  Follow-up with no benefit from Tylenol.       Relevant Medications   acetaminophen (TYLENOL) 500 MG tablet   Hyperlipidemia - Primary    Chronic.  Continue atorvastatin 10 mg daily-patient is tolerating this well.  Follow-up in 6 months.         Follow up plan: Return in about 6 months (around 01/22/2021) for chronic f/u.

## 2020-07-22 NOTE — Assessment & Plan Note (Signed)
Chronic.  Continue atorvastatin 10 mg daily-patient is tolerating this well.  Follow-up in 6 months.

## 2020-07-22 NOTE — Assessment & Plan Note (Signed)
Chronic.  Medications are renally dosed.  We will recheck kidney function today and continue losartan for kidney protection and also hypertension.  Follow-up in 6 months.

## 2020-07-26 NOTE — Addendum Note (Signed)
Addended by: Noemi Chapel A on: 07/26/2020 05:20 AM   Modules accepted: Orders

## 2020-07-27 NOTE — Progress Notes (Signed)
Lm for grand daughter to cb the office for lab results

## 2020-07-27 NOTE — Progress Notes (Signed)
Spoke with [name], understands pcp orders, will bring pt in in wks for bmet recheck

## 2020-07-28 ENCOUNTER — Encounter (HOSPITAL_COMMUNITY): Payer: Self-pay

## 2020-07-28 ENCOUNTER — Ambulatory Visit (HOSPITAL_COMMUNITY): Admission: RE | Admit: 2020-07-28 | Payer: Medicare HMO | Source: Ambulatory Visit

## 2020-08-10 ENCOUNTER — Other Ambulatory Visit: Payer: Self-pay

## 2020-08-10 MED ORDER — CINACALCET HCL 30 MG PO TABS: 30.0000 mg | ORAL_TABLET | Freq: Every day | ORAL | 0 refills | Status: DC

## 2020-08-11 ENCOUNTER — Other Ambulatory Visit: Payer: Medicare HMO

## 2020-08-11 ENCOUNTER — Other Ambulatory Visit: Payer: Self-pay

## 2020-08-11 DIAGNOSIS — N1831 Chronic kidney disease, stage 3a: Secondary | ICD-10-CM | POA: Diagnosis not present

## 2020-08-11 LAB — BASIC METABOLIC PANEL
BUN/Creatinine Ratio: 12 (calc) (ref 6–22)
BUN: 20 mg/dL (ref 7–25)
CO2: 23 mmol/L (ref 20–32)
Calcium: 9.3 mg/dL (ref 8.6–10.3)
Chloride: 105 mmol/L (ref 98–110)
Creat: 1.66 mg/dL — ABNORMAL HIGH (ref 0.70–1.22)
Glucose, Bld: 85 mg/dL (ref 65–99)
Potassium: 4.4 mmol/L (ref 3.5–5.3)
Sodium: 139 mmol/L (ref 135–146)

## 2020-08-20 DIAGNOSIS — E559 Vitamin D deficiency, unspecified: Secondary | ICD-10-CM | POA: Diagnosis not present

## 2020-08-20 DIAGNOSIS — E212 Other hyperparathyroidism: Secondary | ICD-10-CM | POA: Diagnosis not present

## 2020-08-21 LAB — PTH, INTACT AND CALCIUM
Calcium: 9.7 mg/dL (ref 8.6–10.2)
PTH: 113 pg/mL — ABNORMAL HIGH (ref 15–65)

## 2020-08-21 LAB — VITAMIN D 25 HYDROXY (VIT D DEFICIENCY, FRACTURES): Vit D, 25-Hydroxy: 25 ng/mL — ABNORMAL LOW (ref 30.0–100.0)

## 2020-08-24 ENCOUNTER — Ambulatory Visit (INDEPENDENT_AMBULATORY_CARE_PROVIDER_SITE_OTHER): Payer: Medicare HMO | Admitting: "Endocrinology

## 2020-08-24 ENCOUNTER — Other Ambulatory Visit: Payer: Self-pay

## 2020-08-24 ENCOUNTER — Encounter: Payer: Self-pay | Admitting: "Endocrinology

## 2020-08-24 VITALS — BP 118/68 | HR 72 | Ht 66.0 in | Wt 196.2 lb

## 2020-08-24 DIAGNOSIS — E212 Other hyperparathyroidism: Secondary | ICD-10-CM | POA: Diagnosis not present

## 2020-08-24 MED ORDER — VITAMIN D3 125 MCG (5000 UT) PO CAPS: 5000.0000 [IU] | ORAL_CAPSULE | Freq: Every day | ORAL | 1 refills | Status: DC

## 2020-08-24 MED ORDER — CINACALCET HCL 30 MG PO TABS: 30.0000 mg | ORAL_TABLET | Freq: Every day | ORAL | 1 refills | Status: DC

## 2020-08-24 NOTE — Progress Notes (Signed)
08/24/2020, 1:36 PM  Endocrinology follow-up note  Scott Burton is a 85 y.o.-year-old male, returning for follow-up after he was seen in consultation for hypercalcemia/hyperparathyroidism.  He was initially referred by his PCP  his  Scott Bear, NP .  He is not  accompanied by any family member today. Patient with cognitive deficit.  He remains on Sensipar 30 mg p.o. daily at breakfast.  His previsit labs show controlled calcium at 9.5. -His care is usually coordinated by his granddaughter Scott Burton who is helping him with his medical decisions and communicated the care plan with her.      Past Medical History:  Diagnosis Date   Arthritis    CKD (chronic kidney disease), stage III (HCC)    GERD (gastroesophageal reflux disease)    Gout    Hyperlipidemia    Hypertension    RBBB     Past Surgical History:  Procedure Laterality Date   COLONOSCOPY N/A 04/19/2016   Procedure: COLONOSCOPY;  Surgeon: Scott Binder, MD;  Location: [facility];  Service: Endoscopy;  Laterality: N/A;  1130    NO PAST SURGERIES      Social History   Tobacco Use   Smoking status: Former    Types: Cigarettes    Quit date: 01/17/1995    Years since quitting: 25.6   Smokeless tobacco: Former    Quit date: 01/28/1991  Vaping Use   Vaping Use: Never used  Substance Use Topics   Alcohol use: No   Drug use: No    Family History  Problem Relation Age of Onset   Cancer Father    Dementia Mother     Outpatient Encounter Medications as of 08/24/2020  Medication Sig   acetaminophen (TYLENOL) 500 MG tablet Take 1 tablet (500 mg total) by mouth every 6 (six) hours as needed for moderate pain.   atorvastatin (LIPITOR) 10 MG tablet Take 1 tablet (10 mg total) by mouth daily.   Cholecalciferol (VITAMIN D3) 125 MCG (5000 UT) CAPS Take 1 capsule (5,000 Units total) by mouth daily.   cinacalcet (SENSIPAR) 30 MG tablet Take 1 tablet (30 mg total) by mouth daily with breakfast.   diclofenac Sodium  (VOLTAREN) 1 % GEL Apply 4 g topically 4 (four) times daily.   losartan (COZAAR) 50 MG tablet Take 1 tablet (50 mg total) by mouth daily.   polyethylene glycol powder (GLYCOLAX/MIRALAX) 17 GM/SCOOP powder Take 17 g by mouth daily as needed.   traZODone (DESYREL) 50 MG tablet TAKE 1/2 TABLET(25 MG) BY MOUTH AT BEDTIME   [DISCONTINUED] Cholecalciferol (VITAMIN D3) 125 MCG (5000 UT) CAPS Take 1 capsule (5,000 Units total) by mouth daily.   [DISCONTINUED] cinacalcet (SENSIPAR) 30 MG tablet Take 1 tablet (30 mg total) by mouth daily with breakfast.   No facility-administered encounter medications on file as of 08/24/2020.    No Known Allergies   HPI  Scott Burton was diagnosed with hypercalcemia at least since 2018.  Patient is a poor historian, with cognitive deficit, accompanied by his daughter.  -Reportedly, he does not have previous diagnosis of parathyroid, thyroid, pituitary, adrenal dysfunctions.  -He is not a surgical candidate for hyperparathyroidism, was kept on observation status.  -He is not taking any calcium supplements. His bone density study is consistent with osteopenia.  No prior history of fragility fractures or falls. No history of  kidney stones.  he is not on HCTZ or other thiazide therapy.  He has vitamin  D deficiency and supplement with cholecalciferol 1000 units daily.   -  he eats dairy and green, leafy, vegetables on average amounts.  he does not have a family history of hypercalcemia, pituitary tumors, thyroid cancer, or osteoporosis.    ROS: Limited as above. PE: BP 118/68   Pulse 72   Ht '5\' 6"'$  (1.676 m)   Wt 196 lb 3.2 oz (89 kg)   BMI 31.67 kg/m , Body mass index is 31.67 kg/m. Wt Readings from Last 3 Encounters:  08/24/20 196 lb 3.2 oz (89 kg)  07/22/20 193 lb (87.5 kg)  05/18/20 194 lb (88 kg)         CMP ( most recent) CMP     Component Value Date/Time   NA 139 08/11/2020 0904   K 4.4 08/11/2020 0904   CL 105 08/11/2020 0904   CO2  23 08/11/2020 0904   GLUCOSE 85 08/11/2020 0904   BUN 20 08/11/2020 0904   CREATININE 1.66 (H) 08/11/2020 0904   CALCIUM 9.7 08/20/2020 0842   PROT 7.0 03/18/2020 1021   ALBUMIN 4.0 09/07/2016 0801   AST 18 03/18/2020 1021   ALT 14 03/18/2020 1021   ALKPHOS 76 09/07/2016 0801   BILITOT 0.5 03/18/2020 1021   GFRNONAA 34 (L) 07/22/2020 1144   GFRAA 40 (L) 07/22/2020 1144     Lipid Panel ( most recent) Lipid Panel     Component Value Date/Time   CHOL 163 03/18/2020 1021   TRIG 149 03/18/2020 1021   HDL 38 (L) 03/18/2020 1021   CHOLHDL 4.3 03/18/2020 1021   VLDL 43 (H) 09/07/2016 0801   LDLCALC 100 (H) 03/18/2020 1021   LDLDIRECT 130 (H) 11/05/2018 1159      Lab Results  Component Value Date   TSH 2.510 10/29/2019   TSH 2.02 07/23/2019   TSH 1.89 12/04/2017   TSH 1.40 04/26/2015   FREET4 1.22 10/29/2019    November 13, 2019 bone density results: DualFemur Neck Left 11/05/2019 82.0 N/A -2.2 0.786 g/cm2   Left Forearm Radius 33% 11/05/2019 82.0 N/A -0.3 0.782 g/cm2 ASSESSMENT: BMD as determined from Femur Neck Left is 0.786 g/cm2 with a T-score of -2.2. This patient is considered osteopenic by World Heal[employer] (WHO) Criteria. Lumbar spine was excluded due to advanced degenerative changes.    Assessment: 1. Hypercalcemia / Hyperparathyroidism 2.  Osteopenia  Plan: His presentation is consistent with mild, primary hyperparathyroidism.  He is not a surgical candidate.  He is responding to Sensipar intervention.  I discussed and continued Sensipar 30 mg p.o. daily at breakfast.      He has CKD and osteopenia.    Side effects and precautions discussed with him.    Regarding his vitamin D deficiency, he would benefit from a higher dose of vitamin D3 supplement.  I sent a prescription for vitamin D3 5000 units daily.  He is advised to continue close follow-up with his PMD Dr. Buelah Burton.  I spent 21 minutes in the care of the patient today including review of labs  from Thyroid Function, CMP, and other relevant labs ; imaging/biopsy records (current and previous including abstractions from other facilities); face-to-face time discussing  his lab results and symptoms, medications doses, his options of short and long term treatment based on the latest standards of care / guidelines;   and documenting the encounter.  [name]  participated in the discussions, expressed understanding, and voiced agreement with the above plans.  All questions were answered to his satisfaction. he  is encouraged to contact clinic should he have any questions or concerns prior to his return visit.   - Return in about 6 months (around 02/24/2021) for F/U with Pre-visit Labs.   Glade Lloyd, MD [facility] [address] [place], [place] [address] Phone: [phone]  Fax: [phone]    This note was partially dictated with voice recognition software. Similar sounding words can be transcribed inadequately or may not  be corrected upon review.  08/24/2020, 1:36 PM
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
## 2020-09-15 ENCOUNTER — Other Ambulatory Visit: Payer: Self-pay | Admitting: *Deleted

## 2020-09-15 MED ORDER — LOSARTAN POTASSIUM 100 MG PO TABS: 50.0000 mg | ORAL_TABLET | Freq: Every day | ORAL | 0 refills | Status: DC

## 2020-09-15 MED ORDER — LOSARTAN POTASSIUM 100 MG PO TABS: 100.0000 mg | ORAL_TABLET | Freq: Every day | ORAL | 0 refills | Status: DC

## 2020-09-15 NOTE — Telephone Encounter (Signed)
Received call from patient grand-daughter Sheena.   Reports that patient lost Losartan '50mg'$  and pharmacy cannot fill early.   Call placed to pharmacy and verbal authorization to refill early given. Advised to Musician for override.   Was advised that no override can be used at this time.   New prescription sent to pharmacy for losartan '100mg'$ . Advised patient to cut medication in half.

## 2020-10-01 ENCOUNTER — Ambulatory Visit: Payer: Medicare HMO

## 2020-10-11 ENCOUNTER — Other Ambulatory Visit: Payer: Self-pay | Admitting: *Deleted

## 2020-10-11 MED ORDER — LOSARTAN POTASSIUM 50 MG PO TABS: 50.0000 mg | ORAL_TABLET | Freq: Every day | ORAL | 1 refills | Status: DC

## 2020-10-11 MED ORDER — ATORVASTATIN CALCIUM 10 MG PO TABS: 10.0000 mg | ORAL_TABLET | Freq: Every day | ORAL | 1 refills | Status: DC

## 2020-10-15 ENCOUNTER — Telehealth: Payer: Self-pay

## 2020-10-15 ENCOUNTER — Other Ambulatory Visit: Payer: Self-pay

## 2020-10-15 ENCOUNTER — Ambulatory Visit (INDEPENDENT_AMBULATORY_CARE_PROVIDER_SITE_OTHER): Payer: Medicare HMO

## 2020-10-15 VITALS — Ht 66.0 in | Wt 196.0 lb

## 2020-10-15 DIAGNOSIS — Z0000 Encounter for general adult medical examination without abnormal findings: Secondary | ICD-10-CM

## 2020-10-15 NOTE — Patient Instructions (Signed)
Scott Burton , Thank you for taking time to come for your Medicare Wellness Visit. I appreciate your ongoing commitment to your health goals. Please review the following plan we discussed and let me know if I can assist you in the future.   Screening recommendations/referrals: Colonoscopy: Done 04/19/2016, no longer required due to age Recommended yearly ophthalmology/optometry visit for glaucoma screening and checkup Recommended yearly dental visit for hygiene and checkup  Vaccinations: Influenza vaccine: Due Repeat annually  Pneumococcal vaccine: Due Tdap vaccine: Due Repeat in 10 years  Shingles vaccine: Shingrix discussed. Please contact your pharmacy for coverage information.     Covid-19: Done 06/05/19 and 05/08/19  Advanced directives: Please bring a copy of your health care power of attorney and living will to the office to be added to your chart at your convenience.   Conditions/risks identified: Aim for 30 minutes of exercise or  walking each day, drink 6-8 glasses of water and eat lots of fruits and vegetables.   Next appointment: Follow up in one year for your annual wellness visit. 2023.  Preventive Care 17 Years and Older, Male  Preventive care refers to lifestyle choices and visits with your health care provider that can promote health and wellness. What does preventive care include? A yearly physical exam. This is also called an annual well check. Dental exams once or twice a year. Routine eye exams. Ask your health care provider how often you should have your eyes checked. Personal lifestyle choices, including: Daily care of your teeth and gums. Regular physical activity. Eating a healthy diet. Avoiding tobacco and drug use. Limiting alcohol use. Practicing safe sex. Taking low doses of aspirin every day. Taking vitamin and mineral supplements as recommended by your health care provider. What happens during an annual well check? The services and screenings done  by your health care provider during your annual well check will depend on your age, overall health, lifestyle risk factors, and family history of disease. Counseling  Your health care provider may ask you questions about your: Alcohol use. Tobacco use. Drug use. Emotional well-being. Home and relationship well-being. Sexual activity. Eating habits. History of falls. Memory and ability to understand (cognition). Work and work Statistician. Screening  You may have the following tests or measurements: Height, weight, and BMI. Blood pressure. Lipid and cholesterol levels. These may be checked every 5 years, or more frequently if you are over 55 years old. Skin check. Lung cancer screening. You may have this screening every year starting at age 82 if you have a 30-pack-year history of smoking and currently smoke or have quit within the past 15 years. Fecal occult blood test (FOBT) of the stool. You may have this test every year starting at age 66. Flexible sigmoidoscopy or colonoscopy. You may have a sigmoidoscopy every 5 years or a colonoscopy every 10 years starting at age 53. Prostate cancer screening. Recommendations will vary depending on your family history and other risks. Hepatitis C blood test. Hepatitis B blood test. Sexually transmitted disease (STD) testing. Diabetes screening. This is done by checking your blood sugar (glucose) after you have not eaten for a while (fasting). You may have this done every 1-3 years. Abdominal aortic aneurysm (AAA) screening. You may need this if you are a current or former smoker. Osteoporosis. You may be screened starting at age 37 if you are at high risk. Talk with your health care provider about your test results, treatment options, and if necessary, the need for more tests. Vaccines  Your health care provider may recommend certain vaccines, such as: Influenza vaccine. This is recommended every year. Tetanus, diphtheria, and acellular  pertussis (Tdap, Td) vaccine. You may need a Td booster every 10 years. Zoster vaccine. You may need this after age 7. Pneumococcal 13-valent conjugate (PCV13) vaccine. One dose is recommended after age 84. Pneumococcal polysaccharide (PPSV23) vaccine. One dose is recommended after age 65. Talk to your health care provider about which screenings and vaccines you need and how often you need them. This information is not intended to replace advice given to you by your health care provider. Make sure you discuss any questions you have with your health care provider. Document Released: 01/29/2015 Document Revised: 09/22/2015 Document Reviewed: 11/03/2014 Elsevier Interactive Patient Education  2017 [place] Prevention in the Home Falls can cause injuries. They can happen to people of all ages. There are many things you can do to make your home safe and to help prevent falls. What can I do on the outside of my home? Regularly fix the edges of walkways and driveways and fix any cracks. Remove anything that might make you trip as you walk through a door, such as a raised step or threshold. Trim any bushes or trees on the path to your home. Use bright outdoor lighting. Clear any walking paths of anything that might make someone trip, such as rocks or tools. Regularly check to see if handrails are loose or broken. Make sure that both sides of any steps have handrails. Any raised decks and porches should have guardrails on the edges. Have any leaves, snow, or ice cleared regularly. Use sand or salt on walking paths during winter. Clean up any spills in your garage right away. This includes oil or grease spills. What can I do in the bathroom? Use night lights. Install grab bars by the toilet and in the tub and shower. Do not use towel bars as grab bars. Use non-skid mats or decals in the tub or shower. If you need to sit down in the shower, use a plastic, non-slip stool. Keep the floor  dry. Clean up any water that spills on the floor as soon as it happens. Remove soap buildup in the tub or shower regularly. Attach bath mats securely with double-sided non-slip rug tape. Do not have throw rugs and other things on the floor that can make you trip. What can I do in the bedroom? Use night lights. Make sure that you have a light by your bed that is easy to reach. Do not use any sheets or blankets that are too big for your bed. They should not hang down onto the floor. Have a firm chair that has side arms. You can use this for support while you get dressed. Do not have throw rugs and other things on the floor that can make you trip. What can I do in the kitchen? Clean up any spills right away. Avoid walking on wet floors. Keep items that you use a lot in easy-to-reach places. If you need to reach something above you, use a strong step stool that has a grab bar. Keep electrical cords out of the way. Do not use floor polish or wax that makes floors slippery. If you must use wax, use non-skid floor wax. Do not have throw rugs and other things on the floor that can make you trip. What can I do with my stairs? Do not leave any items on the stairs. Make sure that there are  handrails on both sides of the stairs and use them. Fix handrails that are broken or loose. Make sure that handrails are as long as the stairways. Check any carpeting to make sure that it is firmly attached to the stairs. Fix any carpet that is loose or worn. Avoid having throw rugs at the top or bottom of the stairs. If you do have throw rugs, attach them to the floor with carpet tape. Make sure that you have a light switch at the top of the stairs and the bottom of the stairs. If you do not have them, ask someone to add them for you. What else can I do to help prevent falls? Wear shoes that: Do not have high heels. Have rubber bottoms. Are comfortable and fit you well. Are closed at the toe. Do not wear  sandals. If you use a stepladder: Make sure that it is fully opened. Do not climb a closed stepladder. Make sure that both sides of the stepladder are locked into place. Ask someone to hold it for you, if possible. Clearly mark and make sure that you can see: Any grab bars or handrails. First and last steps. Where the edge of each step is. Use tools that help you move around (mobility aids) if they are needed. These include: Canes. Walkers. Scooters. Crutches. Turn on the lights when you go into a dark area. Replace any light bulbs as soon as they burn out. Set up your furniture so you have a clear path. Avoid moving your furniture around. If any of your floors are uneven, fix them. If there are any pets around you, be aware of where they are. Review your medicines with your doctor. Some medicines can make you feel dizzy. This can increase your chance of falling. Ask your doctor what other things that you can do to help prevent falls. This information is not intended to replace advice given to you by your health care provider. Make sure you discuss any questions you have with your health care provider. Document Released: 10/29/2008 Document Revised: 06/10/2015 Document Reviewed: 02/06/2014 Elsevier Interactive Patient Education  2017 [employer].

## 2020-10-15 NOTE — Progress Notes (Signed)
Subjective:   Scott Burton is a 85 y.o. male who presents for an Initial Medicare Annual Wellness Visit. Virtual Visit via Telephone Note  I connected with  [name] on 10/15/20 at 12:00 PM EDT by telephone and verified that I am speaking with the correct person using two identifiers.  Location: Patient: HOME Provider: [facility] Persons participating in the virtual visit: patient/Nurse Health Advisor   I discussed the limitations, risks, security and privacy concerns of performing an evaluation and management service by telephone and the availability of in person appointments. The patient expressed understanding and agreed to proceed.  Interactive audio and video telecommunications were attempted between this nurse and patient, however failed, due to patient having technical difficulties OR patient did not have access to video capability.  We continued and completed visit with audio only.  Some vital signs may be absent or patient reported.   Chriss Driver, LPN  Review of Systems     Cardiac Risk Factors include: advanced age (>61men, >52 women);hypertension;sedentary lifestyle;male gender     Objective:    Today's Vitals   10/15/20 1223  Weight: 196 lb (88.9 kg)  Height: 5\' 6"  (1.676 m)   Body mass index is 31.64 kg/m.  Advanced Directives 10/15/2020 07/23/2019 04/30/2018 10/17/2017 06/11/2016 04/19/2016 08/10/2015  Does Patient Have a Medical Advance Directive? Yes No No No No No No  Type of Paramedic of [place];Living will - - - - - -  Copy of [place] in Chart? No - copy requested - - - - - -  Would patient like information on creating a medical advance directive? No - Patient declined Yes (MAU/Ambulatory/Procedural Areas - Information given) - - No - Patient declined No - Patient declined No - patient declined information    Current Medications (verified) Outpatient Encounter Medications as of 10/15/2020  Medication  Sig   acetaminophen (TYLENOL) 500 MG tablet Take 1 tablet (500 mg total) by mouth every 6 (six) hours as needed for moderate pain.   atorvastatin (LIPITOR) 20 MG tablet    Cholecalciferol (VITAMIN D3) 125 MCG (5000 UT) CAPS Take 1 capsule (5,000 Units total) by mouth daily.   cinacalcet (SENSIPAR) 30 MG tablet Take 1 tablet (30 mg total) by mouth daily with breakfast.   diclofenac Sodium (VOLTAREN) 1 % GEL Apply 4 g topically 4 (four) times daily.   losartan (COZAAR) 50 MG tablet Take 1 tablet (50 mg total) by mouth daily.   polyethylene glycol powder (GLYCOLAX/MIRALAX) 17 GM/SCOOP powder Take 17 g by mouth daily as needed.   traZODone (DESYREL) 50 MG tablet TAKE 1/2 TABLET(25 MG) BY MOUTH AT BEDTIME   [DISCONTINUED] atorvastatin (LIPITOR) 10 MG tablet Take 1 tablet (10 mg total) by mouth daily.   No facility-administered encounter medications on file as of 10/15/2020.    Allergies (verified) Patient has no known allergies.   History: Past Medical History:  Diagnosis Date   Arthritis    CKD (chronic kidney disease), stage III (HCC)    GERD (gastroesophageal reflux disease)    Gout    Hyperlipidemia    Hypertension    RBBB    Past Surgical History:  Procedure Laterality Date   COLONOSCOPY N/A 04/19/2016   Procedure: COLONOSCOPY;  Surgeon: Danie Binder, MD;  Location: [facility];  Service: Endoscopy;  Laterality: N/A;  1130    NO PAST SURGERIES     Family History  Problem Relation Age of Onset  Cancer Father    Dementia Mother    Social History   Socioeconomic History   Marital status: Widowed    Spouse name: Not on file   Number of children: 5   Years of education: Not on file   Highest education level: Not on file  Occupational History   Not on file  Tobacco Use   Smoking status: Former    Types: Cigarettes    Quit date: 01/17/1995    Years since quitting: 25.7   Smokeless tobacco: Former    Quit date: 01/28/1991  Vaping Use   Vaping Use: Never used   Substance and Sexual Activity   Alcohol use: No   Drug use: No   Sexual activity: Not Currently  Other Topics Concern   Not on file  Social History Narrative   5 children, 3 children living, 12 grandchildren, 54 great grandchildren.   Wife died in 04/28/2006.   Social Determinants of Health   Financial Resource Strain: Low Risk    Difficulty of Paying Living Expenses: Not hard at all  Food Insecurity: No Food Insecurity   Worried About Charity fundraiser in the Last Year: Never true   [place] in the Last Year: Never true  Transportation Needs: No Transportation Needs   Lack of Transportation (Medical): No   Lack of Transportation (Non-Medical): No  Physical Activity: Insufficiently Active   Days of Exercise per Week: 3 days   Minutes of Exercise per Session: 20 min  Stress: No Stress Concern Present   Feeling of Stress : Only a little  Social Connections: Moderately Integrated   Frequency of Communication with Friends and Family: More than three times a week   Frequency of Social Gatherings with Friends and Family: More than three times a week   Attends Religious Services: More than 4 times per year   Active Member of [employer] or Organizations: Yes   Attends Archivist Meetings: More than 4 times per year   Marital Status: Widowed    Tobacco Counseling Counseling given: Not Answered   Clinical Intake:  Pre-visit preparation completed: Yes  Pain : No/denies pain     Nutritional Risks: None Diabetes: No  How often do you need to have someone help you when you read instructions, pamphlets, or other written materials from your doctor or pharmacy?: 1 - Never  Diabetic?NO  Interpreter Needed?: No  Information entered by :: MJ Aseneth Hack, LPN   Activities of Daily Living In your present state of health, do you have any difficulty performing the following activities: 10/15/2020  Hearing? N  Vision? N  Difficulty concentrating or making decisions? N   Walking or climbing stairs? N  Dressing or bathing? N  Doing errands, shopping? N  Preparing Food and eating ? N  Using the Toilet? N  In the past six months, have you accidently leaked urine? N  Do you have problems with loss of bowel control? N  Managing your Medications? N  Managing your Finances? N  Housekeeping or managing your Housekeeping? N  Some recent data might be hidden    Patient Care Team: [name], NP as PCP - General (Nurse Practitioner)  Indicate any recent Medical Services you may have received from other than [facility] providers in the past year (date may be approximate).     Assessment:   This is a routine wellness examination for Ulric.  Hearing/Vision screen Hearing Screening - Comments:: No hearing issues.  Vision Screening -  Comments:: Glasses. [facility] in [place]. 10/2019.  Dietary issues and exercise activities discussed: Current Exercise Habits: Home exercise routine, Type of exercise: walking, Time (Minutes): 20, Frequency (Times/Week): 3, Weekly Exercise (Minutes/Week): 60, Intensity: Mild, Exercise limited by: cardiac condition(s)   Goals Addressed             This Visit's Progress    Have 3 meals a day         Depression Screen PHQ 2/9 Scores 10/15/2020 07/23/2019 03/18/2019 11/05/2018 12/04/2017 01/15/2017 09/14/2016  PHQ - 2 Score 2 2 0 2 0 0 0  PHQ- 9 Score 4 6 2 5  - - 1    Fall Risk Fall Risk  10/15/2020 07/22/2020 03/18/2020 07/23/2019 03/18/2019  Falls in the past year? 0 0 - 1 0  Number falls in past yr: 0 0 0 0 -  Injury with Fall? 0 - 0 0 -  Risk for fall due to : Impaired vision;Impaired balance/gait - - Impaired balance/gait;Impaired vision History of fall(s);Impaired balance/gait  Follow up - - - Falls evaluation completed Falls evaluation completed    FALL RISK PREVENTION PERTAINING TO THE HOME:  Any stairs in or around the home? No  If so, are there any without handrails? No  Home free of loose throw rugs in walkways, pet  beds, electrical cords, etc? Yes  Adequate lighting in your home to reduce risk of falls? Yes   ASSISTIVE DEVICES UTILIZED TO PREVENT FALLS:  Life alert? No  Use of a cane, walker or w/c? Yes  Grab bars in the bathroom? No  Shower chair or bench in shower? No  Elevated toilet seat or a handicapped toilet? No   TIMED UP AND GO:  Was the test performed? No . Phone visit.   Cognitive Function: MMSE - Mini Mental State Exam 03/18/2020  Orientation to time 5  Orientation to Place 5  Registration 1  Attention/ Calculation 0  Recall 2  Language- name 2 objects 2  Language- repeat 0  Language- follow 3 step command 2  Language- read & follow direction 1  Write a sentence 0  Copy design 0  Total score 18     6CIT Screen 10/15/2020  What Year? 0 points  What month? 0 points  What time? 0 points  Count back from 20 0 points  Months in reverse 4 points  Repeat phrase 8 points  Total Score 12    Immunizations Immunization History  Administered Date(s) Administered   Fluad Quad(high Dose 65+) 11/25/2019   Influenza Inj Mdck Quad Pf 10/22/2018   Influenza, High Dose Seasonal PF 01/15/2017, 12/04/2017   Influenza,inj,Quad PF,6+ Mos 09/25/2014, 09/22/2015   Moderna Sars-Covid-2 Vaccination 05/08/2019, 06/05/2019   Zoster, Live 02/04/2014    TDAP status: Due, Education has been provided regarding the importance of this vaccine. Advised may receive this vaccine at local pharmacy or Health Dept. Aware to provide a copy of the vaccination record if obtained from local pharmacy or Health Dept. Verbalized acceptance and understanding.  Flu Vaccine status: Due, Education has been provided regarding the importance of this vaccine. Advised may receive this vaccine at local pharmacy or Health Dept. Aware to provide a copy of the vaccination record if obtained from local pharmacy or Health Dept. Verbalized acceptance and understanding.  Pneumococcal vaccine status: Due, Education has been  provided regarding the importance of this vaccine. Advised may receive this vaccine at local pharmacy or Health Dept. Aware to provide a copy of the vaccination record if obtained from  local pharmacy or Health Dept. Verbalized acceptance and understanding.  Covid-19 vaccine status: Information provided on how to obtain vaccines.   Qualifies for Shingles Vaccine? Yes   Zostavax completed Yes   Shingrix Completed?: No.    Education has been provided regarding the importance of this vaccine. Patient has been advised to call insurance company to determine out of pocket expense if they have not yet received this vaccine. Advised may also receive vaccine at local pharmacy or Health Dept. Verbalized acceptance and understanding.  Screening Tests Health Maintenance  Topic Date Due   COVID-19 Vaccine (3 - Moderna risk series) 07/03/2019   INFLUENZA VACCINE  08/16/2020   Zoster Vaccines- Shingrix (1 of 2) 10/22/2020 (Originally 10/03/1954)   TETANUS/TDAP  07/22/2021 (Originally 10/03/1954)   HPV VACCINES  Aged Out    Health Maintenance  Health Maintenance Due  Topic Date Due   COVID-19 Vaccine (3 - Moderna risk series) 07/03/2019   INFLUENZA VACCINE  08/16/2020    Colorectal cancer screening: Type of screening: Colonoscopy. Completed 04/19/2016. Repeat every 10 years  Lung Cancer Screening: (Low Dose CT Chest recommended if Age 32-80 years, 30 pack-year currently smoking OR have quit w/in 15years.) does not qualify.     Additional Screening:  Hepatitis C Screening: does not qualify;   Vision Screening: Recommended annual ophthalmology exams for early detection of glaucoma and other disorders of the eye. Is the patient up to date with their annual eye exam?  Yes  Who is the provider or what is the name of the office in which the patient attends annual eye exams? [facility] If pt is not established with a provider, would they like to be referred to a provider to establish care? No .    Dental Screening: Recommended annual dental exams for proper oral hygiene  Community Resource Referral / Chronic Care Management: CRR required this visit?  No   CCM required this visit?  No      Plan:     I have personally reviewed and noted the following in the patient's chart:   Medical and social history Use of alcohol, tobacco or illicit drugs  Current medications and supplements including opioid prescriptions. Patient is not currently taking opioid prescriptions. Functional ability and status Nutritional status Physical activity Advanced directives List of other physicians Hospitalizations, surgeries, and ER visits in previous 12 months Vitals Screenings to include cognitive, depression, and falls Referrals and appointments  In addition, I have reviewed and discussed with patient certain preventive protocols, quality metrics, and best practice recommendations. A written personalized care plan for preventive services as well as general preventive health recommendations were provided to patient.     Chriss Driver, LPN   [id number]   Nurse Notes: Visit done with patient and granddaughter, [name]. Pt needs Tdap, Shingles, Pneumococcal and Flu vaccines. Discussed where pt can obtain. Colonoscopy no longer required due to age. Sheena requests rx for shower chair, bedside commode and grab bars for patient, if possible.

## 2020-10-15 NOTE — Telephone Encounter (Signed)
Pt's granddaughter, [name], asks if Rx could be written for patient for shower chair, bedside commode and grab bars for shower? Thank you!

## 2020-10-18 ENCOUNTER — Ambulatory Visit: Payer: Self-pay | Admitting: Nurse Practitioner

## 2020-10-18 ENCOUNTER — Other Ambulatory Visit: Payer: Self-pay | Admitting: Family Medicine

## 2020-10-18 NOTE — Progress Notes (Deleted)
Subjective:    Patient ID: [name], male    DOB: 15-Mar-1935, 85 y.o.   MRN: [id number]  HPI: Scott Burton is a 85 y.o. male presenting for  No chief complaint on file.  CHRONIC KIDNEY DISEASE CKD status: {Blank single:19197::"controlled","uncontrolled","better","worse","exacerbated","stable"} Medications renally dose: {Blank single:19197::"yes","no"} Previous renal evaluation: {Blank single:19197::"yes","no"} Pneumovax:  {Blank single:19197::"Up to Date","Not up to Date","unknown"} Influenza Vaccine:  {Blank single:19197::"Up to Date","Not up to Date","unknown"}  HYPERTENSION Hypertension status: {Blank single:19197::"controlled","uncontrolled","better","worse","exacerbated","stable"}  BP monitoring frequency:  {Blank single:19197::"not checking","rarely","daily","weekly","monthly","a few times a day","a few times a week","a few times a month"} BP range:  Medication compliance: Aspirin: {Blank single:19197::"yes","no"} Recurrent headaches: {Blank single:19197::"yes","no"} Visual changes: {Blank single:19197::"yes","no"} Palpitations: {Blank single:19197::"yes","no"} Dyspnea: {Blank single:19197::"yes","no"} Chest pain: {Blank single:19197::"yes","no"} Lower extremity edema: {Blank single:19197::"yes","no"} Dizzy/lightheaded: {Blank single:19197::"yes","no"}   No Known Allergies  Outpatient Encounter Medications as of 10/18/2020  Medication Sig   acetaminophen (TYLENOL) 500 MG tablet Take 1 tablet (500 mg total) by mouth every 6 (six) hours as needed for moderate pain.   atorvastatin (LIPITOR) 20 MG tablet    Cholecalciferol (VITAMIN D3) 125 MCG (5000 UT) CAPS Take 1 capsule (5,000 Units total) by mouth daily.   cinacalcet (SENSIPAR) 30 MG tablet Take 1 tablet (30 mg total) by mouth daily with breakfast.   diclofenac Sodium (VOLTAREN) 1 % GEL Apply 4 g topically 4 (four) times daily.   losartan (COZAAR) 50 MG tablet Take 1 tablet (50 mg total) by mouth daily.    polyethylene glycol powder (GLYCOLAX/MIRALAX) 17 GM/SCOOP powder Take 17 g by mouth daily as needed.   traZODone (DESYREL) 50 MG tablet TAKE 1/2 TABLET(25 MG) BY MOUTH AT BEDTIME   No facility-administered encounter medications on file as of 10/18/2020.    Patient Active Problem List   Diagnosis Date Noted   Pain of right hand 07/22/2020   Osteoarthritis 11/25/2019   Osteopenia 11/19/2019   Hypercalcemia 10/29/2019   Gait instability 03/18/2019   Protein-calorie malnutrition ([place]) 03/18/2019   Vitamin D deficiency 11/15/2018   Other hyperparathyroidism ([place]) 11/15/2018   Depression 11/05/2018   Cognitive changes 11/05/2018   Constipation 11/05/2018   DDD (degenerative disc disease), lumbar 06/11/2018   Gout 08/10/2015   CKD (chronic kidney disease) stage 3, GFR 30-59 ml/min ([place]) 04/26/2015   Hyperlipidemia 12/25/2014   Essential hypertension, benign 09/25/2014    Past Medical History:  Diagnosis Date   Arthritis    CKD (chronic kidney disease), stage III (HCC)    GERD (gastroesophageal reflux disease)    Gout    Hyperlipidemia    Hypertension    RBBB     Relevant past medical, surgical, family and social history reviewed and updated as indicated. Interim medical history since our last visit reviewed.  Review of Systems Per HPI unless specifically indicated above     Objective:    There were no vitals taken for this visit.  Wt Readings from Last 3 Encounters:  10/15/20 196 lb (88.9 kg)  08/24/20 196 lb 3.2 oz (89 kg)  07/22/20 193 lb (87.5 kg)    Physical Exam  Results for orders placed or performed in visit on 40/98/11  Basic Metabolic Panel  Result Value Ref Range   Glucose, Bld 85 65 - 99 mg/dL   BUN 20 7 - 25 mg/dL   Creat 1.66 (H) 0.70 - 1.22 mg/dL   BUN/Creatinine Ratio 12 6 - 22 (calc)   Sodium 139 135 - 146 mmol/L   Potassium 4.4 3.5 - 5.3 mmol/L  Chloride 105 98 - 110 mmol/L   CO2 23 20 - 32 mmol/L   Calcium 9.3 8.6 - 10.3 mg/dL       Assessment & Plan:   Problem List Items Addressed This Visit   None    Follow up plan: No follow-ups on file.

## 2020-10-20 NOTE — Telephone Encounter (Signed)
Yes, please order for patient.

## 2020-10-20 NOTE — Telephone Encounter (Signed)
Orders placed via [place].

## 2020-10-23 ENCOUNTER — Other Ambulatory Visit: Payer: Self-pay | Admitting: "Endocrinology

## 2020-10-23 DIAGNOSIS — R2689 Other abnormalities of gait and mobility: Secondary | ICD-10-CM | POA: Diagnosis not present

## 2020-10-23 DIAGNOSIS — M6281 Muscle weakness (generalized): Secondary | ICD-10-CM | POA: Diagnosis not present

## 2020-10-23 DIAGNOSIS — Z9181 History of falling: Secondary | ICD-10-CM | POA: Diagnosis not present

## 2020-10-23 DIAGNOSIS — R262 Difficulty in walking, not elsewhere classified: Secondary | ICD-10-CM | POA: Diagnosis not present

## 2020-11-23 DIAGNOSIS — R262 Difficulty in walking, not elsewhere classified: Secondary | ICD-10-CM | POA: Diagnosis not present

## 2020-11-23 DIAGNOSIS — M6281 Muscle weakness (generalized): Secondary | ICD-10-CM | POA: Diagnosis not present

## 2020-11-23 DIAGNOSIS — R2689 Other abnormalities of gait and mobility: Secondary | ICD-10-CM | POA: Diagnosis not present

## 2020-11-23 DIAGNOSIS — Z9181 History of falling: Secondary | ICD-10-CM | POA: Diagnosis not present

## 2021-02-16 DIAGNOSIS — R262 Difficulty in walking, not elsewhere classified: Secondary | ICD-10-CM | POA: Diagnosis not present

## 2021-02-16 DIAGNOSIS — Z9181 History of falling: Secondary | ICD-10-CM | POA: Diagnosis not present

## 2021-02-16 DIAGNOSIS — M6281 Muscle weakness (generalized): Secondary | ICD-10-CM | POA: Diagnosis not present

## 2021-02-16 DIAGNOSIS — R2689 Other abnormalities of gait and mobility: Secondary | ICD-10-CM | POA: Diagnosis not present

## 2021-02-23 DIAGNOSIS — E212 Other hyperparathyroidism: Secondary | ICD-10-CM | POA: Diagnosis not present

## 2021-02-24 ENCOUNTER — Ambulatory Visit (INDEPENDENT_AMBULATORY_CARE_PROVIDER_SITE_OTHER): Payer: Medicare HMO | Admitting: "Endocrinology

## 2021-02-24 ENCOUNTER — Encounter: Payer: Self-pay | Admitting: "Endocrinology

## 2021-02-24 VITALS — BP 126/68 | HR 84 | Ht 66.0 in | Wt 198.4 lb

## 2021-02-24 DIAGNOSIS — E559 Vitamin D deficiency, unspecified: Secondary | ICD-10-CM

## 2021-02-24 DIAGNOSIS — M8580 Other specified disorders of bone density and structure, unspecified site: Secondary | ICD-10-CM | POA: Diagnosis not present

## 2021-02-24 DIAGNOSIS — E212 Other hyperparathyroidism: Secondary | ICD-10-CM

## 2021-02-24 LAB — PTH, INTACT AND CALCIUM
Calcium: 9.3 mg/dL (ref 8.6–10.2)
PTH: 146 pg/mL — ABNORMAL HIGH (ref 15–65)

## 2021-02-24 MED ORDER — CINACALCET HCL 30 MG PO TABS: ORAL_TABLET | ORAL | 1 refills | Status: DC

## 2021-02-24 NOTE — Progress Notes (Signed)
02/24/2021, 11:48 AM  Endocrinology follow-up note  Scott Burton is a 86 y.o.-year-old male, returning for follow-up after he was seen in consultation for hypercalcemia/hyperparathyroidism.  He was initially referred by his PCP  his  [name], NP .  He is accompanied by his daughter to the clinic today.  He is currently on Sensipar 30 mg p.o. daily at breakfast.  His previsit labs show calcium controlled at 9.3.     Past Medical History:  Diagnosis Date   Arthritis    CKD (chronic kidney disease), stage III (HCC)    GERD (gastroesophageal reflux disease)    Gout    Hyperlipidemia    Hypertension    RBBB     Past Surgical History:  Procedure Laterality Date   COLONOSCOPY N/A 04/19/2016   Procedure: COLONOSCOPY;  Surgeon: Danie Binder, MD;  [facility];  Service: Endoscopy;  Laterality: N/A;  1130    NO PAST SURGERIES      Social History   Tobacco Use   Smoking status: Former    Types: Cigarettes    Quit date: 01/17/1995    Years since quitting: 26.1   Smokeless tobacco: Former    Quit date: 01/28/1991  Vaping Use   Vaping Use: Never used  Substance Use Topics   Alcohol use: No   Drug use: No    Family History  Problem Relation Age of Onset   Cancer Father    Dementia Mother     Outpatient Encounter Medications as of 02/24/2021  Medication Sig   acetaminophen (TYLENOL) 500 MG tablet Take 1 tablet (500 mg total) by mouth every 6 (six) hours as needed for moderate pain.   atorvastatin (LIPITOR) 20 MG tablet    Cholecalciferol (VITAMIN D3) 125 MCG (5000 UT) CAPS Take 1 capsule (5,000 Units total) by mouth daily.   cinacalcet (SENSIPAR) 30 MG tablet TAKE 1 TABLET EVERY DAY WITH BREAKFAST   diclofenac Sodium (VOLTAREN) 1 % GEL Apply 4 g topically 4 (four) times daily.   losartan (COZAAR) 50 MG tablet Take 1 tablet (50 mg total) by mouth daily.   polyethylene glycol powder (GLYCOLAX/MIRALAX) 17 GM/SCOOP powder Take 17 g by mouth daily as  needed.   traZODone (DESYREL) 50 MG tablet TAKE 1/2 TABLET(25 MG) BY MOUTH AT BEDTIME   [DISCONTINUED] cinacalcet (SENSIPAR) 30 MG tablet TAKE 1 TABLET EVERY DAY WITH BREAKFAST   No facility-administered encounter medications on file as of 02/24/2021.    No Known Allergies   HPI  Scott Burton was diagnosed with hypercalcemia at least since 2018.  Patient is a poor historian, with cognitive deficit, accompanied by his daughter.  -He is tolerating his Sensipar.  He has no new complaints today.  -He is not a surgical candidate for hyperparathyroidism.  -He is not taking any calcium supplements. His bone density study is consistent with osteopenia.  No prior history of fragility fractures or falls. No history of  kidney stones.  he is not on HCTZ or other thiazide therapy.  He has vitamin D deficiency and supplement with cholecalciferol 1000 units daily.   -  he eats dairy and green, leafy, vegetables on average amounts.  he does not have a family history of hypercalcemia, pituitary tumors, thyroid cancer, or osteoporosis.    ROS: Limited as above. PE: BP 126/68    Pulse 84    Ht 5\' 6"  (1.676 m)    Wt 198 lb 6.4 oz (  90 kg)    BMI 32.02 kg/m , Body mass index is 32.02 kg/m. Wt Readings from Last 3 Encounters:  02/24/21 198 lb 6.4 oz (90 kg)  10/15/20 196 lb (88.9 kg)  08/24/20 196 lb 3.2 oz (89 kg)         CMP ( most recent) CMP     Component Value Date/Time   NA 139 08/11/2020 0904   K 4.4 08/11/2020 0904   CL 105 08/11/2020 0904   CO2 23 08/11/2020 0904   GLUCOSE 85 08/11/2020 0904   BUN 20 08/11/2020 0904   CREATININE 1.66 (H) 08/11/2020 0904   CALCIUM 9.3 02/23/2021 0935   PROT 7.0 03/18/2020 1021   ALBUMIN 4.0 09/07/2016 0801   AST 18 03/18/2020 1021   ALT 14 03/18/2020 1021   ALKPHOS 76 09/07/2016 0801   BILITOT 0.5 03/18/2020 1021   GFRNONAA 34 (L) 07/22/2020 1144   GFRAA 40 (L) 07/22/2020 1144     Lipid Panel ( most recent) Lipid Panel      Component Value Date/Time   CHOL 163 03/18/2020 1021   TRIG 149 03/18/2020 1021   HDL 38 (L) 03/18/2020 1021   CHOLHDL 4.3 03/18/2020 1021   VLDL 43 (H) 09/07/2016 0801   LDLCALC 100 (H) 03/18/2020 1021   LDLDIRECT 130 (H) 11/05/2018 1159      Lab Results  Component Value Date   TSH 2.510 10/29/2019   TSH 2.02 07/23/2019   TSH 1.89 12/04/2017   TSH 1.40 04/26/2015   FREET4 1.22 10/29/2019    November 13, 2019 bone density results: DualFemur Neck Left 11/05/2019 82.0 N/A -2.2 0.786 g/cm2   Left Forearm Radius 33% 11/05/2019 82.0 N/A -0.3 0.782 g/cm2 ASSESSMENT: BMD as determined from Femur Neck Left is 0.786 g/cm2 with a T-score of -2.2. This patient is considered osteopenic by World Healh Organization (WHO) Criteria. Lumbar spine was excluded due to advanced degenerative changes.    Assessment: 1. Hypercalcemia / Hyperparathyroidism 2.  Osteopenia 3.  Vitamin D deficiency  Plan: His presentation is consistent with mild, primary hyperparathyroidism.  He has stage III-4 CKD.  He is not a surgical candidate.  He is responding to his low-dose Sensipar.  He is advised to continue Sensipar 30 mg p.o. daily at breakfast with plan to repeat calcium and visit in 6 months.     He has CKD and osteopenia.    Side effects and precautions discussed with him.    Regarding his vitamin D deficiency: He is advised to continue vitamin D supplements.   He is advised to continue close follow-up with his PMD .    I spent 21 minutes in the care of the patient today including review of labs from Thyroid Function, CMP, and other relevant labs ; imaging/biopsy records (current and previous including abstractions from other facilities); face-to-face time discussing  his lab results and symptoms, medications doses, his options of short and long term treatment based on the latest standards of care / guidelines;   and documenting the encounter.  [name]  participated in the discussions,  expressed understanding, and voiced agreement with the above plans.  All questions were answered to his satisfaction. he is encouraged to contact clinic should he have any questions or concerns prior to his return visit.   - Return in about 6 months (around 08/24/2021) for F/U with Pre-visit Labs.   Glade Lloyd, MD [facility] [address] [place], [place] [address] Phone: [phone]  Fax: [phone]    This note was partially dictated with voice recognition software. Similar sounding words can be transcribed inadequately or may not  be corrected upon review.  02/24/2021, 11:48 AM

## 2021-03-07 ENCOUNTER — Other Ambulatory Visit: Payer: Self-pay

## 2021-03-07 ENCOUNTER — Encounter: Payer: Self-pay | Admitting: Nurse Practitioner

## 2021-03-07 ENCOUNTER — Ambulatory Visit (INDEPENDENT_AMBULATORY_CARE_PROVIDER_SITE_OTHER): Payer: Medicare HMO | Admitting: Nurse Practitioner

## 2021-03-07 VITALS — BP 134/80 | HR 72 | Ht 66.0 in | Wt 197.0 lb

## 2021-03-07 DIAGNOSIS — E212 Other hyperparathyroidism: Secondary | ICD-10-CM | POA: Diagnosis not present

## 2021-03-07 DIAGNOSIS — F32A Depression, unspecified: Secondary | ICD-10-CM

## 2021-03-07 DIAGNOSIS — N1831 Chronic kidney disease, stage 3a: Secondary | ICD-10-CM

## 2021-03-07 DIAGNOSIS — G4700 Insomnia, unspecified: Secondary | ICD-10-CM | POA: Insufficient documentation

## 2021-03-07 DIAGNOSIS — F5105 Insomnia due to other mental disorder: Secondary | ICD-10-CM

## 2021-03-07 DIAGNOSIS — F419 Anxiety disorder, unspecified: Secondary | ICD-10-CM

## 2021-03-07 DIAGNOSIS — N3281 Overactive bladder: Secondary | ICD-10-CM

## 2021-03-07 DIAGNOSIS — R531 Weakness: Secondary | ICD-10-CM | POA: Insufficient documentation

## 2021-03-07 DIAGNOSIS — M159 Polyosteoarthritis, unspecified: Secondary | ICD-10-CM

## 2021-03-07 DIAGNOSIS — I10 Essential (primary) hypertension: Secondary | ICD-10-CM

## 2021-03-07 DIAGNOSIS — F99 Mental disorder, not otherwise specified: Secondary | ICD-10-CM

## 2021-03-07 DIAGNOSIS — E782 Mixed hyperlipidemia: Secondary | ICD-10-CM

## 2021-03-07 DIAGNOSIS — E559 Vitamin D deficiency, unspecified: Secondary | ICD-10-CM

## 2021-03-07 MED ORDER — MIRABEGRON ER 25 MG PO TB24: 25.0000 mg | ORAL_TABLET | Freq: Every day | ORAL | 1 refills | Status: DC

## 2021-03-07 NOTE — Assessment & Plan Note (Signed)
Vitamin D 5000 units daily,last vitamin d level was 25.0 6 months ago. Check labs at next visit in 4 months

## 2021-03-07 NOTE — Progress Notes (Signed)
Established Patient Office Visit  Subjective:  Patient ID: Scott Burton, male    DOB: May 18, 1935  Age: 86 y.o. MRN: [id number]  CC:  Chief Complaint  Patient presents with   New Patient (Initial Visit)    NP has been feeling weak about 2 weeks ago 02/21/21 has been dehydrated before    HPI Scott Burton with history of HTN, HLD, Hyperparathyroidism, insomnia, depression presents to establish care for his chronic medical conditions.  Previous PCP was Scott Chapel NP at [place]. Pt is accompanied to today's visit by his granddaughter.   HLD. Takes atorvastatin 10mg  daily, denies reaction to med Vitamin d deff. Vitamin D 5000 units daily,last vitamin d level was 25.0 6 months ago Arthritis in both hands uses voltaren gel as needed, pt states that he has hand pain every now and then. Denies numbness , tingling. HTN. Takes Losartan 50mg  daily , denies HA, dizziness,  Depression Insomnia takes trazodone 25 mg daily at bedtime for insomnia and depression. He states that he gets only 3 hours of sleep at night. Pt's granddaughter states thAT the pt lost his son 6 months and they think he is depressed from this  they denied SI, HI Hyperparathyroidism. Followed by Dr. Dorris Fetch, takes sensipra 30mg  daily. Has chronic excessive urination, tiring easily, denies nausea, vomiting.   Pt c/o chronic urinary frequency .he was previously taking myrbetriq 25mg  daily for this but it was dc due to him doing better, gets up to use the bathroom 6 times a night. Denies fever, chills, dysuria   Pt c/o weakness for about two weeks, thinks he might be dehydrated, denies fever, chills , nauseas, HA seizures.   Due for tdap, shingles vaccine, and flu vaccines, pneumonia vaccines, COVID booster, pt educated on the need for these vaccines but her refused them all.    Past Medical History:  Diagnosis Date   Arthritis    CKD (chronic kidney disease), stage III (HCC)    GERD  (gastroesophageal reflux disease)    Gout    Hyperlipidemia    Hypertension    RBBB     Past Surgical History:  Procedure Laterality Date   COLONOSCOPY N/A 04/19/2016   Procedure: COLONOSCOPY;  Surgeon: Danie Binder, MD;  [facility];  Service: Endoscopy;  Laterality: N/A;  37    NO PAST SURGERIES      Family History  Problem Relation Age of Onset   Cancer Father    Dementia Mother     Social History   Socioeconomic History   Marital status: Widowed    Spouse name: Not on file   Number of children: 5   Years of education: Not on file   Highest education level: Not on file  Occupational History   Not on file  Tobacco Use   Smoking status: Former    Types: Cigarettes    Quit date: 01/17/1995    Years since quitting: 26.1   Smokeless tobacco: Former    Quit date: 01/28/1991  Vaping Use   Vaping Use: Never used  Substance and Sexual Activity   Alcohol use: No   Drug use: No   Sexual activity: Not Currently  Other Topics Concern   Not on file  Social History Narrative   5 children, 3 children living, 12 grandchildren, 30 great grandchildren.   Wife died in 2006-04-07.   Social Determinants of Health   Financial Resource Strain: Low Risk    Difficulty of Paying  Living Expenses: Not hard at all  Food Insecurity: No Food Insecurity   Worried About Scott Burton in the Last Year: Never true   Ran Out of Food in the Last Year: Never true  Transportation Needs: No Transportation Needs   Lack of Transportation (Medical): No   Lack of Transportation (Non-Medical): No  Physical Activity: Insufficiently Active   Days of Exercise per Week: 3 days   Minutes of Exercise per Session: 20 min  Stress: No Stress Concern Present   Feeling of Stress : Only a little  Social Connections: Moderately Integrated   Frequency of Communication with Friends and Family: More than three times a week   Frequency of Social Gatherings with Friends and Family: More than three  times a week   Attends Religious Services: More than 4 times per year   Active Member of [employer] or Organizations: Yes   Attends Archivist Meetings: More than 4 times per year   Marital Status: Widowed  Human resources officer Violence: Not At Risk   Fear of Current or Ex-Partner: No   Emotionally Abused: No   Physically Abused: No   Sexually Abused: No    Outpatient Medications Prior to Visit  Medication Sig Dispense Refill   acetaminophen (TYLENOL) 500 MG tablet Take 1 tablet (500 mg total) by mouth every 6 (six) hours as needed for moderate pain. 30 tablet 0   atorvastatin (LIPITOR) 10 MG tablet Take by mouth.     Cholecalciferol (VITAMIN D3) 125 MCG (5000 UT) CAPS Take 1 capsule (5,000 Units total) by mouth daily. 90 capsule 1   cinacalcet (SENSIPAR) 30 MG tablet TAKE 1 TABLET EVERY DAY WITH BREAKFAST 90 tablet 1   diclofenac Sodium (VOLTAREN) 1 % GEL Apply 4 g topically 4 (four) times daily. 50 g 0   losartan (COZAAR) 50 MG tablet Take 1 tablet (50 mg total) by mouth daily. 90 tablet 1   polyethylene glycol powder (GLYCOLAX/MIRALAX) 17 GM/SCOOP powder Take 17 g by mouth daily as needed. 3350 g 1   traZODone (DESYREL) 50 MG tablet TAKE 1/2 TABLET(25 MG) BY MOUTH AT BEDTIME 45 tablet 3   atorvastatin (LIPITOR) 20 MG tablet  (Patient not taking: Reported on 03/07/2021)     No facility-administered medications prior to visit.    No Known Allergies  ROS Review of Systems  Constitutional:  Negative for activity change, appetite change, chills, diaphoresis, fever and unexpected weight change.  Respiratory: Negative.  Negative for apnea, cough, choking, chest tightness, shortness of breath, wheezing and stridor.   Cardiovascular: Negative.   Gastrointestinal: Negative.  Negative for abdominal distention, abdominal pain, anal bleeding and blood in stool.  Genitourinary:  Positive for frequency. Negative for difficulty urinating, dysuria, flank pain, hematuria and penile discharge.   Neurological:  Positive for weakness. Negative for dizziness, tremors, seizures, syncope, facial asymmetry, speech difficulty, light-headedness, numbness and headaches.  Psychiatric/Behavioral:  Positive for sleep disturbance.      Objective:    Physical Exam Constitutional:      General: He is not in acute distress.    Appearance: Normal appearance. He is not ill-appearing, toxic-appearing or diaphoretic.  Cardiovascular:     Rate and Rhythm: Normal rate and regular rhythm.     Pulses: Normal pulses.     Heart sounds: Normal heart sounds. No murmur heard.   No friction rub. No gallop.  Pulmonary:     Effort: Pulmonary effort is normal. No respiratory distress.     Breath sounds: Normal  breath sounds. No stridor. No wheezing, rhonchi or rales.  Chest:     Chest wall: No tenderness.  Abdominal:     Tenderness: There is no abdominal tenderness.  Neurological:     Mental Status: He is alert and oriented to person, place, and time.     Cranial Nerves: No cranial nerve deficit.     Sensory: No sensory deficit.     Motor: No weakness.     Coordination: Coordination normal.     Gait: Gait normal.     Deep Tendon Reflexes: Reflexes normal.  Psychiatric:        Mood and Affect: Mood normal.        Thought Content: Thought content normal.        Judgment: Judgment normal.    BP 134/80 (BP Location: Left Arm, Patient Position: Sitting, Cuff Size: Large)    Pulse 72    Ht 5\' 6"  (1.676 m)    Wt 197 lb 0.6 oz (89.4 kg)    SpO2 94%    BMI 31.80 kg/m  Wt Readings from Last 3 Encounters:  03/07/21 197 lb 0.6 oz (89.4 kg)  02/24/21 198 lb 6.4 oz (90 kg)  10/15/20 196 lb (88.9 kg)     Health Maintenance Due  Topic Date Due   Zoster Vaccines- Shingrix (1 of 2) Never done   Pneumonia Vaccine 35+ Years old (1 - PCV) Never done   COVID-19 Vaccine (3 - Moderna risk series) 07/03/2019   INFLUENZA VACCINE  08/16/2020    There are no preventive care reminders to display for this  patient.  Lab Results  Component Value Date   TSH 2.510 10/29/2019   Lab Results  Component Value Date   WBC 6.2 03/18/2020   HGB 13.3 03/18/2020   HCT 40.4 03/18/2020   MCV 88.2 03/18/2020   PLT 218 03/18/2020   Lab Results  Component Value Date   NA 139 08/11/2020   K 4.4 08/11/2020   CO2 23 08/11/2020   GLUCOSE 85 08/11/2020   BUN 20 08/11/2020   CREATININE 1.66 (H) 08/11/2020   BILITOT 0.5 03/18/2020   ALKPHOS 76 09/07/2016   AST 18 03/18/2020   ALT 14 03/18/2020   PROT 7.0 03/18/2020   ALBUMIN 4.0 09/07/2016   CALCIUM 9.3 02/23/2021   ANIONGAP 8 06/11/2016   Lab Results  Component Value Date   CHOL 163 03/18/2020   Lab Results  Component Value Date   HDL 38 (L) 03/18/2020   Lab Results  Component Value Date   LDLCALC 100 (H) 03/18/2020   Lab Results  Component Value Date   TRIG 149 03/18/2020   Lab Results  Component Value Date   CHOLHDL 4.3 03/18/2020   No results found for: HGBA1C    Assessment & Plan:   Problem List Items Addressed This Visit   None   No orders of the defined types were placed in this encounter.   Follow-up: No follow-ups on file.    Renee Rival, FNP

## 2021-03-07 NOTE — Patient Instructions (Addendum)
Please get your TDAP, shingles vaccine, and flu vaccines, pneumonia vaccine at your pharmacy. Please take mirabegron 25mg  daily for your overactive bladder.  Please get your fasting labs done as discussed.   It is important that you exercise regularly at least 30 minutes 5 times a week.  Think about what you will eat, plan ahead. Choose " clean, green, fresh or frozen" over canned, processed or packaged foods which are more sugary, salty and fatty. 70 to 75% of food eaten should be vegetables and fruit. Three meals at set times with snacks allowed between meals, but they must be fruit or vegetables. Aim to eat over a 12 hour period , example 7 am to 7 pm, and STOP after  your last meal of the day. Drink water,generally about 64 ounces per day, no other drink is as healthy. Fruit juice is best enjoyed in a healthy way, by EATING the fruit.  Thanks for choosing [facility], we consider it a privelige to serve you.

## 2021-03-07 NOTE — Assessment & Plan Note (Signed)
Lab Results  Component Value Date   NA 139 08/11/2020   K 4.4 08/11/2020   CO2 23 08/11/2020   GLUCOSE 85 08/11/2020   BUN 20 08/11/2020   CREATININE 1.66 (H) 08/11/2020   CALCIUM 9.3 02/23/2021   GFRNONAA 34 (L) 07/22/2020  EGFR 40, 7 months ago check CMP today, pt is on losartan 15m daily, avoid nephrotoxic agents  Drink plenety of water to stay hydrated.

## 2021-03-07 NOTE — Assessment & Plan Note (Addendum)
Not well controlled.  Takes trazodone 25 mg daily at bedtime for insomnia and depression. He states that he gets only 3 hours of sleep at night. Depression might be causing his insomnia  Pt's granddaughter states thAT the pt lost his son 6 months and they think he is depressed from this  they denied SI, HI PHQ 9 score - 8 , GAD 7 score - 14  Refer for counseling, Will adjust meds at follow up in 4 months if condition does not get better.

## 2021-03-07 NOTE — Assessment & Plan Note (Signed)
°  Not well controlled, uses Voltaren gel PRN Pt told to take tylenol 500mg  PRN 6 hours  And continue  Use of volataren gel as needed. Will refer to otho if pt continue to have uncontrolled pain at his next visit.

## 2021-03-07 NOTE — Assessment & Plan Note (Signed)
Followed by Dr. Dorris Fetch, takes sensipra 30mg  daily. Has chronic excessive urination, tiring easily, symptoms could be due to this condition. denies nausea, vomiting.  Pt encouraged to follow up closely with endo, next visit in 08/24/21

## 2021-03-07 NOTE — Assessment & Plan Note (Signed)
takes atorvastatin 10mg  daily, denies reaction to med Lipid panel [place]ady.  Does not have DM, or known CAD

## 2021-03-07 NOTE — Assessment & Plan Note (Signed)
Could be due to his depression . Takes trazodone 50mg  daily Continue med at current dose Refer for counseling, will adjust med if problem remains unchanged at next visit.

## 2021-03-07 NOTE — Assessment & Plan Note (Signed)
Weakness for 2 weeks, denies HA, fever, seizures, gait difficulty Pt is neuro intact Check CMP+EGFR,CBC.

## 2021-03-07 NOTE — Assessment & Plan Note (Addendum)
BP Readings from Last 3 Encounters:  03/07/21 134/80  02/24/21 126/68  08/24/20 118/68  well controlled  Takes Losartan 6m daily , denies HA, dizziness,  Check CMP+EGFR today

## 2021-03-07 NOTE — Assessment & Plan Note (Signed)
pt c/o chronic urinary frequency .he was previously taking myrbetriq 25mg  daily for this but it was dc due to him doing better, gets up to use the bathroom 6 times a night. Denies dysuria, fever, chills,  Will start myrbetriq 25mg  daily. Will refer to urology if condition not well controlled by myrbetric 25mg  daily

## 2021-03-08 ENCOUNTER — Other Ambulatory Visit: Payer: Self-pay | Admitting: Nurse Practitioner

## 2021-03-08 DIAGNOSIS — F419 Anxiety disorder, unspecified: Secondary | ICD-10-CM

## 2021-03-08 DIAGNOSIS — F32A Depression, unspecified: Secondary | ICD-10-CM

## 2021-03-16 ENCOUNTER — Telehealth: Payer: Self-pay | Admitting: *Deleted

## 2021-03-16 NOTE — Chronic Care Management (AMB) (Signed)
?  Chronic Care Management  ? ?Note ? ?03/16/2021 ?Name: Scott Burton MRN: [id number] DOB: 11/04/1935 ? ?Scott Burton is a 86 y.o. year old male who is a primary care patient of Renee Rival, FNP. I reached out to [name] by phone today in response to a referral sent by Scott Burton's PCP. ? ?[name] was given information about Chronic Care Management services today including:  ?CCM service includes personalized support from designated clinical staff supervised by his physician, including individualized plan of care and coordination with other care providers ?24/7 contact phone numbers for assistance for urgent and routine care needs. ?Service will only be billed when office clinical staff spend 20 minutes or more in a month to coordinate care. ?Only one practitioner may furnish and bill the service in a calendar month. ?The patient may stop CCM services at any time (effective at the end of the month) by phone call to the office staff. ?The patient is responsible for co-pay (up to 20% after annual deductible is met) if co-pay is required by the individual health plan.  ? ?[name] granddaughter DPR on file  verbally agreed to assistance and services provided by embedded care coordination/care management team today. ? ?Follow up plan: ?Telephone appointment with care management team member scheduled for:03/31/21 ? ?Laverda Sorenson  ?Care Guide, Embedded Care Coordination ?[facility]  ?Direct Dial: [phone] ? ?

## 2021-03-23 DIAGNOSIS — R262 Difficulty in walking, not elsewhere classified: Secondary | ICD-10-CM | POA: Diagnosis not present

## 2021-03-23 DIAGNOSIS — M6281 Muscle weakness (generalized): Secondary | ICD-10-CM | POA: Diagnosis not present

## 2021-03-23 DIAGNOSIS — Z9181 History of falling: Secondary | ICD-10-CM | POA: Diagnosis not present

## 2021-03-23 DIAGNOSIS — R2689 Other abnormalities of gait and mobility: Secondary | ICD-10-CM | POA: Diagnosis not present

## 2021-03-24 ENCOUNTER — Telehealth: Payer: Medicare HMO | Admitting: *Deleted

## 2021-03-24 ENCOUNTER — Telehealth: Payer: Self-pay | Admitting: *Deleted

## 2021-03-24 NOTE — Telephone Encounter (Signed)
?  Care Management  ? ?Follow Up Note ? ? ?03/24/2021 ? ?Name: Scott Burton MRN: [id number] DOB: 02/15/1935 ? ?Referred By: Renee Rival, FNP ? ?Reason for Referral:  Chronic Care Management in Patient with Depression, Anxiety, Cognitive Changes, Gait Instability, Gout, Stage III Chronic Kidney Disease, and Hypertension. ? ?An unsuccessful telephone outreach was attempted today. The patient was referred to the case management team for assistance with care management and care coordination. HIPAA compliant messages left on voicemail, including contact information, encouraging patient to return LCSW's call at his earliest convenience.  LCSW will make a second initial telephone outreach call attempt within the next 5-7 business days, if a return call is not received from patient in the meantime. ? ?Follow-Up Plan:  Telephone follow-up appointment with care management team member scheduled for:  03/31/2021 at 12:45 pm. ? ?Di Kindle Anjolina Byrer LCSW ?Licensed Clinical Social Worker ?[facility] ?[phone] ?

## 2021-03-31 ENCOUNTER — Ambulatory Visit (INDEPENDENT_AMBULATORY_CARE_PROVIDER_SITE_OTHER): Payer: Medicare HMO | Admitting: *Deleted

## 2021-03-31 DIAGNOSIS — M159 Polyosteoarthritis, unspecified: Secondary | ICD-10-CM

## 2021-03-31 DIAGNOSIS — N1831 Chronic kidney disease, stage 3a: Secondary | ICD-10-CM

## 2021-03-31 DIAGNOSIS — E782 Mixed hyperlipidemia: Secondary | ICD-10-CM

## 2021-03-31 DIAGNOSIS — M10071 Idiopathic gout, right ankle and foot: Secondary | ICD-10-CM

## 2021-03-31 DIAGNOSIS — R4189 Other symptoms and signs involving cognitive functions and awareness: Secondary | ICD-10-CM

## 2021-03-31 DIAGNOSIS — F32A Depression, unspecified: Secondary | ICD-10-CM

## 2021-04-01 NOTE — Patient Instructions (Signed)
Visit Information  ? ?Thank you for taking time to visit with me today. Please don't hesitate to contact me if I can be of assistance to you before our next scheduled telephone appointment. ? ?Please call the care guide team at [phone] if you need to cancel or reschedule your appointment.  ? ?If you are experiencing a Mental Health or [place] or need someone to talk to, please call the Suicide and Crisis Lifeline: 988 ?call the [place] National Suicide Prevention Lifeline: 856-301-9365 or TTY: 438-246-2150 TTY (978)377-9383) to talk to a trained counselor ?call 1-800-273-TALK (toll free, 24 hour hotline) ?go to [facility] [address], [place] [phone]) ?call the [facility]: [phone] ?call 911  ? ?Following is a copy of your full care plan:  ?Patient denies the need for counseling and supportive services at this time, admitting that he is currently undergoing grief counseling, which he believes "to be sufficient".  Patient was not interested in having LCSW mail him a list of community mental health providers, nor was he interested in having LCSW place a referral for him to [facility], for ongoing mental health counseling and supportive services.  Patient politely declined LCSW's offer to mail him EMMI Educational Material on Anxiety and Depression, reporting that he is already "well-versed".  LCSW provided patient with contact information, encouraging him to call LCSW directly (# [phone]), if he has questions, needs assistance, if additional social work needs arise in the near future, or if he changes his mind about wanting to receive counseling and supportive services to reduce and manage symptoms of Anxiety and Depression.  Patient voiced understanding and was agreeable to this plan.  ? ? ?Consent to CCM Services: ?Scott Burton was given information about Chronic Care Management services including:  ?CCM service  includes personalized support from designated clinical staff supervised by his physician, including individualized plan of care and coordination with other care providers ?24/7 contact phone numbers for assistance for urgent and routine care needs. ?Service will only be billed when office clinical staff spend 20 minutes or more in a month to coordinate care. ?Only one practitioner may furnish and bill the service in a calendar month. ?The patient may stop CCM services at any time (effective at the end of the month) by phone call to the office staff. ?The patient will be responsible for cost sharing (co-pay) of up to 20% of the service fee (after annual deductible is met). ? ?Patient agreed to services and verbal consent obtained.  ? ?Patient verbalizes understanding of instructions and care plan provided today and agrees to view in [place]. Active MyChart status confirmed with patient.   ? ?No Follow-Up Required. ? ?Scott Christen LCSW ?Licensed Clinical Social Worker ?[facility] ?[phone] ? ? ?  ?

## 2021-04-01 NOTE — Chronic Care Management (AMB) (Signed)
?Chronic Care Management  ? ? Clinical Social Work Note ? ?04/01/2021 ?Name: Scott Burton MRN: [id number] DOB: January 06, 1936 ? ?Scott Burton is a 86 y.o. year old male who is a primary care patient of Scott Rival, FNP. The CCM team was consulted to assist the patient with chronic disease management and/or care coordination needs related to: Mental Health Counseling and Resources.  ? ?Engaged with patient by telephone for initial visit in response to provider referral for social work chronic care management and care coordination services.  ? ?Consent to Services:  ?The patient was given information about Chronic Care Management services, agreed to services, and gave verbal consent prior to initiation of services.  Please see initial visit note for detailed documentation.  ? ?Patient agreed to services and consent obtained.  ? ?Assessment: Review of patient past medical history, allergies, medications, and health status, including review of relevant consultants reports was performed today as part of a comprehensive evaluation and provision of chronic care management and care coordination services.    ? ?SDOH (Social Determinants of Health) assessments and interventions performed:  ?SDOH Interventions   ? ?Flowsheet Row Most Recent Value  ?SDOH Interventions   ?Food Insecurity Interventions Intervention Not Indicated  ?Financial Strain Interventions Intervention Not Indicated  ?Housing Interventions Intervention Not Indicated  ?Intimate Partner Violence Interventions Intervention Not Indicated  ?Physical Activity Interventions Intervention Not Indicated, Patient Refused  ?Stress Interventions Intervention Not Indicated  ?Social Connections Interventions Intervention Not Indicated  ?Transportation Interventions Intervention Not Indicated  ?Depression Interventions/Treatment  Counseling, Patient refuses Treatment, Currently on Treatment  [Currently Receiving Grief Counseling]  ? ?  ?  ? ?Advanced Directives  Status: See Care Plan for related entries. ? ?CCM Care Plan ? ?No Known Allergies ? ?Outpatient Encounter Medications as of 03/31/2021  ?Medication Sig  ? acetaminophen (TYLENOL) 500 MG tablet Take 1 tablet (500 mg total) by mouth every 6 (six) hours as needed for moderate pain.  ? atorvastatin (LIPITOR) 10 MG tablet Take by mouth.  ? atorvastatin (LIPITOR) 20 MG tablet  (Patient not taking: Reported on 03/07/2021)  ? Cholecalciferol (VITAMIN D3) 125 MCG (5000 UT) CAPS Take 1 capsule (5,000 Units total) by mouth daily.  ? cinacalcet (SENSIPAR) 30 MG tablet TAKE 1 TABLET EVERY DAY WITH BREAKFAST  ? diclofenac Sodium (VOLTAREN) 1 % GEL Apply 4 g topically 4 (four) times daily.  ? losartan (COZAAR) 50 MG tablet Take 1 tablet (50 mg total) by mouth daily.  ? mirabegron ER (MYRBETRIQ) 25 MG TB24 tablet Take 1 tablet (25 mg total) by mouth daily.  ? polyethylene glycol powder (GLYCOLAX/MIRALAX) 17 GM/SCOOP powder Take 17 g by mouth daily as needed.  ? traZODone (DESYREL) 50 MG tablet TAKE 1/2 TABLET(25 MG) BY MOUTH AT BEDTIME  ? ?No facility-administered encounter medications on file as of 03/31/2021.  ? ? ?Patient Active Problem List  ? Diagnosis Date Noted  ? Overactive bladder 03/07/2021  ? Insomnia 03/07/2021  ? Weakness 03/07/2021  ? Pain of right hand 07/22/2020  ? Osteoarthritis 11/25/2019  ? Osteopenia 11/19/2019  ? Hypercalcemia 10/29/2019  ? Gait instability 03/18/2019  ? Protein-calorie malnutrition ([place]) 03/18/2019  ? Vitamin D deficiency 11/15/2018  ? Other hyperparathyroidism ([place]) 11/15/2018  ? Anxiety and depression 11/05/2018  ? Cognitive changes 11/05/2018  ? Constipation 11/05/2018  ? DDD (degenerative disc disease), lumbar 06/11/2018  ? Gout 08/10/2015  ? CKD (chronic kidney disease) stage 3, GFR 30-59 ml/min (HCC) 04/26/2015  ? Hyperlipidemia 12/25/2014  ? Essential hypertension,  benign 09/25/2014  ? ? ?Conditions to be addressed/monitored: Anxiety and Depression.  Mental Health Concerns and Lacks  Knowledge of [employer]. ? ?Patient denies the need for counseling and supportive services at this time, admitting that he is currently undergoing grief counseling, which he believes "to be sufficient".  Patient was not interested in having LCSW mail him a list of community mental health providers, nor was he interested in having LCSW place a referral for him to Scott Burton, for ongoing mental health counseling and supportive services.  Patient politely declined LCSW's offer to mail him EMMI Educational Material on Anxiety and Depression, reporting that he is already "well-versed".  LCSW provided patient with contact information, encouraging him to call LCSW directly (# [phone]), if he has questions, needs assistance, if additional social work needs arise in the near future, or if he changes his mind about wanting to receive counseling and supportive services to reduce and manage symptoms of Anxiety and Depression.  Patient voiced understanding and was agreeable to this plan.   ?  ?Nat Christen LCSW ?Licensed Clinical Social Worker ?[facility] ?[phone]  ? ?

## 2021-04-15 DIAGNOSIS — N1831 Chronic kidney disease, stage 3a: Secondary | ICD-10-CM

## 2021-04-15 DIAGNOSIS — F419 Anxiety disorder, unspecified: Secondary | ICD-10-CM

## 2021-04-15 DIAGNOSIS — E782 Mixed hyperlipidemia: Secondary | ICD-10-CM

## 2021-04-15 DIAGNOSIS — F32A Depression, unspecified: Secondary | ICD-10-CM

## 2021-04-15 DIAGNOSIS — M159 Polyosteoarthritis, unspecified: Secondary | ICD-10-CM

## 2021-05-16 DIAGNOSIS — M6281 Muscle weakness (generalized): Secondary | ICD-10-CM | POA: Diagnosis not present

## 2021-05-16 DIAGNOSIS — Z9181 History of falling: Secondary | ICD-10-CM | POA: Diagnosis not present

## 2021-05-16 DIAGNOSIS — R262 Difficulty in walking, not elsewhere classified: Secondary | ICD-10-CM | POA: Diagnosis not present

## 2021-05-17 ENCOUNTER — Emergency Department (HOSPITAL_COMMUNITY)
Admission: EM | Admit: 2021-05-17 | Discharge: 2021-05-17 | Discharge status: Against Medical Advice | Payer: Medicare HMO | Attending: Emergency Medicine | Admitting: Emergency Medicine

## 2021-05-17 ENCOUNTER — Emergency Department (HOSPITAL_COMMUNITY): Payer: Medicare HMO

## 2021-05-17 ENCOUNTER — Encounter (HOSPITAL_COMMUNITY): Payer: Self-pay

## 2021-05-17 ENCOUNTER — Other Ambulatory Visit: Payer: Self-pay

## 2021-05-17 DIAGNOSIS — L539 Erythematous condition, unspecified: Secondary | ICD-10-CM | POA: Diagnosis not present

## 2021-05-17 DIAGNOSIS — R509 Fever, unspecified: Secondary | ICD-10-CM | POA: Diagnosis not present

## 2021-05-17 DIAGNOSIS — R059 Cough, unspecified: Secondary | ICD-10-CM | POA: Diagnosis present

## 2021-05-17 DIAGNOSIS — U071 COVID-19: Secondary | ICD-10-CM | POA: Insufficient documentation

## 2021-05-17 LAB — URINALYSIS, ROUTINE W REFLEX MICROSCOPIC
Bilirubin Urine: NEGATIVE
Glucose, UA: NEGATIVE mg/dL
Hgb urine dipstick: NEGATIVE
Ketones, ur: NEGATIVE mg/dL
Leukocytes,Ua: NEGATIVE
Nitrite: NEGATIVE
Protein, ur: 30 mg/dL — AB
Specific Gravity, Urine: 1.021 (ref 1.005–1.030)
pH: 5 (ref 5.0–8.0)

## 2021-05-17 LAB — GROUP A STREP BY PCR: Group A Strep by PCR: NOT DETECTED

## 2021-05-17 LAB — RESP PANEL BY RT-PCR (FLU A&B, COVID) ARPGX2
Influenza A by PCR: NEGATIVE
Influenza B by PCR: NEGATIVE
SARS Coronavirus 2 by RT PCR: POSITIVE — AB

## 2021-05-17 MED ORDER — ACETAMINOPHEN 325 MG PO TABS
650.0000 mg | ORAL_TABLET | Freq: Once | ORAL | Status: AC
  Administered 2021-05-17: 650 mg via ORAL
  Filled 2021-05-17: qty 2

## 2021-05-17 NOTE — ED Triage Notes (Signed)
Pt to the ED with complaints of generalized weakness, sore scratchy throat, and a cough that began yesterday. ? ?

## 2021-05-17 NOTE — ED Provider Notes (Signed)
?[facility] ?Provider Note ? ? ?CSN: [id number] ?Arrival date & time: 05/17/21  1008 ? ?  ? ?History ? ?Chief Complaint  ?Patient presents with  ? Weakness  ? ? ?Scott Burton is a 86 y.o. male presenting with his granddaughter at the bedside with complaints of generalized fatigue a mild scratchy throat and a cough that began yesterday.  His cough has been nonproductive.  He reports generalized body aches and states he had a low-grade fever yesterday.  Of note, his temp at presentation today is 100.6.  He denies headache, chest pain, shortness of breath, has had no nausea or vomiting, he does endorse generalized body aches.  He has been able to tolerate p.o. intake. ? ?Early in the patient's visit, he received a phone call from his son who he lives with, stating he went to work and tested positive for [place] today.  This call occurred after patient had been screened for COVID, results pending.  He chose to leave AMA prior to these results and prior to me being able to discuss home treatment.  I was not made aware of him leaving until he had already left the department. ? ?The history is provided by the patient.  ? ?  ? ?Home Medications ?Prior to Admission medications   ?Medication Sig Start Date End Date Taking? Authorizing Provider  ?acetaminophen (TYLENOL) 500 MG tablet Take 1 tablet (500 mg total) by mouth every 6 (six) hours as needed for moderate pain. 07/22/20   [name], NP  ?atorvastatin (LIPITOR) 10 MG tablet Take by mouth. 12/13/20   [provider]  ?atorvastatin (LIPITOR) 20 MG tablet  08/22/20   [provider]  ?Cholecalciferol (VITAMIN D3) 125 MCG (5000 UT) CAPS Take 1 capsule (5,000 Units total) by mouth daily. 08/24/20   Cassandria Anger, MD  ?cinacalcet (SENSIPAR) 30 MG tablet TAKE 1 TABLET EVERY DAY WITH BREAKFAST 02/24/21   Cassandria Anger, MD  ?diclofenac Sodium (VOLTAREN) 1 % GEL Apply 4 g topically 4 (four) times daily. 05/12/20   [name], NP  ?losartan (COZAAR) 50 MG tablet Take 1 tablet (50 mg total) by mouth daily. 10/11/20   [name], NP  ?mirabegron ER (MYRBETRIQ) 25 MG TB24 tablet Take 1 tablet (25 mg total) by mouth daily. 03/07/21   Renee Rival, FNP  ?polyethylene glycol powder (GLYCOLAX/MIRALAX) 17 GM/SCOOP powder Take 17 g by mouth daily as needed. 07/23/19   [name], MD  ?traZODone (DESYREL) 50 MG tablet TAKE 1/2 TABLET(25 MG) BY MOUTH AT BEDTIME 03/24/20   [name], MD  ?   ? ?Allergies    ?Patient has no known allergies.   ? ?Review of Systems   ?Review of Systems  ?Constitutional:  Positive for fatigue and fever. Negative for chills.  ?HENT:  Positive for sore throat. Negative for congestion, ear pain, rhinorrhea, sinus pressure, trouble swallowing and voice change.   ?Eyes:  Negative for discharge.  ?Respiratory:  Negative for cough, shortness of breath, wheezing and stridor.   ?Cardiovascular:  Negative for chest pain.  ?Gastrointestinal:  Negative for abdominal pain.  ?Genitourinary: Negative.   ?Musculoskeletal:  Positive for myalgias.  ? ?Physical Exam ?Updated Vital Signs ?BP 123/76 (BP Location: Left Arm)   Pulse 87   Temp 98.9 ?F (37.2 ?C) (Oral)   Resp 16   Ht '5\' 6"'$  (1.676 m)   Wt 86.2 kg   SpO2 97%   BMI 30.67 kg/m?  ?  Physical Exam ?Constitutional:   ?   Appearance: He is well-developed.  ?HENT:  ?   Head: Normocephalic and atraumatic.  ?   Right Ear: Tympanic membrane and ear canal normal.  ?   Left Ear: Tympanic membrane and ear canal normal.  ?   Nose: No mucosal edema or rhinorrhea.  ?   Mouth/Throat:  ?   Mouth: Mucous membranes are moist.  ?   Pharynx: Oropharynx is clear. Uvula midline. Posterior oropharyngeal erythema present. No oropharyngeal exudate or uvula swelling.  ?   Tonsils: No tonsillar exudate or tonsillar abscesses.  ?   Comments: Mild posterior pharyngeal erythema.  There is no exudate, no tonsillar hypertrophy. ?Eyes:  ?   Conjunctiva/sclera: Conjunctivae  normal.  ?Cardiovascular:  ?   Rate and Rhythm: Normal rate.  ?   Heart sounds: Normal heart sounds.  ?Pulmonary:  ?   Effort: Pulmonary effort is normal. No respiratory distress.  ?   Breath sounds: No wheezing or rales.  ?Abdominal:  ?   Palpations: Abdomen is soft.  ?   Tenderness: There is no abdominal tenderness.  ?Musculoskeletal:     ?   General: Normal range of motion.  ?Skin: ?   General: Skin is warm and dry.  ?   Findings: No rash.  ?Neurological:  ?   Mental Status: He is alert and oriented to person, place, and time.  ? ? ?ED Results / Procedures / Treatments   ?Labs ?(all labs ordered are listed, but only abnormal results are displayed) ?Labs Reviewed  ?RESP PANEL BY RT-PCR (FLU A&B, COVID) ARPGX2 - Abnormal; Notable for the following components:  ?    Result Value  ? SARS Coronavirus 2 by RT PCR POSITIVE (*)   ? All other components within normal limits  ?URINALYSIS, ROUTINE W REFLEX MICROSCOPIC - Abnormal; Notable for the following components:  ? Protein, ur 30 (*)   ? Bacteria, UA RARE (*)   ? All other components within normal limits  ?GROUP A STREP BY PCR  ? ? ?EKG ?None ? ?Radiology ?DG Chest Portable 1 View ? ?Result Date: 05/17/2021 ?CLINICAL DATA:  Fever.  COVID exposure. EXAM: PORTABLE CHEST 1 VIEW COMPARISON:  None Available. FINDINGS: The heart size and mediastinal contours are within normal limits. Both lungs are clear. The visualized skeletal structures are unremarkable. IMPRESSION: No active disease. Electronically Signed   By: Kerby Moors M.D.   On: 05/17/2021 12:41   ? ?Procedures ?Procedures  ? ? ?Medications Ordered in ED ?Medications  ?acetaminophen (TYLENOL) tablet 650 mg (650 mg Oral Given 05/17/21 1112)  ? ? ?ED Course/ Medical Decision Making/ A&P ?  ?                        ?Medical Decision Making ?Amount and/or Complexity of Data Reviewed ?Labs: ordered. ?Radiology: ordered. ? ? ? ? ? ? ? ? ? ? ?Final Clinical Impression(s) / ED Diagnoses ?Final diagnoses:  ?COVID-19  ? ? ?Rx  / DC Orders ?ED Discharge Orders   ? ? None  ? ?  ? ? ?  ?Evalee Jefferson, PA-C ?05/17/21 1858 ? ?  ?Milton Ferguson, MD ?05/20/21 1037 ? ?

## 2021-05-18 ENCOUNTER — Telehealth: Payer: Medicare HMO | Admitting: Physician Assistant

## 2021-05-18 ENCOUNTER — Encounter: Payer: Self-pay | Admitting: Nurse Practitioner

## 2021-05-18 ENCOUNTER — Telehealth: Payer: Medicare HMO

## 2021-05-18 DIAGNOSIS — U071 COVID-19: Secondary | ICD-10-CM

## 2021-05-18 NOTE — Progress Notes (Signed)
Based on the information that you have shared in the e-Visit Questionnaire, we recommend that you convert this visit to a video visit in order for the provider to better assess what is going on.  The provider will be able to give you a more accurate diagnosis and treatment plan if we can more freely discuss your symptoms and with the addition of a virtual examination.  ? ?Giving COVID positive and risk factors for more severe illness, you are a potential candidate for antiviral medications which require a video visit. I highly recommend a video assessment so we can get you better treated.  ? ?If you convert to a video visit, we will bill your insurance (similar to an office visit) and you will not be charged for this e-Visit. You will be able to stay at home and speak with the first available [facility] advanced practice provider. The link to do a video visit is in the drop down Menu tab of your Welcome screen in [place]. ? ? ?

## 2021-05-19 ENCOUNTER — Other Ambulatory Visit: Payer: Self-pay | Admitting: Nurse Practitioner

## 2021-05-19 DIAGNOSIS — U071 COVID-19: Secondary | ICD-10-CM

## 2021-05-19 MED ORDER — BENZONATATE 100 MG PO CAPS: 100.0000 mg | ORAL_CAPSULE | Freq: Two times a day (BID) | ORAL | 0 refills | Status: DC | PRN

## 2021-05-19 NOTE — Telephone Encounter (Signed)
Please advise 

## 2021-06-01 ENCOUNTER — Other Ambulatory Visit: Payer: Self-pay | Admitting: "Endocrinology

## 2021-06-02 ENCOUNTER — Other Ambulatory Visit: Payer: Self-pay | Admitting: Nurse Practitioner

## 2021-06-06 ENCOUNTER — Telehealth: Payer: Self-pay | Admitting: Nurse Practitioner

## 2021-06-06 ENCOUNTER — Other Ambulatory Visit: Payer: Self-pay | Admitting: Nurse Practitioner

## 2021-06-06 NOTE — Telephone Encounter (Signed)
Pt granddaughter called stating he is out of the sleep medication & is wanting to know if it can please be refilled? Refills have expired according to the bottle.   Trazodone '50mg'$      [place]

## 2021-06-06 NOTE — Telephone Encounter (Signed)
Please advise 

## 2021-06-06 NOTE — Telephone Encounter (Signed)
Message has already been sent to provider mychart message was received

## 2021-06-07 ENCOUNTER — Other Ambulatory Visit: Payer: Self-pay | Admitting: Nurse Practitioner

## 2021-06-07 MED ORDER — TRAZODONE HCL 50 MG PO TABS: ORAL_TABLET | ORAL | 3 refills | Status: DC

## 2021-06-08 ENCOUNTER — Other Ambulatory Visit: Payer: Self-pay

## 2021-06-08 MED ORDER — TRAZODONE HCL 50 MG PO TABS: ORAL_TABLET | ORAL | 3 refills | Status: DC

## 2021-06-23 DIAGNOSIS — R262 Difficulty in walking, not elsewhere classified: Secondary | ICD-10-CM | POA: Diagnosis not present

## 2021-06-23 DIAGNOSIS — Z9181 History of falling: Secondary | ICD-10-CM | POA: Diagnosis not present

## 2021-06-23 DIAGNOSIS — M6281 Muscle weakness (generalized): Secondary | ICD-10-CM | POA: Diagnosis not present

## 2021-07-05 ENCOUNTER — Ambulatory Visit: Payer: Medicare HMO | Admitting: Nurse Practitioner

## 2021-07-23 DIAGNOSIS — M6281 Muscle weakness (generalized): Secondary | ICD-10-CM | POA: Diagnosis not present

## 2021-07-23 DIAGNOSIS — R262 Difficulty in walking, not elsewhere classified: Secondary | ICD-10-CM | POA: Diagnosis not present

## 2021-07-23 DIAGNOSIS — Z9181 History of falling: Secondary | ICD-10-CM | POA: Diagnosis not present

## 2021-07-26 ENCOUNTER — Other Ambulatory Visit: Payer: Self-pay | Admitting: Nurse Practitioner

## 2021-07-26 ENCOUNTER — Other Ambulatory Visit: Payer: Self-pay

## 2021-07-26 MED ORDER — LOSARTAN POTASSIUM 50 MG PO TABS: 50.0000 mg | ORAL_TABLET | Freq: Every day | ORAL | 1 refills | Status: DC

## 2021-07-28 ENCOUNTER — Ambulatory Visit (INDEPENDENT_AMBULATORY_CARE_PROVIDER_SITE_OTHER): Payer: Medicare HMO | Admitting: Nurse Practitioner

## 2021-07-28 ENCOUNTER — Encounter: Payer: Self-pay | Admitting: Nurse Practitioner

## 2021-07-28 ENCOUNTER — Telehealth: Payer: Self-pay | Admitting: Nurse Practitioner

## 2021-07-28 ENCOUNTER — Other Ambulatory Visit: Payer: Self-pay

## 2021-07-28 VITALS — BP 132/76 | HR 89 | Ht 66.0 in | Wt 198.0 lb

## 2021-07-28 DIAGNOSIS — I10 Essential (primary) hypertension: Secondary | ICD-10-CM

## 2021-07-28 DIAGNOSIS — E7849 Other hyperlipidemia: Secondary | ICD-10-CM

## 2021-07-28 DIAGNOSIS — N183 Chronic kidney disease, stage 3 unspecified: Secondary | ICD-10-CM

## 2021-07-28 DIAGNOSIS — R42 Dizziness and giddiness: Secondary | ICD-10-CM | POA: Diagnosis not present

## 2021-07-28 MED ORDER — POLYETHYLENE GLYCOL 3350 17 GM/SCOOP PO POWD
17.0000 g | Freq: Every day | ORAL | 1 refills | Status: DC | PRN
Start: 2021-07-28 — End: 2023-04-12

## 2021-07-28 MED ORDER — LOSARTAN POTASSIUM 50 MG PO TABS: 50.0000 mg | ORAL_TABLET | Freq: Every day | ORAL | 1 refills | Status: DC

## 2021-07-28 MED ORDER — VITAMIN D3 125 MCG (5000 UT) PO CAPS: 5000.0000 [IU] | ORAL_CAPSULE | Freq: Every day | ORAL | 1 refills | Status: DC

## 2021-07-28 NOTE — Patient Instructions (Addendum)
Please get your fasting labs done as dicussed      it is important that you exercise regularly at least 30 minutes 5 times a week. As tolerated  Think about what you will eat, plan ahead. Choose " clean, green, fresh or frozen" over canned, processed or packaged foods which are more sugary, salty and fatty. 70 to 75% of food eaten should be vegetables and fruit. Three meals at set times with snacks allowed between meals, but they must be fruit or vegetables. Aim to eat over a 12 hour period , example 7 am to 7 pm, and STOP after  your last meal of the day. Drink water,generally about 64 ounces per day, no other drink is as healthy. Fruit juice is best enjoyed in a healthy way, by EATING the fruit.  Thanks for choosing [facility], we consider it a privelige to serve you.

## 2021-07-28 NOTE — Assessment & Plan Note (Signed)
BP Readings from Last 3 Encounters:  07/28/21 132/76  05/17/21 123/76  03/07/21 134/80  Chronic condition well-controlled on losartan 50 mg daily Continue current medication DASH diet advised engage in regular walking exercises daily as tolerated CMP today

## 2021-07-28 NOTE — Telephone Encounter (Signed)
Patient needs refill on   Cholecalciferol (VITAMIN D3) 125 MCG (5000 UT) CAPS   atorvastatin (LIPITOR) 10 MG tablet   losartan (COZAAR) 50 MG tablet    [facility]

## 2021-07-28 NOTE — Assessment & Plan Note (Signed)
Chronic condition followed by neurology Patient told to make use of rails on the stairs when climbing down the stairs to help prevent falls Patient advised to get up slowly when standing up from sitting position to help prevent falls Check CBC

## 2021-07-28 NOTE — Progress Notes (Signed)
   Scott Burton     MRN: [id number]      DOB: 20-Oct-1935   HPI Scott Burton with past medical history of hypertension, CKD stage III, weakness hyperlipidemia is here for follow up and re-evaluation of chronic medical conditions, medication management    Patient denies adverse reactions to current medications,  Patient stated that he sometimes feels dizzy when walking down the stairs, he denies chest pain, shortness of breath, syncope.  His symptom has been going on for over a year.        ROS Denies recent fever or chills. Denies sinus pressure, nasal congestion, ear pain or sore throat. Denies chest congestion, productive cough or wheezing. Denies chest pains, palpitations and leg swelling Denies abdominal pain, nausea, vomiting,diarrhea or constipation.   Denies dysuria, frequency, hesitancy or incontinence. Denies joint pain, swelling and limitation in mobility. Denies headaches, seizures, numbness, or tingling. Denies depression, anxiety or insomnia. Denies skin break down or rash.   PE  BP 132/76 (BP Location: Right Arm, Patient Position: Sitting, Cuff Size: Large)   Pulse 89   Ht '5\' 6"'$  (1.676 m)   Wt 198 lb (89.8 kg)   SpO2 96%   BMI 31.96 kg/m   Patient alert and oriented and in no cardiopulmonary distress.  Chest: Clear to auscultation bilaterally.  CVS: S1, S2 no murmurs, no S3.Regular rate.  ABD: Soft non tender.   Ext: No edema  MS: Adequate ROM spine, shoulders, hips and knees.  Skin: Intact, no ulcerations or rash noted.  Psych: Good eye contact, normal affect. Memory intact not anxious or depressed appearing.  CNS: CN 2-12 intact, power,  normal throughout.no focal deficits noted.   Assessment & Plan  Essential hypertension, benign BP Readings from Last 3 Encounters:  07/28/21 132/76  05/17/21 123/76  03/07/21 134/80  Chronic condition well-controlled on losartan 50 mg daily Continue current medication DASH diet advised engage in  regular walking exercises daily as tolerated CMP today  Hyperlipidemia Currently on atorvastatin 10 mg daily Check fasting lipid panel  Dizziness Chronic condition followed by neurology Patient told to make use of rails on the stairs when climbing down the stairs to help prevent falls Patient advised to get up slowly when standing up from sitting position to help prevent falls Check CBC  CKD (chronic kidney disease) stage 3, GFR 30-59 ml/min Avoid NSAIDs and other nephrotoxic agents Drink at least 64 ounces of water daily Currently on losartan 50 mg daily  Lab Results  Component Value Date   NA 139 08/11/2020   K 4.4 08/11/2020   CO2 23 08/11/2020   GLUCOSE 85 08/11/2020   BUN 20 08/11/2020   CREATININE 1.66 (H) 08/11/2020   CALCIUM 9.3 02/23/2021   GFRNONAA 34 (L) 07/22/2020

## 2021-07-28 NOTE — Telephone Encounter (Signed)
Refills sent

## 2021-07-28 NOTE — Assessment & Plan Note (Addendum)
Avoid NSAIDs and other nephrotoxic agents Drink at least 64 ounces of water daily Currently on losartan 50 mg daily  Lab Results  Component Value Date   NA 139 08/11/2020   K 4.4 08/11/2020   CO2 23 08/11/2020   GLUCOSE 85 08/11/2020   BUN 20 08/11/2020   CREATININE 1.66 (H) 08/11/2020   CALCIUM 9.3 02/23/2021   GFRNONAA 34 (L) 07/22/2020

## 2021-07-28 NOTE — Assessment & Plan Note (Signed)
Currently on atorvastatin 10 mg daily Check fasting lipid panel

## 2021-08-03 ENCOUNTER — Other Ambulatory Visit: Payer: Self-pay | Admitting: Nurse Practitioner

## 2021-08-03 MED ORDER — ATORVASTATIN CALCIUM 10 MG PO TABS: 10.0000 mg | ORAL_TABLET | Freq: Every day | ORAL | 1 refills | Status: DC

## 2021-08-05 ENCOUNTER — Ambulatory Visit: Payer: Medicare HMO | Admitting: Nurse Practitioner

## 2021-08-06 ENCOUNTER — Encounter: Payer: Self-pay | Admitting: Nurse Practitioner

## 2021-08-08 ENCOUNTER — Other Ambulatory Visit: Payer: Self-pay

## 2021-08-08 MED ORDER — LOSARTAN POTASSIUM 50 MG PO TABS: 50.0000 mg | ORAL_TABLET | Freq: Every day | ORAL | 1 refills | Status: DC

## 2021-08-10 NOTE — Telephone Encounter (Signed)
Spoke with granddaughter

## 2021-08-23 DIAGNOSIS — Z9181 History of falling: Secondary | ICD-10-CM | POA: Diagnosis not present

## 2021-08-23 DIAGNOSIS — R262 Difficulty in walking, not elsewhere classified: Secondary | ICD-10-CM | POA: Diagnosis not present

## 2021-08-23 DIAGNOSIS — M6281 Muscle weakness (generalized): Secondary | ICD-10-CM | POA: Diagnosis not present

## 2021-08-23 DIAGNOSIS — R2689 Other abnormalities of gait and mobility: Secondary | ICD-10-CM | POA: Diagnosis not present

## 2021-08-24 ENCOUNTER — Ambulatory Visit: Payer: Medicare HMO | Admitting: "Endocrinology

## 2021-08-26 ENCOUNTER — Other Ambulatory Visit: Payer: Self-pay | Admitting: "Endocrinology

## 2021-08-26 DIAGNOSIS — E212 Other hyperparathyroidism: Secondary | ICD-10-CM

## 2021-08-26 DIAGNOSIS — M8580 Other specified disorders of bone density and structure, unspecified site: Secondary | ICD-10-CM

## 2021-08-26 DIAGNOSIS — N1831 Chronic kidney disease, stage 3a: Secondary | ICD-10-CM | POA: Diagnosis not present

## 2021-08-26 DIAGNOSIS — I10 Essential (primary) hypertension: Secondary | ICD-10-CM | POA: Diagnosis not present

## 2021-08-26 DIAGNOSIS — E782 Mixed hyperlipidemia: Secondary | ICD-10-CM | POA: Diagnosis not present

## 2021-08-27 LAB — CBC WITH DIFFERENTIAL/PLATELET
Basophils Absolute: 0 10*3/uL (ref 0.0–0.2)
Basos: 1 %
EOS (ABSOLUTE): 0.1 10*3/uL (ref 0.0–0.4)
Eos: 2 %
Hematocrit: 41.1 % (ref 37.5–51.0)
Hemoglobin: 13.5 g/dL (ref 13.0–17.7)
Immature Grans (Abs): 0 10*3/uL (ref 0.0–0.1)
Immature Granulocytes: 0 %
Lymphocytes Absolute: 2 10*3/uL (ref 0.7–3.1)
Lymphs: 39 %
MCH: 29 pg (ref 26.6–33.0)
MCHC: 32.8 g/dL (ref 31.5–35.7)
MCV: 88 fL (ref 79–97)
Monocytes Absolute: 0.2 10*3/uL (ref 0.1–0.9)
Monocytes: 5 %
Neutrophils Absolute: 2.8 10*3/uL (ref 1.4–7.0)
Neutrophils: 53 %
Platelets: 181 10*3/uL (ref 150–450)
RBC: 4.66 x10E6/uL (ref 4.14–5.80)
RDW: 12.7 % (ref 11.6–15.4)
WBC: 5.1 10*3/uL (ref 3.4–10.8)

## 2021-08-27 LAB — LIPID PANEL
Chol/HDL Ratio: 4.4 ratio (ref 0.0–5.0)
Cholesterol, Total: 162 mg/dL (ref 100–199)
HDL: 37 mg/dL — ABNORMAL LOW (ref >39)
LDL Chol Calc (NIH): 100 mg/dL — ABNORMAL HIGH (ref 0–99)
Triglycerides: 141 mg/dL (ref 0–149)
VLDL Cholesterol Cal: 25 mg/dL (ref 5–40)

## 2021-08-27 LAB — CMP14+EGFR
ALT: 12 IU/L (ref 0–44)
AST: 17 IU/L (ref 0–40)
Albumin/Globulin Ratio: 1.7 (ref 1.2–2.2)
Albumin: 4.3 g/dL (ref 3.7–4.7)
Alkaline Phosphatase: 79 IU/L (ref 44–121)
BUN/Creatinine Ratio: 10 (ref 10–24)
BUN: 15 mg/dL (ref 8–27)
Bilirubin Total: 0.7 mg/dL (ref 0.0–1.2)
CO2: 21 mmol/L (ref 20–29)
Calcium: 9.5 mg/dL (ref 8.6–10.2)
Chloride: 102 mmol/L (ref 96–106)
Creatinine, Ser: 1.52 mg/dL — ABNORMAL HIGH (ref 0.76–1.27)
Globulin, Total: 2.6 g/dL (ref 1.5–4.5)
Glucose: 85 mg/dL (ref 70–99)
Potassium: 4.5 mmol/L (ref 3.5–5.2)
Sodium: 142 mmol/L (ref 134–144)
Total Protein: 6.9 g/dL (ref 6.0–8.5)
eGFR: 45 mL/min/{1.73_m2} — ABNORMAL LOW (ref >59)

## 2021-08-27 LAB — PTH, INTACT AND CALCIUM
Calcium: 9.6 mg/dL (ref 8.6–10.2)
PTH: 108 pg/mL — ABNORMAL HIGH (ref 15–65)

## 2021-08-27 LAB — PHOSPHORUS: Phosphorus: 3.1 mg/dL (ref 2.8–4.1)

## 2021-08-27 LAB — MAGNESIUM: Magnesium: 1.8 mg/dL (ref 1.6–2.3)

## 2021-08-27 NOTE — Progress Notes (Signed)
Will review labs at upcoming appointment.

## 2021-08-30 ENCOUNTER — Emergency Department (HOSPITAL_COMMUNITY)
Admission: EM | Admit: 2021-08-30 | Discharge: 2021-08-30 | Disposition: A | Discharge status: Home / Self Care | Payer: Medicare HMO | Attending: Emergency Medicine | Admitting: Emergency Medicine

## 2021-08-30 ENCOUNTER — Encounter (HOSPITAL_COMMUNITY): Payer: Self-pay

## 2021-08-30 ENCOUNTER — Emergency Department (HOSPITAL_COMMUNITY): Payer: Medicare HMO

## 2021-08-30 ENCOUNTER — Other Ambulatory Visit: Payer: Self-pay

## 2021-08-30 ENCOUNTER — Ambulatory Visit: Payer: Medicare HMO | Admitting: Nurse Practitioner

## 2021-08-30 DIAGNOSIS — M47896 Other spondylosis, lumbar region: Secondary | ICD-10-CM | POA: Diagnosis not present

## 2021-08-30 DIAGNOSIS — M549 Dorsalgia, unspecified: Secondary | ICD-10-CM

## 2021-08-30 DIAGNOSIS — M545 Low back pain, unspecified: Secondary | ICD-10-CM | POA: Diagnosis not present

## 2021-08-30 DIAGNOSIS — M479 Spondylosis, unspecified: Secondary | ICD-10-CM

## 2021-08-30 DIAGNOSIS — M546 Pain in thoracic spine: Secondary | ICD-10-CM | POA: Diagnosis not present

## 2021-08-30 LAB — URINALYSIS, ROUTINE W REFLEX MICROSCOPIC
Bilirubin Urine: NEGATIVE
Glucose, UA: NEGATIVE mg/dL
Hgb urine dipstick: NEGATIVE
Ketones, ur: NEGATIVE mg/dL
Nitrite: NEGATIVE
Protein, ur: 30 mg/dL — AB
Specific Gravity, Urine: 1.02 (ref 1.005–1.030)
pH: 5 (ref 5.0–8.0)

## 2021-08-30 NOTE — ED Provider Notes (Signed)
[facility] EMERGENCY DEPARTMENT Provider Note   CSN: [id number] Arrival date & time: 08/30/21  1126     History {Add pertinent medical, surgical, social history, OB history to HPI:1} Chief Complaint  Patient presents with   Back Pain    Scott Burton is a 86 y.o. male.  HPI   He presents for evaluation of lower back pain for several days, without known trauma.  The pain is worse when he is ambulating.  His family members thought he had a kidney problem so decided to bring him here today.  He has not tried anything for the pain yet.  No prior similar problems and there is been no falling.  Home Medications Prior to Admission medications   Medication Sig Start Date End Date Taking? Authorizing Provider  acetaminophen (TYLENOL) 500 MG tablet Take 1 tablet (500 mg total) by mouth every 6 (six) hours as needed for moderate pain. Patient not taking: Reported on 07/28/2021 07/22/20   [name], NP  atorvastatin (LIPITOR) 10 MG tablet Take 1 tablet (10 mg total) by mouth daily. 08/03/21   Renee Rival, FNP  Cholecalciferol (VITAMIN D3) 125 MCG (5000 UT) CAPS Take 1 capsule (5,000 Units total) by mouth daily. 07/28/21   Renee Rival, FNP  cinacalcet (SENSIPAR) 30 MG tablet TAKE 1 TABLET EVERY DAY WITH BREAKFAST 06/01/21   Cassandria Anger, MD  diclofenac Sodium (VOLTAREN) 1 % GEL Apply 4 g topically 4 (four) times daily. 05/12/20   [name], NP  losartan (COZAAR) 50 MG tablet Take 1 tablet (50 mg total) by mouth daily. 08/08/21   Paseda, Dewaine Conger, FNP  mirabegron ER (MYRBETRIQ) 25 MG TB24 tablet Take 1 tablet (25 mg total) by mouth daily. 03/07/21   Paseda, Dewaine Conger, FNP  polyethylene glycol powder (GLYCOLAX/MIRALAX) 17 GM/SCOOP powder Take 17 g by mouth daily as needed. 07/28/21   Renee Rival, FNP  traZODone (DESYREL) 50 MG tablet TAKE 1/2 TABLET(25 MG) BY MOUTH AT BEDTIME 06/08/21   Renee Rival, FNP      Allergies    Patient has no  known allergies.    Review of Systems   Review of Systems  Physical Exam Updated Vital Signs BP (!) 153/70   Pulse 85   Temp 98 F (36.7 C) (Oral)   Resp 16   Ht '5\' 6"'$  (1.676 m)   Wt 88.6 kg   SpO2 100%   BMI 31.52 kg/m  Physical Exam Vitals and nursing note reviewed.  Constitutional:      Appearance: He is well-developed. He is not ill-appearing.     Comments: Slender, elderly  HENT:     Head: Normocephalic and atraumatic.     Right Ear: External ear normal.     Left Ear: External ear normal.  Eyes:     Conjunctiva/sclera: Conjunctivae normal.     Pupils: Pupils are equal, round, and reactive to light.  Neck:     Trachea: Phonation normal.  Cardiovascular:     Rate and Rhythm: Normal rate and regular rhythm.     Heart sounds: Normal heart sounds.  Pulmonary:     Effort: Pulmonary effort is normal.     Breath sounds: Normal breath sounds.  Abdominal:     General: There is no distension.     Palpations: Abdomen is soft.     Tenderness: There is no abdominal tenderness.  Musculoskeletal:        General: Normal range of motion.  Cervical back: Normal range of motion and neck supple.     Comments: Back is nontender to palpation.  No palpable deformities of the thoracic or lumbar spines.  He is able to stand and independently stand on each leg, for a couple of seconds.  No deformity of the large joints.  Skin:    General: Skin is warm and dry.  Neurological:     Mental Status: He is alert and oriented to person, place, and time.     Cranial Nerves: No cranial nerve deficit.     Sensory: No sensory deficit.     Motor: No abnormal muscle tone.     Coordination: Coordination normal.     Comments: Left quadricep weakness, difficulty extending left lower leg or lifting leg off stretcher.  He is able to push down on toes bilaterally with equal strength.  Psychiatric:        Mood and Affect: Mood normal.        Behavior: Behavior normal.        Thought Content: Thought  content normal.        Judgment: Judgment normal.     ED Results / Procedures / Treatments   Labs (all labs ordered are listed, but only abnormal results are displayed) Labs Reviewed - No data to display  EKG None  Radiology No results found.  Procedures Procedures  {Document cardiac monitor, telemetry assessment procedure when appropriate:1}  Medications Ordered in ED Medications - No data to display  ED Course/ Medical Decision Making/ A&P                           Medical Decision Making Amount and/or Complexity of Data Reviewed Independent Historian:     Details: He is a cogent historian External Data Reviewed: radiology.   ***  {Document critical care time when appropriate:1} {Document review of labs and clinical decision tools ie heart score, Chads2Vasc2 etc:1}  {Document your independent review of radiology images, and any outside records:1} {Document your discussion with family members, caretakers, and with consultants:1} {Document social determinants of health affecting pt's care:1} {Document your decision making why or why not admission, treatments were needed:1} Final Clinical Impression(s) / ED Diagnoses Final diagnoses:  None    Rx / DC Orders ED Discharge Orders     None

## 2021-08-30 NOTE — ED Notes (Signed)
Pt able to walk to exam room. Not complaining of any pain at this time. States he has intermittent bilateral lower back pain occasionally while changing positions from sitting to standing. Does not radiate.

## 2021-08-30 NOTE — Discharge Instructions (Signed)
The x-rays show that you have arthritis in your back which is likely causing the pain.  To help that take Tylenol every 4 hours, 650 mg.  He can also use heat on the sore area 3-4 times a day.  See your doctor for problems.

## 2021-08-30 NOTE — ED Triage Notes (Signed)
Patient complaining of lower back pain for the past few weeks and constipation with last BM yesterday.

## 2021-09-08 ENCOUNTER — Ambulatory Visit (INDEPENDENT_AMBULATORY_CARE_PROVIDER_SITE_OTHER): Payer: Medicare HMO | Admitting: "Endocrinology

## 2021-09-08 ENCOUNTER — Encounter: Payer: Self-pay | Admitting: "Endocrinology

## 2021-09-08 VITALS — BP 112/70 | HR 88 | Ht 66.0 in | Wt 197.2 lb

## 2021-09-08 DIAGNOSIS — E212 Other hyperparathyroidism: Secondary | ICD-10-CM | POA: Diagnosis not present

## 2021-09-08 MED ORDER — CINACALCET HCL 30 MG PO TABS: 30.0000 mg | ORAL_TABLET | ORAL | 1 refills | Status: DC

## 2021-09-08 NOTE — Progress Notes (Signed)
09/08/2021, 1:35 PM  Endocrinology follow-up note  Scott Burton is a 86 y.o.-year-old male, returning for follow-up after he was seen in consultation for hypercalcemia/hyperparathyroidism.  He was initially referred by his PCP  his  Renee Rival, FNP .  He is accompanied by his daughter to the clinic today.  He is currently on Sensipar 30 mg p.o. daily at breakfast.  His previsit labs show calcium controlled at 9.5, low with controlled PTH at 108.     Past Medical History:  Diagnosis Date   Arthritis    CKD (chronic kidney disease), stage III (HCC)    GERD (gastroesophageal reflux disease)    Gout    Hyperlipidemia    Hypertension    RBBB     Past Surgical History:  Procedure Laterality Date   COLONOSCOPY N/A 04/19/2016   Procedure: COLONOSCOPY;  Surgeon: Danie Binder, MD;  Location: [facility];  Service: Endoscopy;  Laterality: N/A;  1130    NO PAST SURGERIES      Social History   Tobacco Use   Smoking status: Former    Types: Cigarettes    Quit date: 01/17/1995    Years since quitting: 26.6    Passive exposure: Past   Smokeless tobacco: Former    Quit date: 01/28/1991  Vaping Use   Vaping Use: Never used  Substance Use Topics   Alcohol use: No   Drug use: No    Family History  Problem Relation Age of Onset   Cancer Father    Dementia Mother     Outpatient Encounter Medications as of 09/08/2021  Medication Sig   acetaminophen (TYLENOL) 500 MG tablet Take 1 tablet (500 mg total) by mouth every 6 (six) hours as needed for moderate pain. (Patient not taking: Reported on 08/30/2021)   atorvastatin (LIPITOR) 10 MG tablet Take 1 tablet (10 mg total) by mouth daily.   Cholecalciferol (VITAMIN D3) 125 MCG (5000 UT) CAPS Take 1 capsule (5,000 Units total) by mouth daily.   cinacalcet (SENSIPAR) 30 MG tablet Take 1 tablet (30 mg total) by mouth every other day.   diclofenac Sodium (VOLTAREN) 1 % GEL Apply 4 g topically 4 (four) times daily.   losartan  (COZAAR) 50 MG tablet Take 1 tablet (50 mg total) by mouth daily.   mirabegron ER (MYRBETRIQ) 25 MG TB24 tablet Take 1 tablet (25 mg total) by mouth daily.   polyethylene glycol powder (GLYCOLAX/MIRALAX) 17 GM/SCOOP powder Take 17 g by mouth daily as needed.   traZODone (DESYREL) 50 MG tablet TAKE 1/2 TABLET(25 MG) BY MOUTH AT BEDTIME   [DISCONTINUED] cinacalcet (SENSIPAR) 30 MG tablet TAKE 1 TABLET EVERY DAY WITH BREAKFAST   No facility-administered encounter medications on file as of 09/08/2021.    No Known Allergies   HPI  Scott Burton was diagnosed with hypercalcemia at least since 2018.  Patient is a poor historian, with cognitive deficit, accompanied by his daughter.  -He is tolerating his Sensipar.  He has no new complaints today.  -He is not a surgical candidate for hyperparathyroidism.  -He is not taking any calcium supplements. His bone density study is consistent with osteopenia.  No prior history of fragility fractures or falls. No history of  kidney stones.  he is not on HCTZ or other thiazide therapy.  He has vitamin D deficiency and supplement with cholecalciferol 1000 units daily.   -  he eats dairy and green, leafy, vegetables on average  amounts.  he does not have a family history of hypercalcemia, pituitary tumors, thyroid cancer, or osteoporosis.    ROS: Limited as above. PE: BP 112/70   Pulse 88   Ht '5\' 6"'$  (1.676 m)   Wt 197 lb 3.2 oz (89.4 kg)   BMI 31.83 kg/m , Body mass index is 31.83 kg/m. Wt Readings from Last 3 Encounters:  09/08/21 197 lb 3.2 oz (89.4 kg)  08/30/21 195 lb 4.8 oz (88.6 kg)  07/28/21 198 lb (89.8 kg)         CMP ( most recent) CMP     Component Value Date/Time   NA 142 08/26/2021 1630   K 4.5 08/26/2021 1630   CL 102 08/26/2021 1630   CO2 21 08/26/2021 1630   GLUCOSE 85 08/26/2021 1630   GLUCOSE 85 08/11/2020 0904   BUN 15 08/26/2021 1630   CREATININE 1.52 (H) 08/26/2021 1630   CREATININE 1.66 (H) 08/11/2020 0904    CALCIUM 9.5 08/26/2021 1630   PROT 6.9 08/26/2021 1630   ALBUMIN 4.3 08/26/2021 1630   AST 17 08/26/2021 1630   ALT 12 08/26/2021 1630   ALKPHOS 79 08/26/2021 1630   BILITOT 0.7 08/26/2021 1630   GFRNONAA 34 (L) 07/22/2020 1144   GFRAA 40 (L) 07/22/2020 1144     Lipid Panel ( most recent) Lipid Panel     Component Value Date/Time   CHOL 162 08/26/2021 1630   TRIG 141 08/26/2021 1630   HDL 37 (L) 08/26/2021 1630   CHOLHDL 4.4 08/26/2021 1630   CHOLHDL 4.3 03/18/2020 1021   VLDL 43 (H) 09/07/2016 0801   LDLCALC 100 (H) 08/26/2021 1630   LDLCALC 100 (H) 03/18/2020 1021   LDLDIRECT 130 (H) 11/05/2018 1159   LABVLDL 25 08/26/2021 1630      Lab Results  Component Value Date   TSH 2.510 10/29/2019   TSH 2.02 07/23/2019   TSH 1.89 12/04/2017   TSH 1.40 04/26/2015   FREET4 1.22 10/29/2019    November 13, 2019 bone density results: DualFemur Neck Left 11/05/2019 82.0 N/A -2.2 0.786 g/cm2   Left Forearm Radius 33% 11/05/2019 82.0 N/A -0.3 0.782 g/cm2 ASSESSMENT: BMD as determined from Femur Neck Left is 0.786 g/cm2 with a T-score of -2.2. This patient is considered osteopenic by World Healh Organization (WHO) Criteria. Lumbar spine was excluded due to advanced degenerative changes.    Assessment: 1. Hypercalcemia / Hyperparathyroidism 2.  Osteopenia 3.  Vitamin D deficiency  Plan: His presentation is consistent with mild, primary hyperparathyroidism.  He has stage 3-4 CKD.  He is not a surgical candidate.  He is responding to low-dose Sensipar.  He is advised to lower his Sensipar to 30 mg p.o. every other day with breakfast with plan to repeat PTH/calcium and office visit in 6 months.      He has CKD and osteopenia.  He is advised to maintain close follow-up with the nephrologist.  Side effects and precautions discussed with him.    Regarding his vitamin D deficiency: He is advised to continue vitamin D supplements.   He is advised to continue close follow-up with  his PMD .     I spent 25 minutes in the care of the patient today including review of labs from Thyroid Function, CMP, and other relevant labs ; imaging/biopsy records (current and previous including abstractions from other facilities); face-to-face time discussing  his lab results and symptoms, medications doses, his options of short and long term treatment based on the latest standards  of care / guidelines;   and documenting the encounter.  [name]  participated in the discussions, expressed understanding, and voiced agreement with the above plans.  All questions were answered to his satisfaction. he is encouraged to contact clinic should he have any questions or concerns prior to his return visit.   - Return in about 6 months (around 03/11/2022) for F/U with Pre-visit Labs.   Glade Lloyd, MD [facility] [address] [place], [place] [address] Phone: [phone]  Fax: [phone]    This note was partially dictated with voice recognition software. Similar sounding words can be transcribed inadequately or may not  be corrected upon review.  09/08/2021, 1:35 PM

## 2021-09-23 DIAGNOSIS — R2689 Other abnormalities of gait and mobility: Secondary | ICD-10-CM | POA: Diagnosis not present

## 2021-09-23 DIAGNOSIS — R262 Difficulty in walking, not elsewhere classified: Secondary | ICD-10-CM | POA: Diagnosis not present

## 2021-09-23 DIAGNOSIS — Z9181 History of falling: Secondary | ICD-10-CM | POA: Diagnosis not present

## 2021-09-23 DIAGNOSIS — M6281 Muscle weakness (generalized): Secondary | ICD-10-CM | POA: Diagnosis not present

## 2021-10-11 ENCOUNTER — Encounter (HOSPITAL_BASED_OUTPATIENT_CLINIC_OR_DEPARTMENT_OTHER): Payer: Self-pay | Admitting: Family Medicine

## 2021-10-12 ENCOUNTER — Ambulatory Visit (HOSPITAL_BASED_OUTPATIENT_CLINIC_OR_DEPARTMENT_OTHER): Payer: Medicare HMO | Admitting: Family Medicine

## 2021-10-23 DIAGNOSIS — R262 Difficulty in walking, not elsewhere classified: Secondary | ICD-10-CM | POA: Diagnosis not present

## 2021-10-23 DIAGNOSIS — M6281 Muscle weakness (generalized): Secondary | ICD-10-CM | POA: Diagnosis not present

## 2021-10-23 DIAGNOSIS — Z9181 History of falling: Secondary | ICD-10-CM | POA: Diagnosis not present

## 2021-10-23 DIAGNOSIS — R2689 Other abnormalities of gait and mobility: Secondary | ICD-10-CM | POA: Diagnosis not present

## 2021-11-01 ENCOUNTER — Other Ambulatory Visit: Payer: Self-pay | Admitting: "Endocrinology

## 2021-11-23 DIAGNOSIS — M6281 Muscle weakness (generalized): Secondary | ICD-10-CM | POA: Diagnosis not present

## 2021-11-23 DIAGNOSIS — R262 Difficulty in walking, not elsewhere classified: Secondary | ICD-10-CM | POA: Diagnosis not present

## 2021-11-23 DIAGNOSIS — R2689 Other abnormalities of gait and mobility: Secondary | ICD-10-CM | POA: Diagnosis not present

## 2021-11-23 DIAGNOSIS — Z9181 History of falling: Secondary | ICD-10-CM | POA: Diagnosis not present

## 2021-12-23 DIAGNOSIS — R2689 Other abnormalities of gait and mobility: Secondary | ICD-10-CM | POA: Diagnosis not present

## 2021-12-23 DIAGNOSIS — M6281 Muscle weakness (generalized): Secondary | ICD-10-CM | POA: Diagnosis not present

## 2021-12-23 DIAGNOSIS — R262 Difficulty in walking, not elsewhere classified: Secondary | ICD-10-CM | POA: Diagnosis not present

## 2021-12-23 DIAGNOSIS — Z9181 History of falling: Secondary | ICD-10-CM | POA: Diagnosis not present

## 2022-01-07 ENCOUNTER — Other Ambulatory Visit: Payer: Self-pay | Admitting: "Endocrinology

## 2022-01-09 ENCOUNTER — Other Ambulatory Visit: Payer: Self-pay | Admitting: Nurse Practitioner

## 2022-01-16 ENCOUNTER — Other Ambulatory Visit: Payer: Self-pay | Admitting: Nurse Practitioner

## 2022-01-23 DIAGNOSIS — R262 Difficulty in walking, not elsewhere classified: Secondary | ICD-10-CM | POA: Diagnosis not present

## 2022-01-23 DIAGNOSIS — R2689 Other abnormalities of gait and mobility: Secondary | ICD-10-CM | POA: Diagnosis not present

## 2022-01-23 DIAGNOSIS — M6281 Muscle weakness (generalized): Secondary | ICD-10-CM | POA: Diagnosis not present

## 2022-01-23 DIAGNOSIS — Z9181 History of falling: Secondary | ICD-10-CM | POA: Diagnosis not present

## 2022-01-31 ENCOUNTER — Encounter: Payer: Medicare HMO | Admitting: Nurse Practitioner

## 2022-02-14 ENCOUNTER — Other Ambulatory Visit: Payer: Self-pay | Admitting: Medical

## 2022-02-14 ENCOUNTER — Ambulatory Visit (INDEPENDENT_AMBULATORY_CARE_PROVIDER_SITE_OTHER): Payer: Medicare HMO | Admitting: Medical

## 2022-02-14 VITALS — BP 110/70 | HR 68 | Wt 192.2 lb

## 2022-02-14 DIAGNOSIS — F419 Anxiety disorder, unspecified: Secondary | ICD-10-CM | POA: Diagnosis not present

## 2022-02-14 DIAGNOSIS — E7849 Other hyperlipidemia: Secondary | ICD-10-CM

## 2022-02-14 DIAGNOSIS — E559 Vitamin D deficiency, unspecified: Secondary | ICD-10-CM

## 2022-02-14 DIAGNOSIS — N183 Chronic kidney disease, stage 3 unspecified: Secondary | ICD-10-CM

## 2022-02-14 DIAGNOSIS — E212 Other hyperparathyroidism: Secondary | ICD-10-CM | POA: Diagnosis not present

## 2022-02-14 DIAGNOSIS — N3281 Overactive bladder: Secondary | ICD-10-CM | POA: Diagnosis not present

## 2022-02-14 DIAGNOSIS — F32A Depression, unspecified: Secondary | ICD-10-CM | POA: Diagnosis not present

## 2022-02-14 DIAGNOSIS — K5901 Slow transit constipation: Secondary | ICD-10-CM | POA: Diagnosis not present

## 2022-02-14 DIAGNOSIS — G3184 Mild cognitive impairment, so stated: Secondary | ICD-10-CM | POA: Diagnosis not present

## 2022-02-14 DIAGNOSIS — I10 Essential (primary) hypertension: Secondary | ICD-10-CM

## 2022-02-14 MED ORDER — PAROXETINE HCL 10 MG PO TABS: 10.0000 mg | ORAL_TABLET | Freq: Every day | ORAL | 1 refills | Status: DC

## 2022-02-14 NOTE — Progress Notes (Unsigned)
Subjective: Chief Complaint  Patient presents with   new pt     New pt get established. Having some constipation, been going on for years  declines any shots   Here today accompanied by his granddaughter [name] Doe, who helps look after him.  She also takes him to his doctor appointments.  He lives in [place] but most of his doctors visits are in [place]  Was seeing PCP in [place] prior.  He is widowed, wife died 24 years ago, lives with one of his sons and daughter-in-law, 2 of his children passed away including 1 last year.  His granddaughter noticed that one of his biggest concerns is his mood.  Stays down a lot.  He often talks about the loss of his children.  He has prior not been agreeable to counseling.  No prior medication for mood.  He has some mild cognitive delay but does take care of all of his ADLs.  He still drives short distances.  They feel like he is just got some mild memory loss which may have progressed in recent months.  There might be a concern for hearing loss as well.  Hypertension-compliant with medication  OAB-compliant with medication  Constipation-takes MiraLAX sometimes.  This helps sometimes but feels like he needs something else to help  He has hyperparathyroidism-sees endocrinology, on Sensipar and vitamin D supplement  Hyperlipidemia-compliant with medication  Walks about a mile several days per week for exercise  Past Medical History:  Diagnosis Date   Arthritis    CKD (chronic kidney disease), stage III (HCC)    GERD (gastroesophageal reflux disease)    Gout    Hyperlipidemia    Hypertension    RBBB    Current Outpatient Medications on File Prior to Visit  Medication Sig Dispense Refill   acetaminophen (TYLENOL) 500 MG tablet Take 1 tablet (500 mg total) by mouth every 6 (six) hours as needed for moderate pain. 30 tablet 0   atorvastatin (LIPITOR) 10 MG tablet Take 1 tablet (10 mg total) by mouth daily. 90  tablet 1   Cholecalciferol (VITAMIN D3) 125 MCG (5000 UT) CAPS Take 1 capsule (5,000 Units total) by mouth daily. 90 capsule 1   cinacalcet (SENSIPAR) 30 MG tablet TAKE 1 TABLET EVERY DAY WITH BREAKFAST 90 tablet 3   diclofenac Sodium (VOLTAREN) 1 % GEL Apply 4 g topically 4 (four) times daily. 50 g 0   losartan (COZAAR) 50 MG tablet Take 1 tablet (50 mg total) by mouth daily. 90 tablet 1   mirabegron ER (MYRBETRIQ) 25 MG TB24 tablet Take 1 tablet (25 mg total) by mouth daily. 90 tablet 1   polyethylene glycol powder (GLYCOLAX/MIRALAX) 17 GM/SCOOP powder Take 17 g by mouth daily as needed. 3350 g 1   traZODone (DESYREL) 50 MG tablet TAKE 1/2 TABLET(25 MG) BY MOUTH AT BEDTIME 45 tablet 3   No current facility-administered medications on file prior to visit.   ROS as in subjective    Objective: BP 110/70   Pulse 68   Wt 192 lb 3.2 oz (87.2 kg)   BMI 31.02 kg/m   Wt Readings from Last 3 Encounters:  02/14/22 192 lb 3.2 oz (87.2 kg)  09/08/21 197 lb 3.2 oz (89.4 kg)  08/30/21 195 lb 4.8 oz (88.6 kg)   BP Readings from Last 3 Encounters:  02/14/22 110/70  09/08/21 112/70  08/30/21 122/73   General: Well-developed well-nourished no acute distress HEENT unremarkable, no impacted cerumen, TMs  normal-appearing Heart regular rate rhythm, normal S1-S2, no murmurs Lungs clear to No lower extremity edema Pulses within normal limits Psych: Pleasant, answers questions appropriately, but he does defer to his granddaughter for some of the questions     Assessment: Encounter Diagnoses  Name Primary?   Anxiety and depression Yes   Mild cognitive impairment    Essential hypertension, benign    Other hyperparathyroidism (HCC)    Stage 3 chronic kidney disease, unspecified whether stage 3a or 3b CKD (HCC)    Overactive bladder    Vitamin D deficiency    Other hyperlipidemia    Hypercalcemia    Slow transit constipation      Plan: Mild cognitive delay-continue to  monitor  Anxiety depression-referral to counseling and he is agreeable to beginning low-dose paroxetine.  We discussed risk and benefits proper use of medication.  He still has a lot of grief from the loss of 2 of his children including 1 last year and his wife who passed away 6 years ago.  Hypertension-continue current medication  Hyperparathyroidism and elevated calcium-sees endocrinology, updated labs today, continue Sensipar, follow-up with endocrinology  Overactive bladder-currently on Myrbetriq  Vitamin D deficiency-on supplement, updated labs today  Constipation-I recommended fiber intake, water intake at least 60 ounces daily, continue MiraLAX.  Consider change to Trulance or Amitiza  CKD 3-routine labs today, avoid NSAIDs  Donyel was seen today for new pt .  Diagnoses and all orders for this visit:  Anxiety and depression -     TSH -     Ambulatory referral to Behavioral Health  Mild cognitive impairment  Essential hypertension, benign  Other hyperparathyroidism ([place]) -     Basic metabolic panel -     PTH, Intact and Calcium  Stage 3 chronic kidney disease, unspecified whether stage 3a or 3b CKD (HCC) -     Basic metabolic panel  Overactive bladder  Vitamin D deficiency -     VITAMIN D 25 Hydroxy (Vit-D Deficiency, Fractures)  Other hyperlipidemia  Hypercalcemia  Slow transit constipation -     TSH  Other orders -     PARoxetine (PAXIL) 10 MG tablet; Take 1 tablet (10 mg total) by mouth daily.    F/u pending labs

## 2022-02-15 LAB — BASIC METABOLIC PANEL
BUN/Creatinine Ratio: 8 — ABNORMAL LOW (ref 10–24)
BUN: 14 mg/dL (ref 8–27)
CO2: 23 mmol/L (ref 20–29)
Calcium: 9.9 mg/dL (ref 8.6–10.2)
Chloride: 104 mmol/L (ref 96–106)
Creatinine, Ser: 1.71 mg/dL — ABNORMAL HIGH (ref 0.76–1.27)
Glucose: 92 mg/dL (ref 70–99)
Potassium: 5.2 mmol/L (ref 3.5–5.2)
Sodium: 142 mmol/L (ref 134–144)
eGFR: 39 mL/min/{1.73_m2} — ABNORMAL LOW (ref >59)

## 2022-02-15 LAB — TSH: TSH: 2.33 u[IU]/mL (ref 0.450–4.500)

## 2022-02-15 LAB — VITAMIN D 25 HYDROXY (VIT D DEFICIENCY, FRACTURES): Vit D, 25-Hydroxy: 52.7 ng/mL (ref 30.0–100.0)

## 2022-02-15 LAB — PTH, INTACT AND CALCIUM: PTH: 58 pg/mL (ref 15–65)

## 2022-02-16 NOTE — Progress Notes (Signed)
Kidney marker little worse from the prior , electrolytes otherwise okay, the parathyroid hormone is stable, vitamin D okay, thyroid okay.  continue current medications.  Begin the new medication for mood and expect phone call about counseling  Recheck with me in 1 month

## 2022-02-23 DIAGNOSIS — R2689 Other abnormalities of gait and mobility: Secondary | ICD-10-CM | POA: Diagnosis not present

## 2022-02-23 DIAGNOSIS — Z9181 History of falling: Secondary | ICD-10-CM | POA: Diagnosis not present

## 2022-02-23 DIAGNOSIS — R262 Difficulty in walking, not elsewhere classified: Secondary | ICD-10-CM | POA: Diagnosis not present

## 2022-02-23 DIAGNOSIS — M6281 Muscle weakness (generalized): Secondary | ICD-10-CM | POA: Diagnosis not present

## 2022-02-27 ENCOUNTER — Ambulatory Visit: Payer: Medicare HMO | Admitting: Family Medicine

## 2022-03-13 ENCOUNTER — Ambulatory Visit (INDEPENDENT_AMBULATORY_CARE_PROVIDER_SITE_OTHER): Payer: Medicare HMO | Admitting: "Endocrinology

## 2022-03-13 ENCOUNTER — Encounter: Payer: Self-pay | Admitting: "Endocrinology

## 2022-03-13 VITALS — BP 134/66 | HR 92 | Ht 66.0 in | Wt 193.0 lb

## 2022-03-13 DIAGNOSIS — E210 Primary hyperparathyroidism: Secondary | ICD-10-CM

## 2022-03-13 DIAGNOSIS — E212 Other hyperparathyroidism: Secondary | ICD-10-CM

## 2022-03-13 MED ORDER — CINACALCET HCL 30 MG PO TABS: ORAL_TABLET | ORAL | 3 refills | Status: DC

## 2022-03-13 NOTE — Progress Notes (Signed)
03/13/2022, 1:59 PM  Endocrinology follow-up note  Scott Burton is a 87 y.o.-year-old male, returning for follow-up after he was seen in consultation for hypercalcemia/hyperparathyroidism.  He was initially referred by his PCP  his  Carlena Hurl, PA-C .  He is accompanied by his daughter to the clinic today.  He is currently on Sensipar 30 mg p.o. daily at breakfast.  His previsit labs show calcium controlled at 9.9, low with controlled PTH at 58.     Past Medical History:  Diagnosis Date   Arthritis    CKD (chronic kidney disease), stage III (HCC)    GERD (gastroesophageal reflux disease)    Gout    Hyperlipidemia    Hypertension    RBBB     Past Surgical History:  Procedure Laterality Date   COLONOSCOPY N/A 04/19/2016   Procedure: COLONOSCOPY;  Surgeon: Danie Binder, MD;  Location: [facility];  Service: Endoscopy;  Laterality: N/A;  1130    NO PAST SURGERIES      Social History   Tobacco Use   Smoking status: Former    Types: Cigarettes    Quit date: 01/17/1995    Years since quitting: 27.1    Passive exposure: Past   Smokeless tobacco: Former    Quit date: 01/28/1991  Vaping Use   Vaping Use: Never used  Substance Use Topics   Alcohol use: No   Drug use: No    Family History  Problem Relation Age of Onset   Cancer Father    Dementia Mother     Outpatient Encounter Medications as of 03/13/2022  Medication Sig   acetaminophen (TYLENOL) 500 MG tablet Take 1 tablet (500 mg total) by mouth every 6 (six) hours as needed for moderate pain.   atorvastatin (LIPITOR) 10 MG tablet Take 1 tablet (10 mg total) by mouth daily.   Cholecalciferol (VITAMIN D3) 125 MCG (5000 UT) CAPS Take 1 capsule (5,000 Units total) by mouth daily.   cinacalcet (SENSIPAR) 30 MG tablet TAKE 1 TABLET EVERY DAY WITH BREAKFAST   diclofenac Sodium (VOLTAREN) 1 % GEL Apply 4 g topically 4 (four) times daily.   losartan (COZAAR) 50 MG tablet Take 1 tablet (50 mg total) by mouth  daily.   mirabegron ER (MYRBETRIQ) 25 MG TB24 tablet Take 1 tablet (25 mg total) by mouth daily.   PARoxetine (PAXIL) 10 MG tablet Take 1 tablet (10 mg total) by mouth daily.   polyethylene glycol powder (GLYCOLAX/MIRALAX) 17 GM/SCOOP powder Take 17 g by mouth daily as needed.   traZODone (DESYREL) 50 MG tablet TAKE 1/2 TABLET(25 MG) BY MOUTH AT BEDTIME   [DISCONTINUED] cinacalcet (SENSIPAR) 30 MG tablet TAKE 1 TABLET EVERY DAY WITH BREAKFAST   No facility-administered encounter medications on file as of 03/13/2022.    No Known Allergies   HPI  Scott Burton was diagnosed with hypercalcemia at least since 2018.  Patient is a poor historian, with cognitive deficit, accompanied by his daughter.  -He is tolerating his Sensipar 30 mg p.o. daily at breakfast.  He has no new complaints today.  -He is not a surgical candidate for hyperparathyroidism.  -He is not taking any calcium supplements. His bone density study is consistent with osteopenia.  No prior history of fragility fractures or falls. No history of  kidney stones.  he is not on HCTZ or other thiazide therapy.  He has vitamin D deficiency and supplement with cholecalciferol 1000 units daily.   -  he eats dairy and green, leafy, vegetables on average amounts.  he does not have a family history of hypercalcemia, pituitary tumors, thyroid cancer, or osteoporosis.    ROS: Limited as above. PE: BP 134/66   Pulse 92   Ht '5\' 6"'$  (1.676 m)   Wt 193 lb (87.5 kg)   BMI 31.15 kg/m , Body mass index is 31.15 kg/m. Wt Readings from Last 3 Encounters:  03/13/22 193 lb (87.5 kg)  02/14/22 192 lb 3.2 oz (87.2 kg)  09/08/21 197 lb 3.2 oz (89.4 kg)         CMP ( most recent) CMP     Component Value Date/Time   NA 142 02/14/2022 1244   K 5.2 02/14/2022 1244   CL 104 02/14/2022 1244   CO2 23 02/14/2022 1244   GLUCOSE 92 02/14/2022 1244   GLUCOSE 85 08/11/2020 0904   BUN 14 02/14/2022 1244   CREATININE 1.71 (H) 02/14/2022  1244   CREATININE 1.66 (H) 08/11/2020 0904   CALCIUM 9.9 02/14/2022 1244   PROT 6.9 08/26/2021 1630   ALBUMIN 4.3 08/26/2021 1630   AST 17 08/26/2021 1630   ALT 12 08/26/2021 1630   ALKPHOS 79 08/26/2021 1630   BILITOT 0.7 08/26/2021 1630   GFRNONAA 34 (L) 07/22/2020 1144   GFRAA 40 (L) 07/22/2020 1144     Lipid Panel ( most recent) Lipid Panel     Component Value Date/Time   CHOL 162 08/26/2021 1630   TRIG 141 08/26/2021 1630   HDL 37 (L) 08/26/2021 1630   CHOLHDL 4.4 08/26/2021 1630   CHOLHDL 4.3 03/18/2020 1021   VLDL 43 (H) 09/07/2016 0801   LDLCALC 100 (H) 08/26/2021 1630   LDLCALC 100 (H) 03/18/2020 1021   LDLDIRECT 130 (H) 11/05/2018 1159   LABVLDL 25 08/26/2021 1630      Lab Results  Component Value Date   TSH 2.330 02/14/2022   TSH 2.510 10/29/2019   TSH 2.02 07/23/2019   TSH 1.89 12/04/2017   TSH 1.40 04/26/2015   FREET4 1.22 10/29/2019    November 13, 2019 bone density results: DualFemur Neck Left 11/05/2019 82.0 N/A -2.2 0.786 g/cm2   Left Forearm Radius 33% 11/05/2019 82.0 N/A -0.3 0.782 g/cm2 ASSESSMENT: BMD as determined from Femur Neck Left is 0.786 g/cm2 with a T-score of -2.2. This patient is considered osteopenic by World Heal[employer] (WHO) Criteria. Lumbar spine was excluded due to advanced degenerative changes.    Assessment: 1. Hypercalcemia / Hyperparathyroidism 2.  Osteopenia 3.  Vitamin D deficiency  Plan: His presentation has been consistent with mild, primary hyperparathyroidism.  He is responding to low-dose Sensipar.   He has stage 3-4 CKD.  He is not a surgical candidate.  He is responding to low-dose Sensipar.  He is advised to continue Sensipar  30 mg p.o. every  day with breakfast with plan to repeat PTH/calcium and office visit in 6 months.  Side effects and precautions discussed with him.      He has CKD and osteopenia.  He is advised to maintain close follow-up with the nephrologist, PMD.   Regarding his vitamin D  deficiency: He is advised to continue vitamin D supplements.   He is advised to continue close follow-up with his PMD .   I spent  20  minutes in the care of the patient today including review of labs from Thyroid Function, CMP, and other relevant labs ; imaging/biopsy records (current and previous including abstractions from other facilities); face-to-face time discussing  his lab results and symptoms, medications doses, his options of short and long term treatment based on the latest standards of care / guidelines;   and documenting the encounter.  [name]  participated in the discussions, expressed understanding, and voiced agreement with the above plans.  All questions were answered to his satisfaction. he is encouraged to contact clinic should he have any questions or concerns prior to his return visit.   - Return in about 6 months (around 09/11/2022) for F/U with Pre-visit Labs.   Glade Lloyd, MD [facility] [address] [place], [place] [address] Phone: [phone]  Fax: [phone]    This note was partially dictated with voice recognition software. Similar sounding words can be transcribed inadequately or may not  be corrected upon review.  03/13/2022, 1:59 PM

## 2022-03-17 ENCOUNTER — Ambulatory Visit: Payer: Medicare HMO | Admitting: Medical

## 2022-03-17 NOTE — Progress Notes (Signed)
Subjective: No chief complaint on file.  Here for med check follow-up.  I saw him as a new patient February 14, 2022.   Here today accompanied by his granddaughter [name] Doe, who helps look after him.  She also takes him to his doctor appointments.  He lives in [place] but most of his doctors visits are in [place]     Was seeing PCP in [place] prior.  He is widowed, wife died 29 years ago, lives with one of his sons and daughter-in-law, 2 of his children passed away including 1 last year.  His granddaughter noticed that one of his biggest concerns is his mood.  Stays down a lot.  He often talks about the loss of his children.  He has prior not been agreeable to counseling.  No prior medication for mood.  He has some mild cognitive delay but does take care of all of his ADLs.  He still drives short distances.  They feel like he is just got some mild memory loss which may have progressed in recent months.  There might be a concern for hearing loss as well.  Hypertension-compliant with medication  OAB-compliant with medication  Constipation-takes MiraLAX sometimes.  This helps sometimes but feels like he needs something else to help  He has hyperparathyroidism-sees endocrinology, on Sensipar and vitamin D supplement  Hyperlipidemia-compliant with medication  Walks about a mile several days per week for exercise  Past Medical History:  Diagnosis Date   Arthritis    CKD (chronic kidney disease), stage III (HCC)    GERD (gastroesophageal reflux disease)    Gout    Hyperlipidemia    Hypertension    RBBB    Current Outpatient Medications on File Prior to Visit  Medication Sig Dispense Refill   acetaminophen (TYLENOL) 500 MG tablet Take 1 tablet (500 mg total) by mouth every 6 (six) hours as needed for moderate pain. 30 tablet 0   atorvastatin (LIPITOR) 10 MG tablet Take 1 tablet (10 mg total) by mouth daily. 90 tablet 1   Cholecalciferol  (VITAMIN D3) 125 MCG (5000 UT) CAPS Take 1 capsule (5,000 Units total) by mouth daily. 90 capsule 1   cinacalcet (SENSIPAR) 30 MG tablet TAKE 1 TABLET EVERY DAY WITH BREAKFAST 90 tablet 3   diclofenac Sodium (VOLTAREN) 1 % GEL Apply 4 g topically 4 (four) times daily. 50 g 0   losartan (COZAAR) 50 MG tablet Take 1 tablet (50 mg total) by mouth daily. 90 tablet 1   mirabegron ER (MYRBETRIQ) 25 MG TB24 tablet Take 1 tablet (25 mg total) by mouth daily. 90 tablet 1   PARoxetine (PAXIL) 10 MG tablet Take 1 tablet (10 mg total) by mouth daily. 30 tablet 1   polyethylene glycol powder (GLYCOLAX/MIRALAX) 17 GM/SCOOP powder Take 17 g by mouth daily as needed. 3350 g 1   traZODone (DESYREL) 50 MG tablet TAKE 1/2 TABLET(25 MG) BY MOUTH AT BEDTIME 45 tablet 3   No current facility-administered medications on file prior to visit.   ROS as in subjective    Objective: There were no vitals taken for this visit.  Wt Readings from Last 3 Encounters:  03/13/22 193 lb (87.5 kg)  02/14/22 192 lb 3.2 oz (87.2 kg)  09/08/21 197 lb 3.2 oz (89.4 kg)   BP Readings from Last 3 Encounters:  03/13/22 134/66  02/14/22 110/70  09/08/21 112/70   General: Well-developed well-nourished no acute distress HEENT unremarkable, no impacted cerumen, TMs normal-appearing  Heart regular rate rhythm, normal S1-S2, no murmurs Lungs clear to No lower extremity edema Pulses within normal limits Psych: Pleasant, answers questions appropriately, but he does defer to his granddaughter for some of the questions     Assessment: No diagnosis found.    Plan:       Mild cognitive delay-continue to monitor  Anxiety depression-referral to counseling and he is agreeable to beginning low-dose paroxetine.  We discussed risk and benefits proper use of medication.  He still has a lot of grief from the loss of 2 of his children including 1 last year and his wife who passed away 6 years ago.  Hypertension-continue current  medication  Hyperparathyroidism and elevated calcium-sees endocrinology, updated labs today, continue Sensipar, follow-up with endocrinology  Overactive bladder-currently on Myrbetriq  Vitamin D deficiency-on supplement, updated labs today  Constipation-I recommended fiber intake, water intake at least 60 ounces daily, continue MiraLAX.  Consider change to Trulance or Amitiza  CKD 3-routine labs today, avoid NSAIDs  There are no diagnoses linked to this encounter.   F/u pending labs

## 2022-03-24 DIAGNOSIS — R2689 Other abnormalities of gait and mobility: Secondary | ICD-10-CM | POA: Diagnosis not present

## 2022-03-24 DIAGNOSIS — Z9181 History of falling: Secondary | ICD-10-CM | POA: Diagnosis not present

## 2022-03-24 DIAGNOSIS — M6281 Muscle weakness (generalized): Secondary | ICD-10-CM | POA: Diagnosis not present

## 2022-03-24 DIAGNOSIS — R262 Difficulty in walking, not elsewhere classified: Secondary | ICD-10-CM | POA: Diagnosis not present

## 2022-03-29 ENCOUNTER — Encounter: Payer: Self-pay | Admitting: Medical

## 2022-04-04 ENCOUNTER — Other Ambulatory Visit: Payer: Self-pay

## 2022-04-04 DIAGNOSIS — F32A Depression, unspecified: Secondary | ICD-10-CM

## 2022-04-04 MED ORDER — LOSARTAN POTASSIUM 50 MG PO TABS: 50.0000 mg | ORAL_TABLET | Freq: Every day | ORAL | 1 refills | Status: DC

## 2022-04-04 MED ORDER — VITAMIN D3 125 MCG (5000 UT) PO CAPS: 5000.0000 [IU] | ORAL_CAPSULE | Freq: Every day | ORAL | 1 refills | Status: DC

## 2022-04-04 MED ORDER — MIRABEGRON ER 25 MG PO TB24: 25.0000 mg | ORAL_TABLET | Freq: Every day | ORAL | 1 refills | Status: DC

## 2022-04-04 MED ORDER — ATORVASTATIN CALCIUM 10 MG PO TABS: 10.0000 mg | ORAL_TABLET | Freq: Every day | ORAL | 1 refills | Status: DC

## 2022-04-05 ENCOUNTER — Ambulatory Visit (INDEPENDENT_AMBULATORY_CARE_PROVIDER_SITE_OTHER): Payer: Medicare HMO | Admitting: Medical

## 2022-04-05 VITALS — BP 134/80 | HR 73 | Wt 193.8 lb

## 2022-04-05 DIAGNOSIS — F99 Mental disorder, not otherwise specified: Secondary | ICD-10-CM

## 2022-04-05 DIAGNOSIS — M5136 Other intervertebral disc degeneration, lumbar region: Secondary | ICD-10-CM

## 2022-04-05 DIAGNOSIS — N183 Chronic kidney disease, stage 3 unspecified: Secondary | ICD-10-CM

## 2022-04-05 DIAGNOSIS — E559 Vitamin D deficiency, unspecified: Secondary | ICD-10-CM | POA: Diagnosis not present

## 2022-04-05 DIAGNOSIS — M79641 Pain in right hand: Secondary | ICD-10-CM

## 2022-04-05 DIAGNOSIS — K5901 Slow transit constipation: Secondary | ICD-10-CM

## 2022-04-05 DIAGNOSIS — F5105 Insomnia due to other mental disorder: Secondary | ICD-10-CM | POA: Diagnosis not present

## 2022-04-05 DIAGNOSIS — M159 Polyosteoarthritis, unspecified: Secondary | ICD-10-CM

## 2022-04-05 DIAGNOSIS — G3184 Mild cognitive impairment, so stated: Secondary | ICD-10-CM

## 2022-04-05 DIAGNOSIS — I10 Essential (primary) hypertension: Secondary | ICD-10-CM | POA: Diagnosis not present

## 2022-04-05 DIAGNOSIS — F32A Depression, unspecified: Secondary | ICD-10-CM

## 2022-04-05 DIAGNOSIS — E7849 Other hyperlipidemia: Secondary | ICD-10-CM

## 2022-04-05 DIAGNOSIS — F419 Anxiety disorder, unspecified: Secondary | ICD-10-CM

## 2022-04-05 MED ORDER — TRAZODONE HCL 50 MG PO TABS: ORAL_TABLET | ORAL | 3 refills | Status: DC

## 2022-04-05 MED ORDER — DICLOFENAC SODIUM 1 % EX GEL: 4.0000 g | Freq: Four times a day (QID) | CUTANEOUS | 2 refills | Status: DC

## 2022-04-05 MED ORDER — LINACLOTIDE 72 MCG PO CAPS: 72.0000 ug | ORAL_CAPSULE | Freq: Every day | ORAL | 0 refills | Status: DC

## 2022-04-05 NOTE — Progress Notes (Signed)
Subjective: Chief Complaint  Patient presents with   follow-up on medication    Needs a refill on trazadone and volteran gel   Here for follow-up.  I saw him as a new patient in January  Here today accompanied by his granddaughter [name] Doe, who helps look after him.  She also takes him to his doctor appointments.  He lives in [place] but most of his doctors visits are in [place]  Was seeing PCP in [place] prior.   At his last visit he had requested something to help with his mood.  We had sent a prescription paroxetine but she says the pharmacy never had it so he has not started this medication.  He is widowed, wife died 41 years ago, lives with one of his sons and daughter-in-law, 2 of his children passed away including 1 last year.  His granddaughter noticed that one of his biggest concerns is his mood.  Stays down a lot.  He often talks about the loss of his children.  He has prior not been agreeable to counseling.  No prior medication for mood.  He has some mild cognitive delay but does take care of all of his ADLs.  He still drives short distances.  They feel like he is just got some mild memory loss which may have progressed in recent months.  There might be a concern for hearing loss as well.  Hypertension-compliant with medication  OAB-compliant with medication  Constipation-takes MiraLAX but feels like he needs something else to help  He has hyperparathyroidism-sees endocrinology, on Sensipar and vitamin D supplement  Hyperlipidemia-compliant with medication  Needs refills today on Voltaren gel for arthritis and trazodone for sleep  Walks about a mile several days per week for exercise  Past Medical History:  Diagnosis Date   Arthritis    CKD (chronic kidney disease), stage III (HCC)    GERD (gastroesophageal reflux disease)    Gout    Hyperlipidemia    Hypertension    RBBB    Current Outpatient Medications on File Prior to  Visit  Medication Sig Dispense Refill   atorvastatin (LIPITOR) 10 MG tablet Take 1 tablet (10 mg total) by mouth daily. 90 tablet 1   Cholecalciferol (VITAMIN D3) 125 MCG (5000 UT) capsule Take 1 capsule (5,000 Units total) by mouth daily. 90 capsule 1   cinacalcet (SENSIPAR) 30 MG tablet TAKE 1 TABLET EVERY DAY WITH BREAKFAST 90 tablet 3   losartan (COZAAR) 50 MG tablet Take 1 tablet (50 mg total) by mouth daily. 90 tablet 1   mirabegron ER (MYRBETRIQ) 25 MG TB24 tablet Take 1 tablet (25 mg total) by mouth daily. 90 tablet 1   PARoxetine (PAXIL) 10 MG tablet Take 1 tablet (10 mg total) by mouth daily. 30 tablet 1   polyethylene glycol powder (GLYCOLAX/MIRALAX) 17 GM/SCOOP powder Take 17 g by mouth daily as needed. 3350 g 1   No current facility-administered medications on file prior to visit.   ROS as in subjective    Objective: BP 134/80   Pulse 73   Wt 193 lb 12.8 oz (87.9 kg)   BMI 31.28 kg/m   Wt Readings from Last 3 Encounters:  04/05/22 193 lb 12.8 oz (87.9 kg)  03/13/22 193 lb (87.5 kg)  02/14/22 192 lb 3.2 oz (87.2 kg)   BP Readings from Last 3 Encounters:  04/05/22 134/80  03/13/22 134/66  02/14/22 110/70   General: Well-developed well-nourished no acute distress Heart regular  rate rhythm, normal S1-S2, no murmurs Lungs clear No lower extremity edema Pulses within normal limits Psych: Pleasant, answers questions appropriately, but he does defer to his granddaughter for some of the questions     Assessment: Encounter Diagnoses  Name Primary?   Pain of right hand    DDD (degenerative disc disease), lumbar Yes   Primary osteoarthritis involving multiple joints    Essential hypertension, benign    Stage 3 chronic kidney disease, unspecified whether stage 3a or 3b CKD (HCC)    Slow transit constipation    Vitamin D deficiency    Insomnia due to other mental disorder    Mild cognitive impairment    Other hyperlipidemia    Anxiety and depression        Plan: Mild cognitive delay-continue to monitor  Anxiety depression-there apparently was a miscommunication between the family and the pharmacy.  I called the pharmacy and they did in fact receive the prescription in January but family says they never had anything stock available.  Either way they will go and get the medicine start paroxetine today.  Hypertension-continue current medication, call back with blood pressure readings in the next 2 weeks  Hyperparathyroidism and elevated calcium-sees endocrinology, continue Sensipar, follow-up with endocrinology  Overactive bladder-currently on Myrbetriq  Vitamin D deficiency-on supplement, updated labs today  Constipation-I recommended fiber intake, water intake at least 60 ounces daily, stop MiraLAX and begin trial of Linzess.  I gave 2 different doses to try every other day beginning with the low-dose every other day for the next 8 doses, then increase to the higher dose.  Call report in 2 weeks  CKD 3-avoid NSAIDs, continue current blood pressure medication  Refill trazodone which she uses for sleep  Refill Voltaren which he uses as needed for arthritis pain topically  Jacoby was seen today for follow-up on medication.  Diagnoses and all orders for this visit:  DDD (degenerative disc disease), lumbar  Pain of right hand -     diclofenac Sodium (VOLTAREN) 1 % GEL; Apply 4 g topically 4 (four) times daily.  Primary osteoarthritis involving multiple joints  Essential hypertension, benign  Stage 3 chronic kidney disease, unspecified whether stage 3a or 3b CKD (HCC)  Slow transit constipation  Vitamin D deficiency  Insomnia due to other mental disorder  Mild cognitive impairment  Other hyperlipidemia  Anxiety and depression  Other orders -     traZODone (DESYREL) 50 MG tablet; TAKE 1/2 TABLET(25 MG) BY MOUTH AT BEDTIME -     linaclotide (LINZESS) 72 MCG capsule; Take 1 capsule (72 mcg total) by mouth daily before  breakfast.    F/u with call back in 2 weeks

## 2022-04-05 NOTE — Patient Instructions (Signed)
Constipation For the next 2 weeks, hold off on Miralax or other over the counter constipation medication Begin trial of Linzess 58mcg every other day for 8 doses Then go up to 122mcg every other day for 8 doses Lets see if this works better than the [employer] with at least 60+ ounces of water daily Avoid lots of cheese and big portions   Call back in 2-3 weeks to let me know how the Paroxetine/Paxil is doing for mood   Let me know in a few weeks if BP is staying <130/80 at home

## 2022-04-24 DIAGNOSIS — Z9181 History of falling: Secondary | ICD-10-CM | POA: Diagnosis not present

## 2022-04-24 DIAGNOSIS — R262 Difficulty in walking, not elsewhere classified: Secondary | ICD-10-CM | POA: Diagnosis not present

## 2022-04-24 DIAGNOSIS — R2689 Other abnormalities of gait and mobility: Secondary | ICD-10-CM | POA: Diagnosis not present

## 2022-04-24 DIAGNOSIS — M6281 Muscle weakness (generalized): Secondary | ICD-10-CM | POA: Diagnosis not present

## 2022-05-10 ENCOUNTER — Telehealth: Payer: Self-pay | Admitting: Medical

## 2022-05-10 NOTE — Telephone Encounter (Signed)
Contacted Scott Burton to schedule their annual wellness visit. Appointment made for 05/30/22.  Scott Burton AWV direct phone # [phone]

## 2022-05-24 DIAGNOSIS — Z9181 History of falling: Secondary | ICD-10-CM | POA: Diagnosis not present

## 2022-05-24 DIAGNOSIS — M6281 Muscle weakness (generalized): Secondary | ICD-10-CM | POA: Diagnosis not present

## 2022-05-24 DIAGNOSIS — R262 Difficulty in walking, not elsewhere classified: Secondary | ICD-10-CM | POA: Diagnosis not present

## 2022-05-24 DIAGNOSIS — R2689 Other abnormalities of gait and mobility: Secondary | ICD-10-CM | POA: Diagnosis not present

## 2022-05-30 ENCOUNTER — Ambulatory Visit (HOSPITAL_COMMUNITY): Payer: Medicaid Other | Admitting: Psychiatry

## 2022-05-30 ENCOUNTER — Ambulatory Visit (INDEPENDENT_AMBULATORY_CARE_PROVIDER_SITE_OTHER): Payer: Medicare HMO

## 2022-05-30 VITALS — Ht 66.0 in | Wt 185.0 lb

## 2022-05-30 DIAGNOSIS — Z0000 Encounter for general adult medical examination without abnormal findings: Secondary | ICD-10-CM | POA: Diagnosis not present

## 2022-05-30 NOTE — Patient Instructions (Signed)
Scott Burton , Thank you for taking time to come for your Medicare Wellness Visit. I appreciate your ongoing commitment to your health goals. Please review the following plan we discussed and let me know if I can assist you in the future.   These are the goals we discussed:  Goals      Have 3 meals a day     Patient Stated     05/30/2022, no goals        This is a list of the screening recommended for you and due dates:  Health Maintenance  Topic Date Due   Zoster (Shingles) Vaccine (1 of 2) Never done   COVID-19 Vaccine (3 - Moderna risk series) 07/03/2019   Pneumonia Vaccine (1 of 1 - PCV) 02/15/2023*   Flu Shot  08/17/2022   Medicare Annual Wellness Visit  05/30/2023   HPV Vaccine  Aged Out   DTaP/Tdap/Td vaccine  Discontinued  *Topic was postponed. The date shown is not the original due date.    Advanced directives: Advance directive discussed with you today.   Conditions/risks identified: none  Next appointment: Follow up in one year for your annual wellness visit.   Preventive Care 42 Years and Older, Male  Preventive care refers to lifestyle choices and visits with your health care provider that can promote health and wellness. What does preventive care include? A yearly physical exam. This is also called an annual well check. Dental exams once or twice a year. Routine eye exams. Ask your health care provider how often you should have your eyes checked. Personal lifestyle choices, including: Daily care of your teeth and gums. Regular physical activity. Eating a healthy diet. Avoiding tobacco and drug use. Limiting alcohol use. Practicing safe sex. Taking low doses of aspirin every day. Taking vitamin and mineral supplements as recommended by your health care provider. What happens during an annual well check? The services and screenings done by your health care provider during your annual well check will depend on your age, overall health, lifestyle risk factors,  and family history of disease. Counseling  Your health care provider may ask you questions about your: Alcohol use. Tobacco use. Drug use. Emotional well-being. Home and relationship well-being. Sexual activity. Eating habits. History of falls. Memory and ability to understand (cognition). Work and work Astronomer. Screening  You may have the following tests or measurements: Height, weight, and BMI. Blood pressure. Lipid and cholesterol levels. These may be checked every 5 years, or more frequently if you are over 59 years old. Skin check. Lung cancer screening. You may have this screening every year starting at age 5 if you have a 30-pack-year history of smoking and currently smoke or have quit within the past 15 years. Fecal occult blood test (FOBT) of the stool. You may have this test every year starting at age 42. Flexible sigmoidoscopy or colonoscopy. You may have a sigmoidoscopy every 5 years or a colonoscopy every 10 years starting at age 57. Prostate cancer screening. Recommendations will vary depending on your family history and other risks. Hepatitis C blood test. Hepatitis B blood test. Sexually transmitted disease (STD) testing. Diabetes screening. This is done by checking your blood sugar (glucose) after you have not eaten for a while (fasting). You may have this done every 1-3 years. Abdominal aortic aneurysm (AAA) screening. You may need this if you are a current or former smoker. Osteoporosis. You may be screened starting at age 53 if you are at high risk. Talk with  your health care provider about your test results, treatment options, and if necessary, the need for more tests. Vaccines  Your health care provider may recommend certain vaccines, such as: Influenza vaccine. This is recommended every year. Tetanus, diphtheria, and acellular pertussis (Tdap, Td) vaccine. You may need a Td booster every 10 years. Zoster vaccine. You may need this after age  52. Pneumococcal 13-valent conjugate (PCV13) vaccine. One dose is recommended after age 23. Pneumococcal polysaccharide (PPSV23) vaccine. One dose is recommended after age 25. Talk to your health care provider about which screenings and vaccines you need and how often you need them. This information is not intended to replace advice given to you by your health care provider. Make sure you discuss any questions you have with your health care provider. Document Released: 01/29/2015 Document Revised: 09/22/2015 Document Reviewed: 11/03/2014 Elsevier Interactive Patient Education  2017 [place] Prevention in the Home Falls can cause injuries. They can happen to people of all ages. There are many things you can do to make your home safe and to help prevent falls. What can I do on the outside of my home? Regularly fix the edges of walkways and driveways and fix any cracks. Remove anything that might make you trip as you walk through a door, such as a raised step or threshold. Trim any bushes or trees on the path to your home. Use bright outdoor lighting. Clear any walking paths of anything that might make someone trip, such as rocks or tools. Regularly check to see if handrails are loose or broken. Make sure that both sides of any steps have handrails. Any raised decks and porches should have guardrails on the edges. Have any leaves, snow, or ice cleared regularly. Use sand or salt on walking paths during winter. Clean up any spills in your garage right away. This includes oil or grease spills. What can I do in the bathroom? Use night lights. Install grab bars by the toilet and in the tub and shower. Do not use towel bars as grab bars. Use non-skid mats or decals in the tub or shower. If you need to sit down in the shower, use a plastic, non-slip stool. Keep the floor dry. Clean up any water that spills on the floor as soon as it happens. Remove soap buildup in the tub or shower  regularly. Attach bath mats securely with double-sided non-slip rug tape. Do not have throw rugs and other things on the floor that can make you trip. What can I do in the bedroom? Use night lights. Make sure that you have a light by your bed that is easy to reach. Do not use any sheets or blankets that are too big for your bed. They should not hang down onto the floor. Have a firm chair that has side arms. You can use this for support while you get dressed. Do not have throw rugs and other things on the floor that can make you trip. What can I do in the kitchen? Clean up any spills right away. Avoid walking on wet floors. Keep items that you use a lot in easy-to-reach places. If you need to reach something above you, use a strong step stool that has a grab bar. Keep electrical cords out of the way. Do not use floor polish or wax that makes floors slippery. If you must use wax, use non-skid floor wax. Do not have throw rugs and other things on the floor that can make you trip.  What can I do with my stairs? Do not leave any items on the stairs. Make sure that there are handrails on both sides of the stairs and use them. Fix handrails that are broken or loose. Make sure that handrails are as long as the stairways. Check any carpeting to make sure that it is firmly attached to the stairs. Fix any carpet that is loose or worn. Avoid having throw rugs at the top or bottom of the stairs. If you do have throw rugs, attach them to the floor with carpet tape. Make sure that you have a light switch at the top of the stairs and the bottom of the stairs. If you do not have them, ask someone to add them for you. What else can I do to help prevent falls? Wear shoes that: Do not have high heels. Have rubber bottoms. Are comfortable and fit you well. Are closed at the toe. Do not wear sandals. If you use a stepladder: Make sure that it is fully opened. Do not climb a closed stepladder. Make sure that  both sides of the stepladder are locked into place. Ask someone to hold it for you, if possible. Clearly mark and make sure that you can see: Any grab bars or handrails. First and last steps. Where the edge of each step is. Use tools that help you move around (mobility aids) if they are needed. These include: Canes. Walkers. Scooters. Crutches. Turn on the lights when you go into a dark area. Replace any light bulbs as soon as they burn out. Set up your furniture so you have a clear path. Avoid moving your furniture around. If any of your floors are uneven, fix them. If there are any pets around you, be aware of where they are. Review your medicines with your doctor. Some medicines can make you feel dizzy. This can increase your chance of falling. Ask your doctor what other things that you can do to help prevent falls. This information is not intended to replace advice given to you by your health care provider. Make sure you discuss any questions you have with your health care provider. Document Released: 10/29/2008 Document Revised: 06/10/2015 Document Reviewed: 02/06/2014 Elsevier Interactive Patient Education  2017 [employer].

## 2022-05-30 NOTE — Progress Notes (Signed)
I connected with  Scott Burton on 05/30/22 by a audio enabled telemedicine application and verified that I am speaking with the correct person using two identifiers. Granddaughter Scott Burton was also on the call.  Patient Location: Home  Provider Location: Office/Clinic  I discussed the limitations of evaluation and management by telemedicine. The patient expressed understanding and agreed to proceed.  Subjective:   Scott Burton is a 87 y.o. male who presents for Medicare Annual/Subsequent preventive examination.  Review of Systems     Cardiac Risk Factors include: advanced age (>70men, >12 women);dyslipidemia;hypertension;male gender     Objective:    Today's Vitals   05/30/22 1128  Weight: 185 lb (83.9 kg)  Height: 5\' 6"  (1.676 m)   Body mass index is 29.86 kg/m.     05/30/2022   11:33 AM 08/30/2021   11:37 AM 05/17/2021   10:17 AM 04/01/2021    2:21 PM 10/15/2020   12:13 PM 07/23/2019   10:34 AM 04/30/2018    6:09 PM  Advanced Directives  Does Patient Have a Medical Advance Directive? No No No Yes Yes No No  Type of Aeronautical engineer of [place];Living will Healthcare Power of [place];Living will    Does patient want to make changes to medical advance directive?    No - Patient declined     Copy of Healthcare Power of Attorney in Chart?    No - copy requested No - copy requested    Would patient like information on creating a medical advance directive?  No - Patient declined   No - Patient declined Yes (MAU/Ambulatory/Procedural Areas - Information given)     Current Medications (verified) Outpatient Encounter Medications as of 05/30/2022  Medication Sig   atorvastatin (LIPITOR) 10 MG tablet Take 1 tablet (10 mg total) by mouth daily.   Cholecalciferol (VITAMIN D3) 125 MCG (5000 UT) capsule Take 1 capsule (5,000 Units total) by mouth daily.   cinacalcet (SENSIPAR) 30 MG tablet TAKE 1 TABLET EVERY DAY WITH BREAKFAST   diclofenac Sodium (VOLTAREN) 1  % GEL Apply 4 g topically 4 (four) times daily.   linaclotide (LINZESS) 72 MCG capsule Take 1 capsule (72 mcg total) by mouth daily before breakfast.   losartan (COZAAR) 50 MG tablet Take 1 tablet (50 mg total) by mouth daily.   mirabegron ER (MYRBETRIQ) 25 MG TB24 tablet Take 1 tablet (25 mg total) by mouth daily.   PARoxetine (PAXIL) 10 MG tablet Take 1 tablet (10 mg total) by mouth daily.   polyethylene glycol powder (GLYCOLAX/MIRALAX) 17 GM/SCOOP powder Take 17 g by mouth daily as needed.   traZODone (DESYREL) 50 MG tablet TAKE 1/2 TABLET(25 MG) BY MOUTH AT BEDTIME   No facility-administered encounter medications on file as of 05/30/2022.    Allergies (verified) Patient has no known allergies.   History: Past Medical History:  Diagnosis Date   Arthritis    CKD (chronic kidney disease), stage III (HCC)    GERD (gastroesophageal reflux disease)    Gout    Hyperlipidemia    Hypertension    RBBB    Past Surgical History:  Procedure Laterality Date   COLONOSCOPY N/A 04/19/2016   Procedure: COLONOSCOPY;  Surgeon: West Bali, MD;  Location: [facility];  Service: Endoscopy;  Laterality: N/A;  1130    NO PAST SURGERIES     Family History  Problem Relation Age of Onset   Cancer Father    Dementia Mother    Social  History   Socioeconomic History   Marital status: Widowed    Spouse name: Not on file   Number of children: 5   Years of education: 57   Highest education level: 12th grade  Occupational History   Not on file  Tobacco Use   Smoking status: Former    Types: Cigarettes    Quit date: 01/17/1995    Years since quitting: 27.3    Passive exposure: Past   Smokeless tobacco: Former    Quit date: 01/28/1991  Vaping Use   Vaping Use: Never used  Substance and Sexual Activity   Alcohol use: No   Drug use: No   Sexual activity: Not Currently  Other Topics Concern   Not on file  Social History Narrative   5 children, 3 children living, 12 grandchildren, 15 great  grandchildren.   Wife died in 06/21/06.   Social Determinants of Health   Financial Resource Strain: Low Risk  (05/30/2022)   Overall Financial Resource Strain (CARDIA)    Difficulty of Paying Living Expenses: Not hard at all  Food Insecurity: No Food Insecurity (05/30/2022)   Hunger Vital Sign    Worried About Running Out of Food in the Last Year: Never true    Ran Out of Food in the Last Year: Never true  Transportation Needs: No Transportation Needs (05/30/2022)   PRAPARE - Administrator, Civil Service (Medical): No    Lack of Transportation (Non-Medical): No  Physical Activity: Inactive (05/30/2022)   Exercise Vital Sign    Days of Exercise per Week: 0 days    Minutes of Exercise per Session: 0 min  Stress: No Stress Concern Present (05/30/2022)   [employer] of Occupational Health - Occupational Stress Questionnaire    Feeling of Stress : Not at all  Social Connections: Moderately Integrated (04/01/2021)   Social Connection and Isolation Panel [NHANES]    Frequency of Communication with Friends and Family: More than three times a week    Frequency of Social Gatherings with Friends and Family: More than three times a week    Attends Religious Services: More than 4 times per year    Active Member of [employer] or Organizations: Yes    Attends Banker Meetings: More than 4 times per year    Marital Status: Widowed    Tobacco Counseling Counseling given: Not Answered   Clinical Intake:  Pre-visit preparation completed: Yes  Pain : No/denies pain     Nutritional Status: BMI 25 -29 Overweight Nutritional Risks: None Diabetes: No  How often do you need to have someone help you when you read instructions, pamphlets, or other written materials from your doctor or pharmacy?: 1 - Never  Diabetic? no  Interpreter Needed?: No  Information entered by :: NAllen LPN   Activities of Daily Living    05/30/2022   11:35 AM  In your present state of  health, do you have any difficulty performing the following activities:  Hearing? 0  Vision? 0  Difficulty concentrating or making decisions? 1  Walking or climbing stairs? 1  Dressing or bathing? 0  Doing errands, shopping? 1  Preparing Food and eating ? N  Using the Toilet? N  In the past six months, have you accidently leaked urine? Y  Do you have problems with loss of bowel control? N  Managing your Medications? Y  Managing your Finances? Y  Housekeeping or managing your Housekeeping? N    Patient Care Team: Jac Canavan,  PA-C as PCP - General (Family Medicine)  Indicate any recent Medical Services you may have received from other than [facility] providers in the past year (date may be approximate).     Assessment:   This is a routine wellness examination for Scott Burton.  Hearing/Vision screen Vision Screening - Comments:: Regular eye exams, [facility]  Dietary issues and exercise activities discussed: Current Exercise Habits: The patient does not participate in regular exercise at present   Goals Addressed             This Visit's Progress    Patient Stated       05/30/2022, no goals       Depression Screen    05/30/2022   11:34 AM 04/05/2022   10:04 AM 02/14/2022   12:58 PM 02/14/2022   11:49 AM 07/28/2021   10:57 AM 04/01/2021    2:16 PM 03/07/2021    2:21 PM  PHQ 2/9 Scores  PHQ - 2 Score 0 0 5 0 0 2 5  PHQ- 9 Score 1  12   5 8     Fall Risk    05/30/2022   11:34 AM 04/05/2022   10:04 AM 02/14/2022   11:48 AM 07/28/2021   10:57 AM 04/01/2021    2:19 PM  Fall Risk   Falls in the past year? 0 0 0 0 1  Number falls in past yr: 0 0 0 0 1  Injury with Fall? 0 0 0 0 0  Risk for fall due to : Medication side effect No Fall Risks No Fall Risks No Fall Risks History of fall(s);Impaired balance/gait;Impaired mobility;Mental status change  Follow up Falls prevention discussed;Education provided;Falls evaluation completed Falls evaluation completed Falls evaluation  completed Falls evaluation completed Falls evaluation completed;Education provided;Falls prevention discussed    FALL RISK PREVENTION PERTAINING TO THE HOME:  Any stairs in or around the home? Yes  If so, are there any without handrails? No  Home free of loose throw rugs in walkways, pet beds, electrical cords, etc? Yes  Adequate lighting in your home to reduce risk of falls? Yes   ASSISTIVE DEVICES UTILIZED TO PREVENT FALLS:  Life alert? No  Use of a cane, walker or w/c? No  Grab bars in the bathroom? Yes  Shower chair or bench in shower? No  Elevated toilet seat or a handicapped toilet? No   TIMED UP AND GO:  Was the test performed? No .      Cognitive Function:    03/18/2020    9:42 AM  MMSE - Mini Mental State Exam  Orientation to time 5  Orientation to Place 5  Registration 1  Attention/ Calculation 0  Recall 2  Language- name 2 objects 2  Language- repeat 0  Language- follow 3 step command 2  Language- read & follow direction 1  Write a sentence 0  Copy design 0  Total score 18        05/30/2022   11:37 AM 10/15/2020   12:18 PM  6CIT Screen  What Year? 0 points 0 points  What month? 0 points 0 points  What time? 3 points 0 points  Count back from 20 4 points 0 points  Months in reverse 0 points 4 points  Repeat phrase 10 points 8 points  Total Score 17 points 12 points    Immunizations Immunization History  Administered Date(s) Administered   Fluad Quad(high Dose 65+) 11/25/2019   Influenza Inj Mdck Quad Pf 10/22/2018   Influenza, High  Dose Seasonal PF 01/15/2017, 12/04/2017   Influenza,inj,Quad PF,6+ Mos 09/25/2014, 09/22/2015   Moderna Sars-Covid-2 Vaccination 05/08/2019, 06/05/2019   Zoster, Live 02/04/2014    TDAP status: Up to date  Flu Vaccine status: Declined, Education has been provided regarding the importance of this vaccine but patient still declined. Advised may receive this vaccine at local pharmacy or Health Dept. Aware to provide  a copy of the vaccination record if obtained from local pharmacy or Health Dept. Verbalized acceptance and understanding.  Pneumococcal vaccine status: Declined,  Education has been provided regarding the importance of this vaccine but patient still declined. Advised may receive this vaccine at local pharmacy or Health Dept. Aware to provide a copy of the vaccination record if obtained from local pharmacy or Health Dept. Verbalized acceptance and understanding.   Covid-19 vaccine status: Completed vaccines  Qualifies for Shingles Vaccine? Yes   Zostavax completed Yes   Shingrix Completed?: No.    Education has been provided regarding the importance of this vaccine. Patient has been advised to call insurance company to determine out of pocket expense if they have not yet received this vaccine. Advised may also receive vaccine at local pharmacy or Health Dept. Verbalized acceptance and understanding.  Screening Tests Health Maintenance  Topic Date Due   Zoster Vaccines- Shingrix (1 of 2) Never done   COVID-19 Vaccine (3 - Moderna risk series) 07/03/2019   Medicare Annual Wellness (AWV)  10/15/2021   Pneumonia Vaccine 53+ Years old (1 of 1 - PCV) 02/15/2023 (Originally 10/04/2002)   INFLUENZA VACCINE  08/17/2022   HPV VACCINES  Aged Out   DTaP/Tdap/Td  Discontinued    Health Maintenance  Health Maintenance Due  Topic Date Due   Zoster Vaccines- Shingrix (1 of 2) Never done   COVID-19 Vaccine (3 - Moderna risk series) 07/03/2019   Medicare Annual Wellness (AWV)  10/15/2021    Colorectal cancer screening: No longer required.   Lung Cancer Screening: (Low Dose CT Chest recommended if Age 71-80 years, 30 pack-year currently smoking OR have quit w/in 15years.) does not qualify.   Lung Cancer Screening Referral: no  Additional Screening:  Hepatitis C Screening: does not qualify;   Vision Screening: Recommended annual ophthalmology exams for early detection of glaucoma and other  disorders of the eye. Is the patient up to date with their annual eye exam?  Yes  Who is the provider or what is the name of the office in which the patient attends annual eye exams? [facility] If pt is not established with a provider, would they like to be referred to a provider to establish care? No .   Dental Screening: Recommended annual dental exams for proper oral hygiene  Community Resource Referral / Chronic Care Management: CRR required this visit?  No   CCM required this visit?  No      Plan:     I have personally reviewed and noted the following in the patient's chart:   Medical and social history Use of alcohol, tobacco or illicit drugs  Current medications and supplements including opioid prescriptions. Patient is not currently taking opioid prescriptions. Functional ability and status Nutritional status Physical activity Advanced directives List of other physicians Hospitalizations, surgeries, and ER visits in previous 12 months Vitals Screenings to include cognitive, depression, and falls Referrals and appointments  In addition, I have reviewed and discussed with patient certain preventive protocols, quality metrics, and best practice recommendations. A written personalized care plan for preventive services as well as general  preventive health recommendations were provided to patient.     Barb Merino, LPN   1/61/0960   Nurse Notes: none  Due to this being a virtual visit, the after visit summary with patients personalized plan was offered to patient via mail or my-chart. Patient would like to access on my-chart

## 2022-05-31 ENCOUNTER — Other Ambulatory Visit: Payer: Self-pay | Admitting: Medical

## 2022-05-31 MED ORDER — LINACLOTIDE 72 MCG PO CAPS: 72.0000 ug | ORAL_CAPSULE | Freq: Every day | ORAL | 1 refills | Status: DC

## 2022-06-24 DIAGNOSIS — R2689 Other abnormalities of gait and mobility: Secondary | ICD-10-CM | POA: Diagnosis not present

## 2022-06-24 DIAGNOSIS — R262 Difficulty in walking, not elsewhere classified: Secondary | ICD-10-CM | POA: Diagnosis not present

## 2022-06-24 DIAGNOSIS — Z9181 History of falling: Secondary | ICD-10-CM | POA: Diagnosis not present

## 2022-06-24 DIAGNOSIS — M6281 Muscle weakness (generalized): Secondary | ICD-10-CM | POA: Diagnosis not present

## 2022-07-21 ENCOUNTER — Other Ambulatory Visit: Payer: Self-pay | Admitting: Medical

## 2022-07-24 DIAGNOSIS — M6281 Muscle weakness (generalized): Secondary | ICD-10-CM | POA: Diagnosis not present

## 2022-07-24 DIAGNOSIS — R262 Difficulty in walking, not elsewhere classified: Secondary | ICD-10-CM | POA: Diagnosis not present

## 2022-07-24 DIAGNOSIS — R2689 Other abnormalities of gait and mobility: Secondary | ICD-10-CM | POA: Diagnosis not present

## 2022-07-24 DIAGNOSIS — Z9181 History of falling: Secondary | ICD-10-CM | POA: Diagnosis not present

## 2022-08-24 DIAGNOSIS — Z9181 History of falling: Secondary | ICD-10-CM | POA: Diagnosis not present

## 2022-08-24 DIAGNOSIS — R262 Difficulty in walking, not elsewhere classified: Secondary | ICD-10-CM | POA: Diagnosis not present

## 2022-08-24 DIAGNOSIS — M6281 Muscle weakness (generalized): Secondary | ICD-10-CM | POA: Diagnosis not present

## 2022-08-24 DIAGNOSIS — R2689 Other abnormalities of gait and mobility: Secondary | ICD-10-CM | POA: Diagnosis not present

## 2022-09-07 DIAGNOSIS — E212 Other hyperparathyroidism: Secondary | ICD-10-CM | POA: Diagnosis not present

## 2022-09-08 LAB — PTH, INTACT AND CALCIUM: PTH: 101 pg/mL — ABNORMAL HIGH (ref 15–65)

## 2022-09-11 ENCOUNTER — Ambulatory Visit (INDEPENDENT_AMBULATORY_CARE_PROVIDER_SITE_OTHER): Payer: Medicare HMO | Admitting: "Endocrinology

## 2022-09-11 ENCOUNTER — Encounter: Payer: Self-pay | Admitting: "Endocrinology

## 2022-09-11 VITALS — BP 120/56 | HR 88 | Ht 66.0 in | Wt 193.0 lb

## 2022-09-11 DIAGNOSIS — M8580 Other specified disorders of bone density and structure, unspecified site: Secondary | ICD-10-CM | POA: Diagnosis not present

## 2022-09-11 DIAGNOSIS — E212 Other hyperparathyroidism: Secondary | ICD-10-CM

## 2022-09-11 DIAGNOSIS — E559 Vitamin D deficiency, unspecified: Secondary | ICD-10-CM

## 2022-09-11 MED ORDER — CINACALCET HCL 30 MG PO TABS
ORAL_TABLET | ORAL | 3 refills | Status: DC
Start: 2022-09-11 — End: 2023-01-08

## 2022-09-11 NOTE — Progress Notes (Signed)
09/11/2022, 1:33 PM  Endocrinology follow-up note  Scott Burton is a 87 y.o.-year-old male, returning for follow-up after he was seen in consultation for hypercalcemia/hyperparathyroidism.  He was initially referred by his PCP Tysinger, Kermit Balo, PA-C .  He is accompanied by his daughter to the clinic today.  He is currently on Sensipar 30 mg p.o. daily at breakfast.  His previsit labs show calcium controlled at 10.1 with PTH 101.    Past Medical History:  Diagnosis Date   Arthritis    CKD (chronic kidney disease), stage III (HCC)    GERD (gastroesophageal reflux disease)    Gout    Hyperlipidemia    Hypertension    RBBB     Past Surgical History:  Procedure Laterality Date   COLONOSCOPY N/A 04/19/2016   Procedure: COLONOSCOPY;  Surgeon: West Bali, MD;  Location: [facility];  Service: Endoscopy;  Laterality: N/A;  1130    NO PAST SURGERIES      Social History   Tobacco Use   Smoking status: Former    Current packs/day: 0.00    Types: Cigarettes    Quit date: 01/17/1995    Years since quitting: 27.6    Passive exposure: Past   Smokeless tobacco: Former    Quit date: 01/28/1991  Vaping Use   Vaping status: Never Used  Substance Use Topics   Alcohol use: No   Drug use: No    Family History  Problem Relation Age of Onset   Cancer Father    Dementia Mother     Outpatient Encounter Medications as of 09/11/2022  Medication Sig   atorvastatin (LIPITOR) 10 MG tablet Take 1 tablet (10 mg total) by mouth daily.   Cholecalciferol (VITAMIN D3) 125 MCG (5000 UT) capsule Take 1 capsule (5,000 Units total) by mouth daily.   cinacalcet (SENSIPAR) 30 MG tablet TAKE 1 TABLET EVERY DAY WITH BREAKFAST   diclofenac Sodium (VOLTAREN) 1 % GEL Apply 4 g topically 4 (four) times daily.   linaclotide (LINZESS) 72 MCG capsule Take 1 capsule (72 mcg total) by mouth daily before breakfast.   losartan (COZAAR) 50 MG tablet TAKE 1 TABLET EVERY DAY   mirabegron ER (MYRBETRIQ)  25 MG TB24 tablet Take 1 tablet (25 mg total) by mouth daily.   PARoxetine (PAXIL) 10 MG tablet Take 1 tablet (10 mg total) by mouth daily.   polyethylene glycol powder (GLYCOLAX/MIRALAX) 17 GM/SCOOP powder Take 17 g by mouth daily as needed.   traZODone (DESYREL) 50 MG tablet TAKE 1/2 TABLET(25 MG) BY MOUTH AT BEDTIME   [DISCONTINUED] cinacalcet (SENSIPAR) 30 MG tablet TAKE 1 TABLET EVERY DAY WITH BREAKFAST   No facility-administered encounter medications on file as of 09/11/2022.    No Known Allergies   HPI  Scott Burton was diagnosed with hypercalcemia at least since 2018.  Patient is a poor historian, with cognitive deficit, accompanied by his daughter.  -He is tolerating his Sensipar 30 mg p.o. daily at breakfast.   He has no new complaints today.  -He is not a surgical candidate for hyperparathyroidism.  -He is not taking any calcium supplements. His bone density study is consistent with osteopenia.  No prior history of fragility fractures or falls. No history of  kidney stones.  he is not on HCTZ or other thiazide therapy.  He has vitamin D deficiency and supplement with cholecalciferol 1000 units daily.   -  he eats dairy and green, leafy, vegetables  on average amounts.  he does not have a family history of hypercalcemia, pituitary tumors, thyroid cancer, or osteoporosis.    ROS: Limited as above. PE: BP (!) 120/56   Pulse 88   Ht 5\' 6"  (1.676 m)   Wt 193 lb (87.5 kg)   BMI 31.15 kg/m , Body mass index is 31.15 kg/m. Wt Readings from Last 3 Encounters:  09/11/22 193 lb (87.5 kg)  05/30/22 185 lb (83.9 kg)  04/05/22 193 lb 12.8 oz (87.9 kg)       CMP ( most recent) CMP     Component Value Date/Time   NA 142 02/14/2022 1244   K 5.2 02/14/2022 1244   CL 104 02/14/2022 1244   CO2 23 02/14/2022 1244   GLUCOSE 92 02/14/2022 1244   GLUCOSE 85 08/11/2020 0904   BUN 14 02/14/2022 1244   CREATININE 1.71 (H) 02/14/2022 1244   CREATININE 1.66 (H) 08/11/2020  0904   CALCIUM 10.2 09/07/2022 1012   PROT 6.9 08/26/2021 1630   ALBUMIN 4.3 08/26/2021 1630   AST 17 08/26/2021 1630   ALT 12 08/26/2021 1630   ALKPHOS 79 08/26/2021 1630   BILITOT 0.7 08/26/2021 1630   GFRNONAA 34 (L) 07/22/2020 1144   GFRAA 40 (L) 07/22/2020 1144     Lipid Panel ( most recent) Lipid Panel     Component Value Date/Time   CHOL 162 08/26/2021 1630   TRIG 141 08/26/2021 1630   HDL 37 (L) 08/26/2021 1630   CHOLHDL 4.4 08/26/2021 1630   CHOLHDL 4.3 03/18/2020 1021   VLDL 43 (H) 09/07/2016 0801   LDLCALC 100 (H) 08/26/2021 1630   LDLCALC 100 (H) 03/18/2020 1021   LDLDIRECT 130 (H) 11/05/2018 1159   LABVLDL 25 08/26/2021 1630      Lab Results  Component Value Date   TSH 2.330 02/14/2022   TSH 2.510 10/29/2019   TSH 2.02 07/23/2019   TSH 1.89 12/04/2017   TSH 1.40 04/26/2015   FREET4 1.22 10/29/2019    November 13, 2019 bone density results: DualFemur Neck Left 11/05/2019 82.0 N/A -2.2 0.786 g/cm2   Left Forearm Radius 33% 11/05/2019 82.0 N/A -0.3 0.782 g/cm2 ASSESSMENT: BMD as determined from Femur Neck Left is 0.786 g/cm2 with a T-score of -2.2. This patient is considered osteopenic by World Healh Organization (WHO) Criteria. Lumbar spine was excluded due to advanced degenerative changes.    Assessment: 1. Hypercalcemia / Hyperparathyroidism 2.  Osteopenia 3.  Vitamin D deficiency  Plan: His presentation has been consistent with mild, primary hyperparathyroidism.  He is responding to low-dose Sensipar.   He has stage 3-4 CKD.  He is not a surgical candidate for hyperparathyroidism.  He is advised to continue  Sensipar  30 mg p.o. every  day with breakfast with plan to repeat PTH/calcium and office visit in 6 months.  Side effects and precautions discussed with him.      He has CKD and osteopenia.  He is advised to maintain close follow-up with the nephrologist, PMD.   Regarding his vitamin D deficiency: He is advised to continue vitamin D  supplements.   He is advised to continue close follow-up with his PMD .   I spent  21  minutes in the care of the patient today including review of labs from Thyroid Function, CMP, and other relevant labs ; imaging/biopsy records (current and previous including abstractions from other facilities); face-to-face time discussing  his lab results and symptoms, medications doses, his options of short and long term  treatment based on the latest standards of care / guidelines;   and documenting the encounter.  Chryl Heck  participated in the discussions, expressed understanding, and voiced agreement with the above plans.  All questions were answered to his satisfaction. he is encouraged to contact clinic should he have any questions or concerns prior to his return visit.   - Return in about 6 months (around 03/14/2023) for F/U with Pre-visit Labs.   Marquis Lunch, MD [facility] [address] [place], [place] [address] Phone: [phone]  Fax: [phone]    This note was partially dictated with voice recognition software. Similar sounding words can be transcribed inadequately or may not  be corrected upon review.  09/11/2022, 1:33 PM

## 2022-09-25 ENCOUNTER — Other Ambulatory Visit: Payer: Self-pay | Admitting: Medical

## 2022-09-25 DIAGNOSIS — F32A Depression, unspecified: Secondary | ICD-10-CM

## 2022-11-03 ENCOUNTER — Other Ambulatory Visit: Payer: Self-pay | Admitting: Medical

## 2022-12-06 ENCOUNTER — Other Ambulatory Visit: Payer: Self-pay | Admitting: Medical

## 2022-12-11 ENCOUNTER — Ambulatory Visit (INDEPENDENT_AMBULATORY_CARE_PROVIDER_SITE_OTHER): Payer: Medicare HMO | Admitting: Medical

## 2022-12-11 VITALS — BP 120/70 | HR 67 | Wt 186.6 lb

## 2022-12-11 DIAGNOSIS — N401 Enlarged prostate with lower urinary tract symptoms: Secondary | ICD-10-CM

## 2022-12-11 DIAGNOSIS — R3911 Hesitancy of micturition: Secondary | ICD-10-CM

## 2022-12-11 DIAGNOSIS — Z23 Encounter for immunization: Secondary | ICD-10-CM | POA: Diagnosis not present

## 2022-12-11 DIAGNOSIS — R34 Anuria and oliguria: Secondary | ICD-10-CM | POA: Diagnosis not present

## 2022-12-11 LAB — POCT URINALYSIS DIP (PROADVANTAGE DEVICE)
Bilirubin, UA: NEGATIVE
Blood, UA: NEGATIVE
Glucose, UA: NEGATIVE mg/dL
Leukocytes, UA: NEGATIVE
Nitrite, UA: NEGATIVE
Specific Gravity, Urine: 1.02
Urobilinogen, Ur: NEGATIVE
pH, UA: 6 (ref 5.0–8.0)

## 2022-12-11 MED ORDER — TAMSULOSIN HCL 0.4 MG PO CAPS: 0.4000 mg | ORAL_CAPSULE | Freq: Every day | ORAL | 0 refills | Status: DC

## 2022-12-11 NOTE — Progress Notes (Signed)
Subjective:  Scott Burton is a 87 y.o. male who presents for Chief Complaint  Patient presents with   trouble with urination    Trouble urinating x 1 month, urinates alittle sometimes and then urinates alot     Here today with his daughter [name] and on the phone is granddaughter [name].  Another family member lives with him and helps manage his medications.  They are not here today in this conversation.  For the last month he has had problems with urinating.  He has trouble getting the urine to start, weaker urine stream, some dribbling at the end, hesitancy.  No blood in urine, no burning with urination.  No cloudy urine.  No belly pain or back pain.  He has been on Myrbetriq for numerous months for initially having issues with overactive bladder.  He does not have his pill bottles with him today but the family notes that he has a week pack that they portion out his medicines into.  He uses Linzess or MiraLAX for constipation, uses these on average every other day.  Bowels have been okay lately  He uses regular underwear.  No adult diapers.  Towards the end of the visit the other concerns about memory  No other aggravating or relieving factors.    No other c/o.  Past Medical History:  Diagnosis Date   Arthritis    CKD (chronic kidney disease), stage III (HCC)    GERD (gastroesophageal reflux disease)    Gout    Hyperlipidemia    Hypertension    RBBB    Current Outpatient Medications on File Prior to Visit  Medication Sig Dispense Refill   atorvastatin (LIPITOR) 10 MG tablet TAKE 1 TABLET EVERY DAY 90 tablet 1   Cholecalciferol (VITAMIN D3) 125 MCG (5000 UT) CAPS TAKE 1 CAPSULE EVERY DAY 90 capsule 1   diclofenac Sodium (VOLTAREN) 1 % GEL Apply 4 g topically 4 (four) times daily. 150 g 2   linaclotide (LINZESS) 72 MCG capsule TAKE 1 CAPSULE EVERY DAY BEFORE BREAKFAST 90 capsule 0   losartan (COZAAR) 50 MG tablet TAKE 1 TABLET EVERY DAY 90 tablet 0   mirabegron ER  (MYRBETRIQ) 25 MG TB24 tablet TAKE 1 TABLET EVERY DAY FOR BLADDER 90 tablet 1   PARoxetine (PAXIL) 10 MG tablet Take 1 tablet (10 mg total) by mouth daily. 30 tablet 1   polyethylene glycol powder (GLYCOLAX/MIRALAX) 17 GM/SCOOP powder Take 17 g by mouth daily as needed. 3350 g 1   traZODone (DESYREL) 50 MG tablet TAKE 1/2 TABLET(25 MG) BY MOUTH AT BEDTIME 45 tablet 3   cinacalcet (SENSIPAR) 30 MG tablet TAKE 1 TABLET EVERY DAY WITH BREAKFAST 90 tablet 3   No current facility-administered medications on file prior to visit.     The following portions of the patient's history were reviewed and updated as appropriate: allergies, current medications, past family history, past medical history, past social history, past surgical history and problem list.  ROS Otherwise as in subjective above  Objective: BP 120/70   Pulse 67   Wt 186 lb 9.6 oz (84.6 kg)   BMI 30.12 kg/m   General appearance: alert, no distress, well developed, well nourished Abdomen: +bs, soft, non tender, non distended, no masses, no hepatomegaly, no splenomegaly Pulses: 2+ radial pulses, 2+ pedal pulses, normal cap refill Ext: no edema    Assessment: Encounter Diagnoses  Name Primary?   Benign prostatic hyperplasia with urinary hesitancy Yes   Decreased urination    Needs  flu shot      Plan: We discussed symptoms and concerns.  Symptoms could be related to prostate enlargement or medication adverse effect or other  Recommendations: Temporarily STOP Myrbetriq/Mirabegron ER 25mg  daily for the next 5-7 days to see if that helps at all If stopping Mybetriq helps and resolves the symptoms then continue OFF Mybetriq and let me know in a week If no improvement and worse urinary frequently, then begin back on Myrbetriq but ADD Flomax below Flomax was sent today to begin IF stopping Myrbetriq doesn't help.  Flomax relaxes the smooth muscle of the prostate to help with urinary symptoms    Counseled on the  influenza virus vaccine.  Vaccine information sheet given.  Influenza vaccine given after consent obtained.  They brought up memory concerns towards end of visit.  We were not a lot of time to fully delve into this.  Advise he go ahead and get on the schedule to come back for this specifically and to get on the schedule for physical soon.    Ory was seen today for trouble with urination.  Diagnoses and all orders for this visit:  Benign prostatic hyperplasia with urinary hesitancy  Decreased urination -     POCT Urinalysis DIP (Proadvantage Device)  Needs flu shot -     Flu Vaccine Trivalent High Dose (Fluad)  Other orders -     tamsulosin (FLOMAX) 0.4 MG CAPS capsule; Take 1 capsule (0.4 mg total) by mouth daily.    Follow up: 1wk

## 2022-12-11 NOTE — Patient Instructions (Signed)
Symptoms could be related to prostate enlargement or medication adverse effect or other  Recommendations: Temporarily STOP Myrbetriq/Mirabegron ER 25mg  daily for the next 5-7 days to see if that helps at all If stopping Mybetriq helps and resolves the symptoms then continue OFF Mybetria and let me know in a week If no improvement and worse urinary frequently, then begin back on Myrbetriq but ADD Flomax below Flomax was sent today to begin IF stopping Myrbetriq doesn't help.  Flomax relaxes the smooth muscle of the prostate to help with urinary symptoms

## 2022-12-21 ENCOUNTER — Other Ambulatory Visit: Payer: Self-pay | Admitting: Medical

## 2022-12-21 DIAGNOSIS — N3281 Overactive bladder: Secondary | ICD-10-CM

## 2022-12-21 DIAGNOSIS — N401 Enlarged prostate with lower urinary tract symptoms: Secondary | ICD-10-CM

## 2022-12-27 MED ORDER — TAMSULOSIN HCL 0.4 MG PO CAPS: 0.4000 mg | ORAL_CAPSULE | Freq: Every day | ORAL | 0 refills | Status: DC

## 2023-01-06 ENCOUNTER — Other Ambulatory Visit: Payer: Self-pay | Admitting: "Endocrinology

## 2023-01-11 ENCOUNTER — Other Ambulatory Visit: Payer: Self-pay | Admitting: Medical

## 2023-01-16 ENCOUNTER — Ambulatory Visit: Payer: Medicare HMO | Admitting: Medical

## 2023-01-19 ENCOUNTER — Ambulatory Visit: Payer: Medicare HMO | Admitting: Medical

## 2023-01-22 NOTE — Progress Notes (Signed)
 Name: Scott Burton DOB: [birth year] MRN: [id number]  History of Present Illness: Scott Burton is a 88 y.o. male who presents today as a new patient at [facility]. All available relevant medical records have been reviewed. He is accompanied by his granddaughter Scott Burton, who assists with providing history due to patient's mild cognitive impairment. - GU History: 1. BPH with LUTS (weak stream, hesitancy, terminal dribbling). - 09/07/2016: PSA was normal (3.4). - Taking Myrbetriq  25 mg daily and Flomax  0.4 mg daily.   Today: He reports chief complaint of slow weak urinary stream and sensations of incomplete bladder emptying. He denies increased urinary urgency, frequency, incontinence, dysuria, gross hematuria, or straining to void. He denies flank pain or abdominal pain.   He started taking Flomax  0.4 mg daily around September 2024 and started Myrbetriq  25 mg daily around November 2024; he denies any change in his urinary stream or bladder emptying since starting either of those medications.    Fall Screening: Do you usually have a device to assist in your mobility? No   Medications: Current Outpatient Medications  Medication Sig Dispense Refill   atorvastatin  (LIPITOR) 10 MG tablet TAKE 1 TABLET EVERY DAY 90 tablet 1   Cholecalciferol  (VITAMIN D3) 125 MCG (5000 UT) CAPS TAKE 1 CAPSULE EVERY DAY 90 capsule 1   cinacalcet  (SENSIPAR ) 30 MG tablet TAKE 1 TABLET EVERY DAY WITH BREAKFAST 90 tablet 3   diclofenac  Sodium (VOLTAREN ) 1 % GEL Apply 4 g topically 4 (four) times daily. 150 g 2   linaclotide  (LINZESS ) 72 MCG capsule TAKE 1 CAPSULE EVERY DAY BEFORE BREAKFAST 90 capsule 0   losartan  (COZAAR ) 50 MG tablet TAKE 1 TABLET EVERY DAY 90 tablet 0   mirabegron  ER (MYRBETRIQ ) 25 MG TB24 tablet TAKE 1 TABLET EVERY DAY FOR BLADDER 90 tablet 1   PARoxetine  (PAXIL ) 10 MG tablet Take 1 tablet (10 mg total) by mouth daily. 30 tablet 1   polyethylene glycol powder  (GLYCOLAX /MIRALAX ) 17 GM/SCOOP powder Take 17 g by mouth daily as needed. 3350 g 1   tamsulosin  (FLOMAX ) 0.4 MG CAPS capsule Take 1 capsule (0.4 mg total) by mouth daily. 90 capsule 0   traZODone  (DESYREL ) 50 MG tablet TAKE 1/2 TABLET(25 MG) BY MOUTH AT BEDTIME 45 tablet 3   No current facility-administered medications for this visit.    Allergies: No Known Allergies  Past Medical History:  Diagnosis Date   Arthritis    CKD (chronic kidney disease), stage III (HCC)    GERD (gastroesophageal reflux disease)    Gout    Hyperlipidemia    Hypertension    RBBB    Past Surgical History:  Procedure Laterality Date   COLONOSCOPY N/A 04/19/2016   Procedure: COLONOSCOPY;  Surgeon: Margo LITTIE Haddock, MD;  Location: [facility];  Service: Endoscopy;  Laterality: N/A;  1130    NO PAST SURGERIES     Family History  Problem Relation Age of Onset   Cancer Father    Dementia Mother    Social History   Socioeconomic History   Marital status: Widowed    Spouse name: Not on file   Number of children: 5   Years of education: 76   Highest education level: 12th grade  Occupational History   Not on file  Tobacco Use   Smoking status: Former    Current packs/day: 0.00    Types: Cigarettes    Quit date: 01/17/1995    Years since quitting: 28.0    Passive  exposure: Past   Smokeless tobacco: Former    Quit date: 01/28/1991  Vaping Use   Vaping status: Never Used  Substance and Sexual Activity   Alcohol use: No   Drug use: No   Sexual activity: Not Currently  Other Topics Concern   Not on file  Social History Narrative   5 children, 3 children living, 12 grandchildren, 15 great grandchildren.   Wife died in 2007/01/20.   Social Drivers of Corporate Investment Banker Strain: Low Risk  (05/30/2022)   Overall Financial Resource Strain (CARDIA)    Difficulty of Paying Living Expenses: Not hard at all  Food Insecurity: No Food Insecurity (05/30/2022)   Hunger Vital Sign    Worried About Running  Out of Food in the Last Year: Never true    Ran Out of Food in the Last Year: Never true  Transportation Needs: No Transportation Needs (05/30/2022)   PRAPARE - Administrator, Civil Service (Medical): No    Lack of Transportation (Non-Medical): No  Physical Activity: Inactive (05/30/2022)   Exercise Vital Sign    Days of Exercise per Week: 0 days    Minutes of Exercise per Session: 0 min  Stress: No Stress Concern Present (05/30/2022)   [employer] of Occupational Health - Occupational Stress Questionnaire    Feeling of Stress : Not at all  Social Connections: Moderately Integrated (04/01/2021)   Social Connection and Isolation Panel [NHANES]    Frequency of Communication with Friends and Family: More than three times a week    Frequency of Social Gatherings with Friends and Family: More than three times a week    Attends Religious Services: More than 4 times per year    Active Member of [employer] or Organizations: Yes    Attends Banker Meetings: More than 4 times per year    Marital Status: Widowed  Intimate Partner Violence: Not At Risk (04/01/2021)   Humiliation, Afraid, Rape, and Kick questionnaire    Fear of Current or Ex-Partner: No    Emotionally Abused: No    Physically Abused: No    Sexually Abused: No    SUBJECTIVE  Review of Systems - Limited due to patient cognitive status Cardiovascular: Patient denies chest pain or syncope Respiratory: Patient denies shortness of breath Gastrointestinal: Patient reports that his chronic constipation is well managed on Linzess . Denies nausea, vomiting, or diarrhea. Hematologic/Lymphatic: Patient denies bleeding tendencies  GU: As per HPI.  OBJECTIVE Vitals:   01/26/23 1013  BP: 112/68  Pulse: 86  Temp: (!) 97.4 F (36.3 C)   There is no height or weight on file to calculate BMI.  Physical Examination Constitutional: No obvious distress; patient is non-toxic appearing  Cardiovascular: No visible  lower extremity edema.  Respiratory: The patient does not have audible wheezing/stridor; respirations do not appear labored  Gastrointestinal: Abdomen non-distended Musculoskeletal: Normal ROM of UEs  Skin: No obvious rashes/open sores  Neurologic: CN 2-12 grossly intact Psychiatric: Answered questions appropriately with pleasant affect; somewhat poor historian Hematologic/Lymphatic/Immunologic: No obvious bruises or sites of spontaneous bleeding  Urine microscopy: 6-10 WBC/hpf, 0-2 RBC/hpf, modrerate bacteria PVR: 33 ml  ASSESSMENT Benign prostatic hyperplasia with urinary hesitancy - Plan: Urinalysis, Routine w reflex microscopic, BLADDER SCAN AMB NON-IMAGING  We reviewed his situation in detail; somewhat poor historian. We discussed BPH and/or his chronic constipation as possible contributing factors for weak urinary stream. No acute findings today; asymptomatic for UTI and PVR is unremarkable. Discussed option to increase Flomax   to 2x/day; he reported being happy with his current symptomatic control and elected to continue current treatment regimen. We agreed to plan for follow up in 6 months or sooner if needed. Pt verbalized understanding and agreement. All questions were answered.  PLAN Advised the following: 1. Continue Myrbetriq  25 mg daily.  2. Continue Flomax  0.4 mg daily. 3. Return in about 6 months (around 07/26/2023) for UA, PVR, & f/u with Lauraine Oz NP.  Orders Placed This Encounter  Procedures   Urinalysis, Routine w reflex microscopic   BLADDER SCAN AMB NON-IMAGING    It has been explained that the patient is to follow regularly with their PCP in addition to all other providers involved in their care and to follow instructions provided by these respective offices. Patient advised to contact urology clinic if any urologic-pertaining questions, concerns, new symptoms or problems arise in the interim period.  There are no Patient Instructions on file for this  visit.  Electronically signed by:  Lauraine KYM Oz, MSN, FNP-C, CUNP 01/26/2023 11:01 AM

## 2023-01-26 ENCOUNTER — Encounter: Payer: Self-pay | Admitting: Urology

## 2023-01-26 ENCOUNTER — Ambulatory Visit (INDEPENDENT_AMBULATORY_CARE_PROVIDER_SITE_OTHER): Payer: Medicare HMO | Admitting: Urology

## 2023-01-26 VITALS — BP 112/68 | HR 86 | Temp 97.4°F

## 2023-01-26 DIAGNOSIS — R3911 Hesitancy of micturition: Secondary | ICD-10-CM

## 2023-01-26 DIAGNOSIS — N401 Enlarged prostate with lower urinary tract symptoms: Secondary | ICD-10-CM

## 2023-01-26 LAB — MICROSCOPIC EXAMINATION

## 2023-01-26 LAB — URINALYSIS, ROUTINE W REFLEX MICROSCOPIC
Bilirubin, UA: NEGATIVE
Glucose, UA: NEGATIVE
Ketones, UA: NEGATIVE
Nitrite, UA: NEGATIVE
RBC, UA: NEGATIVE
Specific Gravity, UA: 1.03 (ref 1.005–1.030)
Urobilinogen, Ur: 0.2 mg/dL (ref 0.2–1.0)
pH, UA: 6 (ref 5.0–7.5)

## 2023-01-26 NOTE — Progress Notes (Addendum)
 PVR =33

## 2023-02-21 DIAGNOSIS — H524 Presbyopia: Secondary | ICD-10-CM | POA: Diagnosis not present

## 2023-02-21 DIAGNOSIS — H02834 Dermatochalasis of left upper eyelid: Secondary | ICD-10-CM | POA: Diagnosis not present

## 2023-02-21 DIAGNOSIS — I10 Essential (primary) hypertension: Secondary | ICD-10-CM | POA: Diagnosis not present

## 2023-02-21 DIAGNOSIS — H35033 Hypertensive retinopathy, bilateral: Secondary | ICD-10-CM | POA: Diagnosis not present

## 2023-02-21 DIAGNOSIS — Z0100 Encounter for examination of eyes and vision without abnormal findings: Secondary | ICD-10-CM | POA: Diagnosis not present

## 2023-02-21 DIAGNOSIS — H02831 Dermatochalasis of right upper eyelid: Secondary | ICD-10-CM | POA: Diagnosis not present

## 2023-02-21 DIAGNOSIS — H25813 Combined forms of age-related cataract, bilateral: Secondary | ICD-10-CM | POA: Diagnosis not present

## 2023-02-22 ENCOUNTER — Other Ambulatory Visit: Payer: Self-pay | Admitting: Medical

## 2023-02-23 ENCOUNTER — Encounter (HOSPITAL_COMMUNITY): Payer: Self-pay | Admitting: *Deleted

## 2023-02-23 ENCOUNTER — Other Ambulatory Visit: Payer: Self-pay

## 2023-02-23 ENCOUNTER — Telehealth: Payer: Medicare HMO | Admitting: Emergency Medicine

## 2023-02-23 ENCOUNTER — Emergency Department (HOSPITAL_COMMUNITY)
Admission: EM | Admit: 2023-02-23 | Discharge: 2023-02-23 | Disposition: A | Discharge status: Home / Self Care | Payer: Medicare HMO | Attending: Emergency Medicine | Admitting: Emergency Medicine

## 2023-02-23 DIAGNOSIS — B3742 Candidal balanitis: Secondary | ICD-10-CM | POA: Diagnosis not present

## 2023-02-23 DIAGNOSIS — R1031 Right lower quadrant pain: Secondary | ICD-10-CM | POA: Diagnosis not present

## 2023-02-23 DIAGNOSIS — N4889 Other specified disorders of penis: Secondary | ICD-10-CM | POA: Diagnosis present

## 2023-02-23 DIAGNOSIS — Z79899 Other long term (current) drug therapy: Secondary | ICD-10-CM | POA: Diagnosis not present

## 2023-02-23 DIAGNOSIS — I129 Hypertensive chronic kidney disease with stage 1 through stage 4 chronic kidney disease, or unspecified chronic kidney disease: Secondary | ICD-10-CM | POA: Insufficient documentation

## 2023-02-23 DIAGNOSIS — N189 Chronic kidney disease, unspecified: Secondary | ICD-10-CM | POA: Diagnosis not present

## 2023-02-23 DIAGNOSIS — N399 Disorder of urinary system, unspecified: Secondary | ICD-10-CM

## 2023-02-23 LAB — URINALYSIS, ROUTINE W REFLEX MICROSCOPIC
Bilirubin Urine: NEGATIVE
Glucose, UA: NEGATIVE mg/dL
Hgb urine dipstick: NEGATIVE
Ketones, ur: NEGATIVE mg/dL
Nitrite: NEGATIVE
Protein, ur: NEGATIVE mg/dL
Specific Gravity, Urine: 1.013 (ref 1.005–1.030)
pH: 6 (ref 5.0–8.0)

## 2023-02-23 MED ORDER — CLOTRIMAZOLE 1 % EX CREA: TOPICAL_CREAM | CUTANEOUS | 0 refills | Status: DC

## 2023-02-23 NOTE — Discharge Instructions (Addendum)
 Thank you for letting us  evaluate you today. No UTI. I think right sided pain is from nonemergent hernia. I believe you have a small yeast infection at the head of your penis due to nonretractable foreskin. Please make sure to clean area as much as possible. Return to ED if you experience swelling of testicles, penis, discharge from penis, testicular pain, inability to urinate, swelling of foreskin around penis (red, swelling, painsul, unable to pee)  Please pick up antifungal cream and use at head of penis  F/u with PCP

## 2023-02-23 NOTE — ED Provider Notes (Signed)
 [place] [facility]ERGENCY DE[facility]MENT AT [facility] Provider Note   CSN: [id number] Arrival date & time: 02/23/23  1143     History  Chief Complaint  Patient presents with   Groin Pain    Scott Burton is a 88 y.o. male with past medical history of hypertension, HLD, CKD, BPH with weak stream, hesitancy, dribbling presents to ED for intermittent right inguinal pain for past 2-3 weeks. He also complains of penile pain at head of penis. He denies testicular pain, testicular swelling, fevers, concern for STD.  He has been seen recently by urology for weak stream, hesitancy, dribbling and placed him on flomax  and myrbetriq . He states that stream has not improved. Has Uro f/u in 77mo.   F/U with PCP on 02/28/23   Groin Pain Pertinent negatives include no chest pain, no abdominal pain, no headaches and no shortness of breath.     Home Medications Prior to Admission medications   Medication Sig Start Date End Date Taking? Authorizing Provider  clotrimazole  (LOTRIMIN ) 1 % cream Apply to affected area 2 times daily 02/23/23  Yes Minnie Tinnie BRAVO, PA  atorvastatin  (LIPITOR) 10 MG tablet TAKE 1 TABLET EVERY DAY 09/25/22   Tysinger, Alm RAMAN, PA-C  Cholecalciferol  (VITAMIN D3) 125 MCG (5000 UT) CAPS TAKE 1 CAPSULE EVERY DAY 09/25/22   Tysinger, Alm RAMAN, PA-C  cinacalcet  (SENSIPAR ) 30 MG tablet TAKE 1 TABLET EVERY DAY WITH BREAKFAST 01/08/23   Nida, Gebreselassie W, MD  diclofenac  Sodium (VOLTAREN ) 1 % GEL Apply 4 g topically 4 (four) times daily. 04/05/22   Tysinger, Alm RAMAN, PA-C  linaclotide  (LINZESS ) 72 MCG capsule TAKE 1 CAPSULE EVERY DAY BEFORE BREAKFAST 02/22/23   Tysinger, Alm RAMAN, PA-C  losartan  (COZAAR ) 50 MG tablet TAKE 1 TABLET EVERY DAY 12/07/22   Tysinger, Alm RAMAN, PA-C  mirabegron  ER (MYRBETRIQ ) 25 MG TB24 tablet TAKE 1 TABLET EVERY DAY FOR BLADDER 09/25/22   Tysinger, Alm RAMAN, PA-C  PARoxetine  (PAXIL ) 10 MG tablet Take 1 tablet (10 mg total) by mouth daily. 02/14/22   Tysinger, Alm RAMAN, PA-C  polyethylene glycol powder (GLYCOLAX /MIRALAX ) 17 GM/SCOOP powder Take 17 g by mouth daily as needed. 07/28/21   Paseda, Folashade R, FNP  tamsulosin  (FLOMAX ) 0.4 MG CAPS capsule Take 1 capsule (0.4 mg total) by mouth daily. 12/27/22   Tysinger, Alm RAMAN, PA-C  traZODone  (DESYREL ) 50 MG tablet TAKE 1/2 TABLET(25 MG) BY MOUTH AT BEDTIME 04/05/22   Tysinger, Alm RAMAN, PA-C      Allergies    Patient has no known allergies.    Review of Systems   Review of Systems  Constitutional:  Negative for chills, fatigue and fever.  Respiratory:  Negative for cough, chest tightness, shortness of breath and wheezing.   Cardiovascular:  Negative for chest pain and palpitations.  Gastrointestinal:  Negative for abdominal pain, constipation, diarrhea, nausea and vomiting.  Genitourinary:  Positive for penile pain. Negative for penile swelling, scrotal swelling and testicular pain.  Neurological:  Negative for dizziness, seizures, weakness, light-headedness, numbness and headaches.    Physical Exam Updated Vital Signs BP 139/76 (BP Location: Right Arm)   Pulse 89   Temp 98 F (36.7 C)   Resp 18   Ht 5' 6 (1.676 m)   Wt 84.6 kg   SpO2 92%   BMI 30.10 kg/m  Physical Exam Vitals and nursing note reviewed. Exam conducted with a chaperone present.  Constitutional:      General: He is not in acute distress.  Appearance: Normal appearance. He is not ill-appearing.  HENT:     Head: Normocephalic and atraumatic.  Eyes:     Conjunctiva/sclera: Conjunctivae normal.  Cardiovascular:     Rate and Rhythm: Normal rate.  Pulmonary:     Effort: Pulmonary effort is normal. No respiratory distress.     Breath sounds: Normal breath sounds.  Chest:     Chest wall: No tenderness.  Abdominal:     Palpations: Abdomen is soft.     Tenderness: There is no abdominal tenderness. There is no right CVA tenderness, left CVA tenderness, guarding or rebound.    Genitourinary:    Comments: White curd like  substance on head of penis. Unable to retract foreskin however is chronic issue. No erythema, swelling, TTP, penile drainage. Able to urinate without difficulty No TTP, testicular swelling, erythema to testicles Musculoskeletal:     Cervical back: Normal range of motion and neck supple. No rigidity.     Right lower leg: No edema.     Left lower leg: No edema.  Skin:    General: Skin is warm.     Capillary Refill: Capillary refill takes less than 2 seconds.     Coloration: Skin is not jaundiced or pale.  Neurological:     Mental Status: He is alert and oriented to person, place, and time. Mental status is at baseline.     Gait: Gait normal.     ED Results / Procedures / Treatments   Labs (all labs ordered are listed, but only abnormal results are displayed) Labs Reviewed  URINALYSIS, ROUTINE W REFLEX MICROSCOPIC - Abnormal; Notable for the following components:      Result Value   APPearance HAZY (*)    Leukocytes,Ua TRACE (*)    Bacteria, UA RARE (*)    All other components within normal limits    EKG None  Radiology No results found.  Procedures Procedures    Medications Ordered in ED Medications - No data to display  ED Course/ Medical Decision Making/ A&P                                 Medical Decision Making Amount and/or Complexity of Data Reviewed Labs: ordered.   Patient presents to the ED for concern of right inguinal region pain, penile pain, this involves an extensive number of treatment options, and is a complaint that carries with it a high risk of complications and morbidity.  The differential diagnosis of penile pain includes fracture, laceration, candidiasis, infection, STD, paraphimosis, phimosis, UTI, urinary retention The differential diagnosis of right inguinal region pain is testicular torsion, STD, infection, abscess, hernia   Co morbidities that complicate the patient evaluation  BPH with chronic urinary complaints Chronic inability to  retract foreskin   Additional history obtained:  Additional history obtained from [facility], Nursing, and Outside Medical Records   External records from outside source obtained and reviewed including  Most recent neurology note from 01/26/2023 He has BPH with urinary hesitancy, weak stream, dribbling at baseline Is on Myrbetriq  25 mg daily and Flomax  daily   Lab Tests:  I Ordered, and personally interpreted labs.  The pertinent results include:   UA significant for trace leukocytes and rare bacteria with 6-10 WBC I believe this is likely due to yeast infections will treat and reassess   Imaging Studies ordered:  I ordered imaging studies including bladder scan I independently visualized and interpreted imaging which  showed postvoid less than 60 mL   Medicines ordered and prescription drug management:  I ordered medication including clotrimazole  for yeast inx  I have reviewed the patients home medicines and have made adjustments as needed    Problem List / ED Course:  Candidal balanitis Likely due to chronic inability to retract foreskin and clean area effectively.  This is also likely contributing to penile pain with urination UA shows mild leukocytes but does not show gross UTI.  Bladder scan shows less than 60 mL postvoid and does not indicate retention Patient is able to urinate at his baseline with some difficulty but this is a chronic condition d/t BPH, CKD is followed by urology Will provide clotrimazole  cream I discussed strict return to urgency part precautions to include paraphimosis, inability urinate, retention, testicular swelling or pain to patient and patient's granddaughter at bedside.  They expressed understanding agree with plan.  All questions answered to their satisfaction.  They will follow-up with PCP this week as scheduled for routine checkup Right inguinal pain I did not palpate any mass and patient was not exquisitely tender in right inguinal region.  I  wonder if he has a very small inguinal hernia in this region and, with intermittent pain, the hernia is easily reducible.  There is no overlying discoloration to area. I informed patient to keep an eye on this and discussed alarm symptoms to suggest a thrombosed hernia    Reevaluation:  After the interventions noted above, I reevaluated the patient and found that they have :stayed the same   Social Determinants of Health:  Has PCP, urology f/u   Dispostion:  After consideration of the diagnostic results and the patients response to treatment, I feel that the patent would benefit from outpatient management.    Final Clinical Impression(s) / ED Diagnoses Final diagnoses:  Candidal balanitis  Inguinal pain, right    Rx / DC Orders ED Discharge Orders          Ordered    clotrimazole  (LOTRIMIN ) 1 % cream        02/23/23 1548              Minnie Tinnie BRAVO, PA 02/23/23 2333    Suzette Pac, MD 02/26/23 1109

## 2023-02-23 NOTE — ED Triage Notes (Signed)
 Pt with right groin pain a week ago, with getting up from a sitting position. Denies any swelling

## 2023-02-23 NOTE — Progress Notes (Signed)
E-Visit for Urinary Problems  Based on what you shared with me, I feel your condition warrants further evaluation and I recommend that you be seen for a face to face office visit.  Male bladder infections are not very common.  We worry about prostate or kidney conditions.  The standard of care is to examine the abdomen and kidneys, and to do a urine and blood test to make sure that something more serious is not going on.  We recommend that you see a provider today.  If your doctor's office is closed [facility] has the following Urgent Cares:    NOTE: You will not be charged for this e-visit.  If you are having a true medical emergency please call 911.       For an urgent face to face visit, [facility] has six urgent care centers for your convenience:     [facility] [place] Get Driving Directions [phone] [address] , [place] [address]    [facility] ([place]) Get Driving Directions [phone] [address] [place], [place] [address]  [facility] ([place]) Get Driving Directions [phone] [address] [place],  [place]  [address]  [facility] Get Driving Directions [phone] [address] [place], [place] [address]   [facility] [place] Get Driving Directions  [phone] [address] [place], [place] [address]   [facility] [place] Get Driving Directions [phone] [address] [place], [place] [address]  Your MyChart E-visit questionnaire answers were reviewed by a board certified advanced clinical practitioner to complete your personal care plan based on your specific symptoms.  Thank you for using e-Visits.        Approximately 5 minutes was used in reviewing the patient's chart, questionnaire, prescribing  medications, and documentation.  

## 2023-02-28 ENCOUNTER — Ambulatory Visit: Payer: Medicare HMO | Admitting: Medical

## 2023-03-07 ENCOUNTER — Other Ambulatory Visit: Payer: Self-pay | Admitting: Medical

## 2023-03-15 DIAGNOSIS — E212 Other hyperparathyroidism: Secondary | ICD-10-CM | POA: Diagnosis not present

## 2023-03-16 ENCOUNTER — Other Ambulatory Visit: Payer: Self-pay | Admitting: Medical

## 2023-03-16 LAB — PTH, INTACT AND CALCIUM
Calcium: 10 mg/dL (ref 8.6–10.2)
PTH: 94 pg/mL — ABNORMAL HIGH (ref 15–65)

## 2023-03-16 LAB — TSH: TSH: 2.91 u[IU]/mL (ref 0.450–4.500)

## 2023-03-16 LAB — T4, FREE: Free T4: 1.27 ng/dL (ref 0.82–1.77)

## 2023-03-19 ENCOUNTER — Ambulatory Visit (INDEPENDENT_AMBULATORY_CARE_PROVIDER_SITE_OTHER): Payer: Medicare HMO | Admitting: "Endocrinology

## 2023-03-19 ENCOUNTER — Encounter: Payer: Self-pay | Admitting: "Endocrinology

## 2023-03-19 VITALS — BP 126/74 | HR 62 | Ht 66.0 in | Wt 188.8 lb

## 2023-03-19 DIAGNOSIS — M8580 Other specified disorders of bone density and structure, unspecified site: Secondary | ICD-10-CM

## 2023-03-19 DIAGNOSIS — E212 Other hyperparathyroidism: Secondary | ICD-10-CM

## 2023-03-19 NOTE — Progress Notes (Signed)
 03/19/2023, 11:31 AM  Endocrinology follow-up note  Scott Burton is a 88 y.o.-year-old male, returning for follow-up after he was seen in consultation for hypercalcemia/hyperparathyroidism.  He was initially referred by his PCP Tysinger, Kermit Balo, PA-C .  He is accompanied by his granddaughter to clinic today.  He is currently on Sensipar 30 mg p.o. daily at breakfast.  His previsit labs show calcium controlled at 10.1 with PTH 101.    Past Medical History:  Diagnosis Date   Arthritis    CKD (chronic kidney disease), stage III (HCC)    GERD (gastroesophageal reflux disease)    Gout    Hyperlipidemia    Hypertension    RBBB     Past Surgical History:  Procedure Laterality Date   COLONOSCOPY N/A 04/19/2016   Procedure: COLONOSCOPY;  Surgeon: West Bali, MD;  Location: [facility];  Service: Endoscopy;  Laterality: N/A;  1130    NO PAST SURGERIES      Social History   Tobacco Use   Smoking status: Former    Current packs/day: 0.00    Types: Cigarettes    Quit date: 01/17/1995    Years since quitting: 28.1    Passive exposure: Past   Smokeless tobacco: Former    Quit date: 01/28/1991  Vaping Use   Vaping status: Never Used  Substance Use Topics   Alcohol use: No   Drug use: No    Family History  Problem Relation Age of Onset   Cancer Father    Dementia Mother     Outpatient Encounter Medications as of 03/19/2023  Medication Sig   atorvastatin (LIPITOR) 10 MG tablet TAKE 1 TABLET EVERY DAY   Cholecalciferol (VITAMIN D3) 125 MCG (5000 UT) CAPS TAKE 1 CAPSULE EVERY DAY   cinacalcet (SENSIPAR) 30 MG tablet TAKE 1 TABLET EVERY DAY WITH BREAKFAST   clotrimazole (LOTRIMIN) 1 % cream Apply to affected area 2 times daily   diclofenac Sodium (VOLTAREN) 1 % GEL Apply 4 g topically 4 (four) times daily.   linaclotide (LINZESS) 72 MCG capsule TAKE 1 CAPSULE EVERY DAY BEFORE BREAKFAST   losartan (COZAAR) 50 MG tablet TAKE 1 TABLET EVERY DAY   mirabegron ER  (MYRBETRIQ) 25 MG TB24 tablet TAKE 1 TABLET EVERY DAY FOR BLADDER   PARoxetine (PAXIL) 10 MG tablet Take 1 tablet (10 mg total) by mouth daily.   polyethylene glycol powder (GLYCOLAX/MIRALAX) 17 GM/SCOOP powder Take 17 g by mouth daily as needed.   tamsulosin (FLOMAX) 0.4 MG CAPS capsule TAKE 1 CAPSULE EVERY DAY   traZODone (DESYREL) 50 MG tablet TAKE 1/2 TABLET(25 MG) BY MOUTH AT BEDTIME   No facility-administered encounter medications on file as of 03/19/2023.    No Known Allergies   HPI  Scott Burton was diagnosed with hypercalcemia at least since 2018.  Patient is not an optimal historian, with cognitive deficit accompanied by his granddaughter.   -He is tolerating his Sensipar 30 mg p.o. daily at breakfast.   He has no new complaints today.  His previsit labs are favorable with controlled hypercalcemia.  -He is not a surgical candidate for hyperparathyroidism.  -He is not taking any calcium supplements. His bone density study is consistent with osteopenia.  No prior history of fragility fractures or falls. No history of  kidney stones.  he is not on HCTZ or other thiazide therapy.  He has vitamin D deficiency and supplement with cholecalciferol 1000 units daily.   -  he eats dairy and green, leafy, vegetables on average amounts.  he does not have a family history of hypercalcemia, pituitary tumors, thyroid cancer, or osteoporosis.    ROS: Limited as above. PE: BP 126/74   Pulse 62   Ht 5\' 6"  (1.676 m)   Wt 188 lb 12.8 oz (85.6 kg)   BMI 30.47 kg/m , Body mass index is 30.47 kg/m. Wt Readings from Last 3 Encounters:  03/19/23 188 lb 12.8 oz (85.6 kg)  02/23/23 186 lb 8.2 oz (84.6 kg)  12/11/22 186 lb 9.6 oz (84.6 kg)       CMP ( most recent) CMP     Component Value Date/Time   NA 142 02/14/2022 1244   K 5.2 02/14/2022 1244   CL 104 02/14/2022 1244   CO2 23 02/14/2022 1244   GLUCOSE 92 02/14/2022 1244   GLUCOSE 85 08/11/2020 0904   BUN 14 02/14/2022 1244    CREATININE 1.71 (H) 02/14/2022 1244   CREATININE 1.66 (H) 08/11/2020 0904   CALCIUM 10.0 03/15/2023 0807   PROT 6.9 08/26/2021 1630   ALBUMIN 4.3 08/26/2021 1630   AST 17 08/26/2021 1630   ALT 12 08/26/2021 1630   ALKPHOS 79 08/26/2021 1630   BILITOT 0.7 08/26/2021 1630   GFRNONAA 34 (L) 07/22/2020 1144   GFRAA 40 (L) 07/22/2020 1144     Lipid Panel ( most recent) Lipid Panel     Component Value Date/Time   CHOL 162 08/26/2021 1630   TRIG 141 08/26/2021 1630   HDL 37 (L) 08/26/2021 1630   CHOLHDL 4.4 08/26/2021 1630   CHOLHDL 4.3 03/18/2020 1021   VLDL 43 (H) 09/07/2016 0801   LDLCALC 100 (H) 08/26/2021 1630   LDLCALC 100 (H) 03/18/2020 1021   LDLDIRECT 130 (H) 11/05/2018 1159   LABVLDL 25 08/26/2021 1630      Lab Results  Component Value Date   TSH 2.910 03/15/2023   TSH 2.330 02/14/2022   TSH 2.510 10/29/2019   TSH 2.02 07/23/2019   TSH 1.89 12/04/2017   TSH 1.40 04/26/2015   FREET4 1.27 03/15/2023   FREET4 1.22 10/29/2019    November 13, 2019 bone density results: DualFemur Neck Left 11/05/2019 82.0 N/A -2.2 0.786 g/cm2   Left Forearm Radius 33% 11/05/2019 82.0 N/A -0.3 0.782 g/cm2 ASSESSMENT: BMD as determined from Femur Neck Left is 0.786 g/cm2 with a T-score of -2.2. This patient is considered osteopenic by World Healh Organization (WHO) Criteria. Lumbar spine was excluded due to advanced degenerative changes.    Assessment: 1. Hypercalcemia / Hyperparathyroidism 2.  Osteopenia 3.  Vitamin D deficiency  Plan: His presentation has been consistent with mild, primary hyperparathyroidism.  He is not a surgical candidate.  He is responding to low-dose Sensipar.   He has stage 3-4 CKD.  He is not a surgical candidate for hyperparathyroidism.  He is advised to continue  Sensipar  30 mg p.o. every  day with breakfast with plan to repeat PTH/calcium and office visit in 6 months.  Side effects and precautions discussed with him.      He has CKD and osteopenia.   He is advised to maintain close follow-up with the nephrologist, PMD.   Regarding his vitamin D deficiency: He is advised to continue vitamin D supplements.   He is advised to continue close follow-up with his PMD .    I spent  21  minutes in the care of the patient today including review of labs from Thyroid Function, CMP, and other relevant  labs ; imaging/biopsy records (current and previous including abstractions from other facilities); face-to-face time discussing  his lab results and symptoms, medications doses, his options of short and long term treatment based on the latest standards of care / guidelines;   and documenting the encounter.  Chryl Heck  participated in the discussions, expressed understanding, and voiced agreement with the above plans.  All questions were answered to his satisfaction. he is encouraged to contact clinic should he have any questions or concerns prior to his return visit.    - Return in about 6 months (around 09/19/2023) for F/U with Pre-visit Labs.   Marquis Lunch, MD [facility] [address] [place], [place] [address] Phone: [phone]  Fax: [phone]    This note was partially dictated with voice recognition software. Similar sounding words can be transcribed inadequately or may not  be corrected upon review.  03/19/2023, 11:31 AM

## 2023-03-22 ENCOUNTER — Other Ambulatory Visit: Payer: Self-pay | Admitting: Medical

## 2023-03-22 DIAGNOSIS — F32A Depression, unspecified: Secondary | ICD-10-CM

## 2023-03-28 ENCOUNTER — Ambulatory Visit: Payer: Medicare HMO | Admitting: Medical

## 2023-04-11 ENCOUNTER — Encounter: Payer: Self-pay | Admitting: Medical

## 2023-04-11 ENCOUNTER — Ambulatory Visit (INDEPENDENT_AMBULATORY_CARE_PROVIDER_SITE_OTHER): Admitting: Medical

## 2023-04-11 VITALS — BP 120/80 | HR 80 | Ht 67.0 in | Wt 189.6 lb

## 2023-04-11 DIAGNOSIS — Z23 Encounter for immunization: Secondary | ICD-10-CM

## 2023-04-11 DIAGNOSIS — Z0000 Encounter for general adult medical examination without abnormal findings: Secondary | ICD-10-CM | POA: Diagnosis not present

## 2023-04-11 DIAGNOSIS — E559 Vitamin D deficiency, unspecified: Secondary | ICD-10-CM

## 2023-04-11 DIAGNOSIS — F419 Anxiety disorder, unspecified: Secondary | ICD-10-CM | POA: Diagnosis not present

## 2023-04-11 DIAGNOSIS — N183 Chronic kidney disease, stage 3 unspecified: Secondary | ICD-10-CM

## 2023-04-11 DIAGNOSIS — R300 Dysuria: Secondary | ICD-10-CM

## 2023-04-11 DIAGNOSIS — R7301 Impaired fasting glucose: Secondary | ICD-10-CM | POA: Diagnosis not present

## 2023-04-11 DIAGNOSIS — E790 Hyperuricemia without signs of inflammatory arthritis and tophaceous disease: Secondary | ICD-10-CM | POA: Diagnosis not present

## 2023-04-11 DIAGNOSIS — I10 Essential (primary) hypertension: Secondary | ICD-10-CM | POA: Diagnosis not present

## 2023-04-11 DIAGNOSIS — F32A Depression, unspecified: Secondary | ICD-10-CM

## 2023-04-11 DIAGNOSIS — N481 Balanitis: Secondary | ICD-10-CM | POA: Insufficient documentation

## 2023-04-11 DIAGNOSIS — E7849 Other hyperlipidemia: Secondary | ICD-10-CM

## 2023-04-11 MED ORDER — NYSTATIN 100000 UNIT/GM EX CREA: 1.0000 | TOPICAL_CREAM | Freq: Two times a day (BID) | CUTANEOUS | 0 refills | Status: AC

## 2023-04-11 NOTE — Addendum Note (Signed)
 Addended by: Jac Canavan on: 04/11/2023 04:35 PM   Modules accepted: Level of Service

## 2023-04-11 NOTE — Progress Notes (Signed)
 Subjective:   HPI  Scott Burton is a 88 y.o. male who presents for Chief Complaint  Patient presents with   Annual Exam    Fasting cpe, ate at 7am. Still having problems going to the bathroom- even being on the medications. Having a stinging sensation when he starts to pee.      Patient Care Team: Fawna Cranmer, Kermit Balo, PA-C as PCP - General (Family Medicine) Evette Georges, NP , Urology Dr. Purcell Nails, endocrinology Dr. Jonette Eva, GI Doctor in [place] He has dentures, no dentist   Concerns: Here with daughter [name] today who lives in [place].  She helps watch after him as well as his granddaughter [name] and his son.  He lives with his son and his daughter-in-law and their child.Marland Kitchen  He lives way up in [facility]  Overall been doing okay.  He still drives some.  He handles some of his own finances.  He does not cook.  his son helps prepare his meals.  He eats on average twice a day and does some snacking in between.  Mood usually is pretty good.  Sometimes he feels down.  He is more happy than he is down.  He is happy when he is at church.  He prays a lot.  His main concern today is urinary hesitancy and urgency for the past few months.  Sometimes things when he urinates.  He has some constipation, uses Linzess on average every other day.  He has some memory decline    Reviewed their medical, surgical, family, social, medication, and allergy history and updated chart as appropriate.       04/11/2023    1:53 PM 05/30/2022   11:34 AM 04/05/2022   10:04 AM 02/14/2022   12:58 PM 02/14/2022   11:49 AM  Depression screen PHQ 2/9  Decreased Interest 0 0 0 2 0  Down, Depressed, Hopeless 0 0 0 3 0  PHQ - 2 Score 0 0 0 5 0  Altered sleeping  1  2   Tired, decreased energy  0  2   Change in appetite  0  0   Feeling bad or failure about yourself   0  0   Trouble concentrating  0  0   Moving slowly or fidgety/restless  0  3   Suicidal thoughts  0   0   PHQ-9 Score  1  12   Difficult doing work/chores  Not difficult at all  Not difficult at all       08/30/2021   11:38 AM 02/14/2022   11:48 AM 04/05/2022   10:04 AM 05/30/2022   11:34 AM 04/11/2023    1:53 PM  Fall Risk  Falls in the past year?  0 0 0 0  Was there an injury with Fall?  0 0 0 0  Fall Risk Category Calculator  0 0 0 0  (RETIRED) Patient Fall Risk Level Low fall risk      Patient at Risk for Falls Due to  No Fall Risks No Fall Risks Medication side effect No Fall Risks  Fall risk Follow up  Falls evaluation completed Falls evaluation completed Falls prevention discussed;Education provided;Falls evaluation completed Falls evaluation completed     No Known Allergies  Past Medical History:  Diagnosis Date   Arthritis    CKD (chronic kidney disease), stage III (HCC)    GERD (gastroesophageal reflux disease)    Gout    Hyperlipidemia    Hypertension  RBBB     Current Outpatient Medications on File Prior to Visit  Medication Sig Dispense Refill   atorvastatin (LIPITOR) 10 MG tablet TAKE 1 TABLET EVERY DAY 90 tablet 0   Cholecalciferol (VITAMIN D3) 125 MCG (5000 UT) CAPS TAKE 1 CAPSULE EVERY DAY 90 capsule 0   clotrimazole (LOTRIMIN) 1 % cream Apply to affected area 2 times daily 15 g 0   linaclotide (LINZESS) 72 MCG capsule TAKE 1 CAPSULE EVERY DAY BEFORE BREAKFAST 90 capsule 1   losartan (COZAAR) 50 MG tablet TAKE 1 TABLET EVERY DAY 90 tablet 1   mirabegron ER (MYRBETRIQ) 25 MG TB24 tablet TAKE 1 TABLET EVERY DAY FOR BLADDER 90 tablet 1   PARoxetine (PAXIL) 10 MG tablet Take 1 tablet (10 mg total) by mouth daily. 30 tablet 1   polyethylene glycol powder (GLYCOLAX/MIRALAX) 17 GM/SCOOP powder Take 17 g by mouth daily as needed. 3350 g 1   tamsulosin (FLOMAX) 0.4 MG CAPS capsule TAKE 1 CAPSULE EVERY DAY 90 capsule 0   traZODone (DESYREL) 50 MG tablet TAKE 1/2 TABLET AT BEDTIME 45 tablet 0   cinacalcet (SENSIPAR) 30 MG tablet TAKE 1 TABLET EVERY DAY WITH BREAKFAST  90 tablet 3   diclofenac Sodium (VOLTAREN) 1 % GEL Apply 4 g topically 4 (four) times daily. (Patient not taking: Reported on 04/11/2023) 150 g 2   No current facility-administered medications on file prior to visit.      Current Outpatient Medications:    atorvastatin (LIPITOR) 10 MG tablet, TAKE 1 TABLET EVERY DAY, Disp: 90 tablet, Rfl: 0   Cholecalciferol (VITAMIN D3) 125 MCG (5000 UT) CAPS, TAKE 1 CAPSULE EVERY DAY, Disp: 90 capsule, Rfl: 0   clotrimazole (LOTRIMIN) 1 % cream, Apply to affected area 2 times daily, Disp: 15 g, Rfl: 0   linaclotide (LINZESS) 72 MCG capsule, TAKE 1 CAPSULE EVERY DAY BEFORE BREAKFAST, Disp: 90 capsule, Rfl: 1   losartan (COZAAR) 50 MG tablet, TAKE 1 TABLET EVERY DAY, Disp: 90 tablet, Rfl: 1   mirabegron ER (MYRBETRIQ) 25 MG TB24 tablet, TAKE 1 TABLET EVERY DAY FOR BLADDER, Disp: 90 tablet, Rfl: 1   nystatin cream (MYCOSTATIN), Apply 1 Application topically 2 (two) times daily., Disp: 30 g, Rfl: 0   PARoxetine (PAXIL) 10 MG tablet, Take 1 tablet (10 mg total) by mouth daily., Disp: 30 tablet, Rfl: 1   polyethylene glycol powder (GLYCOLAX/MIRALAX) 17 GM/SCOOP powder, Take 17 g by mouth daily as needed., Disp: 3350 g, Rfl: 1   tamsulosin (FLOMAX) 0.4 MG CAPS capsule, TAKE 1 CAPSULE EVERY DAY, Disp: 90 capsule, Rfl: 0   traZODone (DESYREL) 50 MG tablet, TAKE 1/2 TABLET AT BEDTIME, Disp: 45 tablet, Rfl: 0   cinacalcet (SENSIPAR) 30 MG tablet, TAKE 1 TABLET EVERY DAY WITH BREAKFAST, Disp: 90 tablet, Rfl: 3   diclofenac Sodium (VOLTAREN) 1 % GEL, Apply 4 g topically 4 (four) times daily. (Patient not taking: Reported on 04/11/2023), Disp: 150 g, Rfl: 2  Family History  Problem Relation Age of Onset   Cancer Father    Dementia Mother     Past Surgical History:  Procedure Laterality Date   COLONOSCOPY N/A 04/19/2016   Procedure: COLONOSCOPY;  Surgeon: West Bali, MD;  Location: [facility];  Service: Endoscopy;  Laterality: N/A;  1130    NO PAST SURGERIES       Review of Systems  Constitutional:  Negative for chills, fever, malaise/fatigue and weight loss.  HENT:  Negative for congestion, ear pain, hearing loss,  sore throat and tinnitus.   Eyes:  Negative for blurred vision, pain and redness.  Respiratory:  Negative for cough, hemoptysis and shortness of breath.   Cardiovascular:  Negative for chest pain, palpitations, orthopnea, claudication and leg swelling.  Gastrointestinal:  Negative for abdominal pain, blood in stool, constipation, diarrhea, nausea and vomiting.  Genitourinary:  Positive for frequency and urgency. Negative for dysuria, flank pain and hematuria.  Musculoskeletal:  Negative for falls, joint pain and myalgias.  Skin:  Negative for itching and rash.  Neurological:  Negative for dizziness, tingling, speech change, weakness and headaches.  Endo/Heme/Allergies:  Negative for polydipsia. Does not bruise/bleed easily.  Psychiatric/Behavioral:  Negative for depression and memory loss. The patient is not nervous/anxious and does not have insomnia.        Objective:  BP 120/80   Pulse 80   Ht 5\' 7"  (1.702 m)   Wt 189 lb 9.6 oz (86 kg)   BMI 29.70 kg/m   General appearance: alert, no distress, WD/WN, African American male Skin: unremarkable HEENT: normocephalic, conjunctiva/corneas normal, sclerae anicteric, PERRLA, EOMi, nares patent, no discharge or erythema, pharynx normal Oral cavity: MMM, tongue normal, teeth - dentures Neck: supple, no lymphadenopathy, no thyromegaly, no masses, normal ROM, no bruits Chest: non tender, normal shape and expansion Heart: RRR, normal S1, S2, no murmurs Lungs: CTA bilaterally, no wheezes, rhonchi, or rales Abdomen: +bs, soft, non tender, non distended, no masses, no hepatomegaly, no splenomegaly, no bruits Back: non tender, normal ROM, no scoliosis Musculoskeletal: upper extremities non tender, no obvious deformity, normal ROM throughout, lower extremities non tender, no obvious  deformity, normal ROM throughout Extremities: no edema, no cyanosis, no clubbing Pulses: 2+ symmetric, upper and lower extremities, normal cap refill Neurological: alert, oriented x 3, CN2-12 intact, strength normal upper extremities and lower extremities, sensation normal throughout, DTRs 2+ throughout, no cerebellar signs, gait normal Psychiatric: normal affect, behavior normal, pleasant  GU: penis uncircumcised, foreskin not retracting fully, there is with crust underneath foreskin, some erythema, nontender, no masses, no hernia, no lymphadenopathy Rectal: declined    Assessment and Plan :   Encounter Diagnoses  Name Primary?   Encounter for health maintenance examination in adult Yes   Impaired fasting blood sugar    Stage 3 chronic kidney disease, unspecified whether stage 3a or 3b CKD (HCC)    Vitamin D deficiency    Other hyperlipidemia    Essential hypertension, benign    Anxiety and depression    Elevated blood uric acid level    Dysuria    Need for pneumococcal 20-valent conjugate vaccination    Balanitis     This visit was a preventative care visit, also known as wellness visit or routine physical.   Topics typically include healthy lifestyle, diet, exercise, preventative care, vaccinations, sick and well care, proper use of emergency dept and after hours care, as well as other concerns.     Separate significant issues discussed: Impaired fasting glucose - labs today  CKD 3 - updated labs today  Vitamin D deficiency - continue vitamin D supplement  HTN - continue losartan 50mg  daily  Hyperlipidemia - continue atorvastatin 10mg  daily, labs today  BPH - continue on Flomax 0.4mg   Urinary frequency/OAB - continue on Myrbetriq 25mg  daily  Constipation - uses linzess on average every other day  Balanitis - discussed hygiene, pull back forskin to clean, begin nystatin cream as discussed, and make follow up appointment with urology in 2-3 weeks    General  Recommendations:  Continue to return yearly for your annual wellness and preventative care visits.  This gives [place] a chance to discuss healthy lifestyle, exercise, vaccinations, review your chart record, and perform screenings where appropriate.  I recommend you see your eye doctor yearly for routine vision care.  I recommend you see your dentist yearly for routine dental care including hygiene visits twice yearly.   Vaccination  Immunization History  Administered Date(s) Administered   Fluad Quad(high Dose 65+) 11/25/2019   Fluad Trivalent(High Dose 65+) 12/11/2022   Influenza Inj Mdck Quad Pf 10/22/2018   Influenza, High Dose Seasonal PF 01/15/2017, 12/04/2017   Influenza,inj,Quad PF,6+ Mos 09/25/2014, 09/22/2015   Moderna Sars-Covid-2 Vaccination 05/08/2019, 06/05/2019   PNEUMOCOCCAL CONJUGATE-20 04/11/2023   Zoster, Live 02/04/2014    Vaccine recommendations: Tdap and Shingrix at pharmacy  Vaccines administered today: Counseled on the pneumococcal vaccine.  Vaccine information sheet given.  Pneumococcal vaccine Prevnar 20 given after consent obtained.    Screening for cancer: Colon cancer screening: Prior or last colon cancer screen: 2018 reviewed   Skin cancer screening: Check your skin regularly for new changes, growing lesions, or other lesions of concern Come in for evaluation if you have skin lesions of concern.   Lung cancer screening: If you have a greater than 20 pack year history of tobacco use, then you may qualify for lung cancer screening with a chest CT scan.   Please call your insurance company to inquire about coverage for this test.   Pancreatic cancer:  no current screening test is available or routinely recommended. (risk factors: smoking, overweight or obese, diabetes, chronic pancreatitis, work exposure - dry cleaning, metal working, 88yo>, M>F, Tree surgeon, family hx/o, hereditary breast, ovarian, melanoma, lynch, peutz-jeghers).  Symptoms:  jaundice, dark urine, light color or greasy stools, itchy skin, belly or back pain, weight loss, poor appetite, nausea, vomiting, liver enlargement, DVT/blood clots.   We currently don't have screenings for other cancers besides breast, cervical, colon, and lung cancers.  If you have a strong family history of cancer or have other cancer screening concerns, please let me know.  Genetic testing referral is an option for individuals with high cancer risk in the family.  There are some other cancer screenings in development currently.   Bone health: Get at least 150 minutes of aerobic exercise weekly Get weight bearing exercise at least once weekly Bone density test:  A bone density test is an imaging test that uses a type of X-ray to measure the amount of calcium and other minerals in your bones. The test may be used to diagnose or screen you for a condition that causes weak or thin bones (osteoporosis), predict your risk for a broken bone (fracture), or determine how well your osteoporosis treatment is working. The bone density test is recommended for females 65 and older, or females or males <65 if certain risk factors such as thyroid disease, long term use of steroids such as for asthma or rheumatological issues, vitamin D deficiency, estrogen deficiency, family history of osteoporosis, self or family history of fragility fracture in first degree relative.    Heart health: Get at least 150 minutes of aerobic exercise weekly Limit alcohol It is important to maintain a healthy blood pressure and healthy cholesterol numbers  Heart disease screening: Screening for heart disease includes screening for blood pressure, fasting lipids, glucose/diabetes screening, BMI height to weight ratio, reviewed of smoking status, physical activity, and diet.    Goals include blood pressure 120/80 or less, maintaining a  healthy lipid/cholesterol profile, preventing diabetes or keeping diabetes numbers under good  control, not smoking or using tobacco products, exercising most days per week or at least 150 minutes per week of exercise, and eating healthy variety of fruits and vegetables, healthy oils, and avoiding unhealthy food choices like fried food, fast food, high sugar and high cholesterol foods.    Other tests may possibly include EKG test, CT coronary calcium score, echocardiogram, exercise treadmill stress test.      Vascular disease screening: For higher risk individuals including smokers, diabetics, patients with known heart disease or high blood pressure, kidney disease, and others, screening for vascular disease or atherosclerosis of the arteries is available.  Examples may include carotid ultrasound, abdominal aortic ultrasound, ABI blood flow screening in the legs, thoracic aorta screening.    Medical care options: I recommend you continue to seek care here first for routine care.  We try really hard to have available appointments Monday through Friday daytime hours for sick visits, acute visits, and physicals.  Urgent care should be used for after hours and weekends for significant issues that cannot wait till the next day.  The emergency department should be used for significant potentially life-threatening emergencies.  The emergency department is expensive, can often have long wait times for less significant concerns, so try to utilize primary care, urgent care, or telemedicine when possible to avoid unnecessary trips to the emergency department.  Virtual visits and telemedicine have been introduced since the pandemic started in 2020, and can be convenient ways to receive medical care.  We offer virtual appointments as well to assist you in a variety of options to seek medical care.   Legal  Take the time to do a last will and testament, Advanced Directives including Health Care Power of Attorney and Living Will documents.  Don't leave your family with burdens that can be handled ahead of  time.   Advanced Directives: I recommend you consider completing a Health Care Power of Attorney and Living Will.   These documents respect your wishes and help alleviate burdens on your loved ones if you were to become terminally ill or be in a position to need those documents enforced.    You can complete Advanced Directives yourself, have them notarized, then have copies made for our office, for you and for anybody you feel should have them in safe keeping.  Or, you can have an attorney prepare these documents.   If you haven't updated your Last Will and Testament in a while, it may be worthwhile having an attorney prepare these documents together and save on some costs.        Jerik was seen today for annual exam.  Diagnoses and all orders for this visit:  Encounter for health maintenance examination in adult -     Comprehensive metabolic panel -     CBC with Differential/Platelet -     Lipid panel -     Uric acid -     Urinalysis, Routine w reflex microscopic -     Hemoglobin A1c  Impaired fasting blood sugar -     Hemoglobin A1c  Stage 3 chronic kidney disease, unspecified whether stage 3a or 3b CKD (HCC) -     Comprehensive metabolic panel -     CBC with Differential/Platelet  Vitamin D deficiency  Other hyperlipidemia -     Lipid panel  Essential hypertension, benign  Anxiety and depression  Elevated blood uric acid level -  Uric acid  Dysuria -     Urinalysis, Routine w reflex microscopic  Need for pneumococcal 20-valent conjugate vaccination -     Pneumococcal conjugate vaccine 20-valent (Prevnar 20)  Balanitis  Other orders -     nystatin cream (MYCOSTATIN); Apply 1 Application topically 2 (two) times daily.     Follow-up pending labs, yearly for physical

## 2023-04-12 ENCOUNTER — Other Ambulatory Visit: Payer: Self-pay | Admitting: Medical

## 2023-04-12 DIAGNOSIS — F32A Depression, unspecified: Secondary | ICD-10-CM

## 2023-04-12 LAB — CBC WITH DIFFERENTIAL/PLATELET
Basophils Absolute: 0.1 10*3/uL (ref 0.0–0.2)
Basos: 1 %
EOS (ABSOLUTE): 0.2 10*3/uL (ref 0.0–0.4)
Eos: 2 %
Hematocrit: 42.7 % (ref 37.5–51.0)
Hemoglobin: 14.2 g/dL (ref 13.0–17.7)
Immature Grans (Abs): 0 10*3/uL (ref 0.0–0.1)
Immature Granulocytes: 0 %
Lymphocytes Absolute: 2.8 10*3/uL (ref 0.7–3.1)
Lymphs: 38 %
MCH: 30.2 pg (ref 26.6–33.0)
MCHC: 33.3 g/dL (ref 31.5–35.7)
MCV: 91 fL (ref 79–97)
Monocytes Absolute: 0.4 10*3/uL (ref 0.1–0.9)
Monocytes: 5 %
Neutrophils Absolute: 3.9 10*3/uL (ref 1.4–7.0)
Neutrophils: 54 %
Platelets: 194 10*3/uL (ref 150–450)
RBC: 4.7 x10E6/uL (ref 4.14–5.80)
RDW: 12.4 % (ref 11.6–15.4)
WBC: 7.2 10*3/uL (ref 3.4–10.8)

## 2023-04-12 LAB — MICROSCOPIC EXAMINATION
Casts: NONE SEEN /LPF
Epithelial Cells (non renal): >10 /HPF — AB (ref 0–10)

## 2023-04-12 LAB — COMPREHENSIVE METABOLIC PANEL WITH GFR
ALT: 15 IU/L (ref 0–44)
AST: 16 IU/L (ref 0–40)
Albumin: 4.2 g/dL (ref 3.7–4.7)
Alkaline Phosphatase: 75 IU/L (ref 44–121)
BUN/Creatinine Ratio: 13 (ref 10–24)
BUN: 21 mg/dL (ref 8–27)
Bilirubin Total: 0.6 mg/dL (ref 0.0–1.2)
CO2: 22 mmol/L (ref 20–29)
Calcium: 10 mg/dL (ref 8.6–10.2)
Chloride: 105 mmol/L (ref 96–106)
Creatinine, Ser: 1.66 mg/dL — ABNORMAL HIGH (ref 0.76–1.27)
Globulin, Total: 2.8 g/dL (ref 1.5–4.5)
Glucose: 78 mg/dL (ref 70–99)
Potassium: 4.5 mmol/L (ref 3.5–5.2)
Sodium: 142 mmol/L (ref 134–144)
Total Protein: 7 g/dL (ref 6.0–8.5)
eGFR: 40 mL/min/{1.73_m2} — ABNORMAL LOW (ref >59)

## 2023-04-12 LAB — URINALYSIS, ROUTINE W REFLEX MICROSCOPIC
Bilirubin, UA: NEGATIVE
Glucose, UA: NEGATIVE
Ketones, UA: NEGATIVE
Nitrite, UA: NEGATIVE
RBC, UA: NEGATIVE
Specific Gravity, UA: 1.019 (ref 1.005–1.030)
Urobilinogen, Ur: 0.2 mg/dL (ref 0.2–1.0)
pH, UA: 5.5 (ref 5.0–7.5)

## 2023-04-12 LAB — URIC ACID: Uric Acid: 7.6 mg/dL (ref 3.8–8.4)

## 2023-04-12 LAB — LIPID PANEL
Chol/HDL Ratio: 4.7 ratio (ref 0.0–5.0)
Cholesterol, Total: 194 mg/dL (ref 100–199)
HDL: 41 mg/dL (ref >39)
LDL Chol Calc (NIH): 120 mg/dL — ABNORMAL HIGH (ref 0–99)
Triglycerides: 189 mg/dL — ABNORMAL HIGH (ref 0–149)
VLDL Cholesterol Cal: 33 mg/dL (ref 5–40)

## 2023-04-12 LAB — HEMOGLOBIN A1C
Est. average glucose Bld gHb Est-mCnc: 97 mg/dL
Hgb A1c MFr Bld: 5 % (ref 4.8–5.6)

## 2023-04-12 MED ORDER — ATORVASTATIN CALCIUM 20 MG PO TABS: 20.0000 mg | ORAL_TABLET | Freq: Every day | ORAL | 2 refills | Status: AC

## 2023-04-12 MED ORDER — MIRABEGRON ER 25 MG PO TB24: 25.0000 mg | ORAL_TABLET | Freq: Every day | ORAL | 2 refills | Status: DC

## 2023-04-12 MED ORDER — SULFAMETHOXAZOLE-TRIMETHOPRIM 800-160 MG PO TABS: 1.0000 | ORAL_TABLET | Freq: Two times a day (BID) | ORAL | 0 refills | Status: DC

## 2023-04-12 MED ORDER — PAROXETINE HCL 10 MG PO TABS: 10.0000 mg | ORAL_TABLET | Freq: Every day | ORAL | 2 refills | Status: AC

## 2023-04-12 MED ORDER — POLYETHYLENE GLYCOL 3350 17 GM/SCOOP PO POWD: 17.0000 g | Freq: Every day | ORAL | 1 refills | Status: AC | PRN

## 2023-04-12 NOTE — Progress Notes (Signed)
 Kidney marker stable, electrolytes and liver are okay, blood counts okay, uric acid okay, diabetes marker okay.  There appears to possibly be a bacterial urinary infection as well as the yeast concern yseterday.  So lets start oral antibiotic by mouth Bactrim as well as doing the topical cream on the head of the penis and inside the foreskin. I sent in yesterday.  That cream was called nystatin  Cholesterol elevated so I increased his dose of Lipitor so when he gets the refill it will be a higher dose

## 2023-04-19 ENCOUNTER — Telehealth: Payer: Self-pay | Admitting: Medical

## 2023-04-19 MED ORDER — SULFAMETHOXAZOLE-TRIMETHOPRIM 800-160 MG PO TABS: 1.0000 | ORAL_TABLET | Freq: Two times a day (BID) | ORAL | 0 refills | Status: DC

## 2023-04-19 NOTE — Telephone Encounter (Signed)
 Pt is needing Bactrim to be sent in to [id number] - [place], [place] - [address]   It was sent through mail order and he still hasn't received it and he is in pain from uti.

## 2023-04-19 NOTE — Telephone Encounter (Signed)
 sent

## 2023-04-23 ENCOUNTER — Other Ambulatory Visit: Payer: Self-pay | Admitting: Medical

## 2023-06-05 ENCOUNTER — Other Ambulatory Visit: Payer: Self-pay | Admitting: Medical

## 2023-06-29 ENCOUNTER — Other Ambulatory Visit: Payer: Self-pay | Admitting: Medical

## 2023-07-17 DIAGNOSIS — F32A Depression, unspecified: Secondary | ICD-10-CM

## 2023-07-17 MED ORDER — VITAMIN D3 125 MCG (5000 UT) PO CAPS: 1.0000 | ORAL_CAPSULE | Freq: Every day | ORAL | 1 refills | Status: DC

## 2023-07-17 MED ORDER — LOSARTAN POTASSIUM 50 MG PO TABS: 50.0000 mg | ORAL_TABLET | Freq: Every day | ORAL | 1 refills | Status: DC

## 2023-07-26 ENCOUNTER — Ambulatory Visit: Payer: Medicare HMO | Admitting: Urology

## 2023-07-26 ENCOUNTER — Encounter: Payer: Self-pay | Admitting: Urology

## 2023-07-26 VITALS — BP 127/75 | HR 88

## 2023-07-26 DIAGNOSIS — R3911 Hesitancy of micturition: Secondary | ICD-10-CM | POA: Diagnosis not present

## 2023-07-26 DIAGNOSIS — N401 Enlarged prostate with lower urinary tract symptoms: Secondary | ICD-10-CM | POA: Diagnosis not present

## 2023-07-26 DIAGNOSIS — Z87438 Personal history of other diseases of male genital organs: Secondary | ICD-10-CM

## 2023-07-26 DIAGNOSIS — N481 Balanitis: Secondary | ICD-10-CM

## 2023-07-26 DIAGNOSIS — N471 Phimosis: Secondary | ICD-10-CM

## 2023-07-26 LAB — MICROSCOPIC EXAMINATION

## 2023-07-26 LAB — URINALYSIS, ROUTINE W REFLEX MICROSCOPIC
Bilirubin, UA: NEGATIVE
Glucose, UA: NEGATIVE
Ketones, UA: NEGATIVE
Nitrite, UA: NEGATIVE
RBC, UA: NEGATIVE
Specific Gravity, UA: 1.03 (ref 1.005–1.030)
Urobilinogen, Ur: 1 mg/dL (ref 0.2–1.0)
pH, UA: 6 (ref 5.0–7.5)

## 2023-07-26 LAB — BLADDER SCAN AMB NON-IMAGING: Scan Result: 13

## 2023-07-26 MED ORDER — CLOTRIMAZOLE 1 % EX CREA: TOPICAL_CREAM | CUTANEOUS | 0 refills | Status: AC

## 2023-07-26 NOTE — Progress Notes (Signed)
 Name: Scott Burton DOB: 1935-09-27 MRN: [id number]  History of Present Illness: Scott Burton is a 88 y.o. male who presents today for follow up visit at [facility] . He is accompanied by his daughter Scott Burton, who assists with providing history due to patient's mild cognitive impairment (he is a poor historian). Relevant History includes: 1. BPH with LUTS (weak stream, hesitancy, terminal dribbling). - 09/07/2016: PSA was normal (3.4). - Taking Myrbetriq  25 mg daily and Flomax  0.4 mg daily.    At 1st visit on 01/26/2023: We reviewed his situation in detail; somewhat poor historian. We discussed BPH and/or his chronic constipation as possible contributing factors for weak urinary stream. No acute findings today; asymptomatic for UTI and PVR is unremarkable. Discussed option to increase Flomax  to 2x/day; he reported being happy with his current symptomatic control and elected to continue current treatment regimen.   Since last visit: > 02/23/2023: Seen in ER for candidal balanitis. Exam also revealed phimosis. Clotrimazole  cream prescribed.  Today: He reports doing well with no acute GU concerns. He states his LUTS are not bothersome at this time. Also denies any recent balanitis flare ups; he is requesting a Clotrimazole  cream refill however for PRN use.  Medications: Current Outpatient Medications  Medication Sig Dispense Refill   atorvastatin  (LIPITOR) 20 MG tablet Take 1 tablet (20 mg total) by mouth daily. 90 tablet 2   Cholecalciferol  (VITAMIN D3) 125 MCG (5000 UT) CAPS Take 1 capsule (5,000 Units total) by mouth daily. 90 capsule 1   cinacalcet  (SENSIPAR ) 30 MG tablet TAKE 1 TABLET EVERY DAY WITH BREAKFAST 90 tablet 3   diclofenac  Sodium (VOLTAREN ) 1 % GEL Apply 4 g topically 4 (four) times daily. 150 g 2   linaclotide  (LINZESS ) 72 MCG capsule TAKE 1 CAPSULE EVERY DAY BEFORE BREAKFAST 90 capsule 1   losartan  (COZAAR ) 50 MG tablet Take 1 tablet (50 mg total) by mouth  daily. 90 tablet 1   mirabegron  ER (MYRBETRIQ ) 25 MG TB24 tablet Take 1 tablet (25 mg total) by mouth daily. 90 tablet 2   nystatin  cream (MYCOSTATIN ) Apply 1 Application topically 2 (two) times daily. 30 g 0   PARoxetine  (PAXIL ) 10 MG tablet Take 1 tablet (10 mg total) by mouth daily. 90 tablet 2   polyethylene glycol powder (GLYCOLAX /MIRALAX ) 17 GM/SCOOP powder Take 17 g by mouth daily as needed. 3350 g 1   tamsulosin  (FLOMAX ) 0.4 MG CAPS capsule TAKE 1 CAPSULE EVERY DAY 90 capsule 1   traZODone  (DESYREL ) 50 MG tablet TAKE 1/2 TABLET AT BEDTIME 45 tablet 3   clotrimazole  (LOTRIMIN ) 1 % cream Apply to affected area on head of the penis 2 times daily as needed. 15 g 0   No current facility-administered medications for this visit.    Allergies: No Known Allergies  Past Medical History:  Diagnosis Date   Arthritis    CKD (chronic kidney disease), stage III (HCC)    GERD (gastroesophageal reflux disease)    Gout    Hyperlipidemia    Hypertension    RBBB    Past Surgical History:  Procedure Laterality Date   COLONOSCOPY N/A 04/19/2016   Procedure: COLONOSCOPY;  Surgeon: Margo LITTIE Haddock, MD;  [facility];  Service: Endoscopy;  Laterality: N/A;  1130    NO PAST SURGERIES     Family History  Problem Relation Age of Onset   Cancer Father    Dementia Mother    Social History   Socioeconomic History  Marital status: Widowed    Spouse name: Not on file   Number of children: 5   Years of education: 7   Highest education level: 12th grade  Occupational History   Not on file  Tobacco Use   Smoking status: Former    Current packs/day: 0.00    Types: Cigarettes    Quit date: 01/17/1995    Years since quitting: 28.5    Passive exposure: Past   Smokeless tobacco: Former    Quit date: 01/28/1991  Vaping Use   Vaping status: Never Used  Substance and Sexual Activity   Alcohol use: No   Drug use: No   Sexual activity: Not Currently  Other Topics Concern   Not on file   Social History Narrative   5 children, 3 children living, 12 grandchildren, 15 great grandchildren.   Wife died in Aug 26, 2006.   Social Drivers of Corporate investment banker Strain: Low Risk  (05/30/2022)   Overall Financial Resource Strain (CARDIA)    Difficulty of Paying Living Expenses: Not hard at all  Food Insecurity: No Food Insecurity (05/30/2022)   Hunger Vital Sign    Worried About Running Out of Food in the Last Year: Never true    Ran Out of Food in the Last Year: Never true  Transportation Needs: No Transportation Needs (05/30/2022)   PRAPARE - Administrator, Civil Service (Medical): No    Lack of Transportation (Non-Medical): No  Physical Activity: Inactive (05/30/2022)   Exercise Vital Sign    Days of Exercise per Week: 0 days    Minutes of Exercise per Session: 0 min  Stress: No Stress Concern Present (05/30/2022)   [employer] of Occupational Health - Occupational Stress Questionnaire    Feeling of Stress : Not at all  Social Connections: Moderately Integrated (04/01/2021)   Social Connection and Isolation Panel    Frequency of Communication with Friends and Family: More than three times a week    Frequency of Social Gatherings with Friends and Family: More than three times a week    Attends Religious Services: More than 4 times per year    Active Member of [employer] or Organizations: Yes    Attends Banker Meetings: More than 4 times per year    Marital Status: Widowed  Intimate Partner Violence: Not At Risk (04/01/2021)   Humiliation, Afraid, Rape, and Kick questionnaire    Fear of Current or Ex-Partner: No    Emotionally Abused: No    Physically Abused: No    Sexually Abused: No    Review of Systems Constitutional: Patient denies any unintentional weight loss or change in strength lntegumentary: Patient denies any rashes or pruritus Cardiovascular: Patient denies chest pain or syncope Respiratory: Patient denies shortness of  breath Gastrointestinal: Patient denies nausea, vomiting, constipation, or diarrhea  Musculoskeletal: Patient denies muscle cramps or weakness Neurologic: Patient denies convulsions or seizures Allergic/Immunologic: Patient denies recent allergic reaction(s) Hematologic/Lymphatic: Patient denies bleeding tendencies Endocrine: Patient denies heat/cold intolerance  GU: As per HPI.  OBJECTIVE Vitals:   07/26/23 1027  BP: 127/75  Pulse: 88   There is no height or weight on file to calculate BMI.  Physical Examination Constitutional: No obvious distress; patient is non-toxic appearing  Cardiovascular: No visible lower extremity edema.  Respiratory: The patient does not have audible wheezing/stridor; respirations do not appear labored  Gastrointestinal: Abdomen non-distended Musculoskeletal: Normal ROM of UEs  Skin: No obvious rashes/open sores  Neurologic: CN 2-12 grossly intact  Psychiatric: Answered questions appropriately with normal affect  Hematologic/Lymphatic/Immunologic: No obvious bruises or sites of spontaneous bleeding  Urine microscopy: 6-10 WBC/hpf, 0-2 RBC/hpf, few bacteria PVR: 13 ml  ASSESSMENT Benign prostatic hyperplasia with urinary hesitancy - Plan: BLADDER SCAN AMB NON-IMAGING, Urinalysis, Routine w reflex microscopic  Phimosis - Plan: BLADDER SCAN AMB NON-IMAGING, Urinalysis, Routine w reflex microscopic  History of balanitis  Balanitis - Plan: clotrimazole  (LOTRIMIN ) 1 % cream  He is doing well. Will continue Myrbetriq  25 mg daily and Flomax  0.4 mg daily (as prescribed by his PCP). Clotrimazole  cream refill sent for PRN use. Advised gentle retraction of the foreskin as tolerated with daily cleansing using mild soap and water . We agreed to plan for follow up in 6 months or sooner if needed. Patient verbalized understanding of and agreement with current plan. All questions were answered.  PLAN Advised the following: 1. Continue Myrbetriq  25 mg daily. 2.  Continue Flomax  0.4 mg daily. 3. Clotrimazole  cream for PRN use.  4. Return in about 6 months (around 01/26/2024) for BPH, with UA & PVR.  Orders Placed This Encounter  Procedures   Urinalysis, Routine w reflex microscopic   BLADDER SCAN AMB NON-IMAGING    It has been explained that the patient is to follow regularly with their PCP in addition to all other providers involved in their care and to follow instructions provided by these respective offices. Patient advised to contact urology clinic if any urologic-pertaining questions, concerns, new symptoms or problems arise in the interim period.  There are no Patient Instructions on file for this visit.  Electronically signed by:  Lauraine JAYSON Oz, FNP   07/26/23    10:51 AM

## 2023-07-30 ENCOUNTER — Other Ambulatory Visit: Payer: Self-pay | Admitting: Medical

## 2023-08-06 ENCOUNTER — Other Ambulatory Visit: Payer: Self-pay | Admitting: Medical

## 2023-08-06 DIAGNOSIS — M79641 Pain in right hand: Secondary | ICD-10-CM

## 2023-09-18 ENCOUNTER — Telehealth: Payer: Self-pay | Admitting: "Endocrinology

## 2023-09-18 DIAGNOSIS — E212 Other hyperparathyroidism: Secondary | ICD-10-CM

## 2023-09-18 DIAGNOSIS — M8580 Other specified disorders of bone density and structure, unspecified site: Secondary | ICD-10-CM

## 2023-09-18 NOTE — Telephone Encounter (Signed)
Lab orders updated and sent to [place].

## 2023-09-18 NOTE — Telephone Encounter (Signed)
 Need labs updated, expires tomorrow and pt is either going tomorrow or Thursday

## 2023-09-19 ENCOUNTER — Ambulatory Visit: Admitting: "Endocrinology

## 2023-09-27 ENCOUNTER — Ambulatory Visit: Admitting: "Endocrinology

## 2023-10-02 DIAGNOSIS — E212 Other hyperparathyroidism: Secondary | ICD-10-CM | POA: Diagnosis not present

## 2023-10-02 DIAGNOSIS — M8580 Other specified disorders of bone density and structure, unspecified site: Secondary | ICD-10-CM | POA: Diagnosis not present

## 2023-10-04 LAB — PHOSPHORUS: Phosphorus: 3.1 mg/dL (ref 2.8–4.1)

## 2023-10-04 LAB — PTH, INTACT AND CALCIUM
Calcium: 9.8 mg/dL (ref 8.6–10.2)
PTH: 96 pg/mL — AB (ref 15–65)

## 2023-10-04 LAB — MAGNESIUM: Magnesium: 1.6 mg/dL (ref 1.6–2.3)

## 2023-10-09 ENCOUNTER — Ambulatory Visit: Admitting: "Endocrinology

## 2023-10-09 ENCOUNTER — Other Ambulatory Visit: Payer: Self-pay | Admitting: Medical

## 2023-10-12 ENCOUNTER — Other Ambulatory Visit: Payer: Self-pay | Admitting: Medical

## 2023-10-12 DIAGNOSIS — F32A Depression, unspecified: Secondary | ICD-10-CM

## 2023-10-12 NOTE — Telephone Encounter (Signed)
 Appt march 2026

## 2023-10-28 ENCOUNTER — Other Ambulatory Visit: Payer: Self-pay | Admitting: "Endocrinology

## 2023-11-08 ENCOUNTER — Encounter: Payer: Self-pay | Admitting: "Endocrinology

## 2023-11-08 ENCOUNTER — Ambulatory Visit: Admitting: "Endocrinology

## 2023-11-08 DIAGNOSIS — E213 Hyperparathyroidism, unspecified: Secondary | ICD-10-CM | POA: Diagnosis not present

## 2023-11-08 DIAGNOSIS — E212 Other hyperparathyroidism: Secondary | ICD-10-CM

## 2023-11-08 MED ORDER — CINACALCET HCL 30 MG PO TABS: ORAL_TABLET | ORAL | 3 refills | Status: AC

## 2023-11-08 NOTE — Progress Notes (Signed)
 11/08/2023, 1:00 PM  Endocrinology follow-up note  Scott Burton is a 88 y.o.-year-old male, returning for follow-up after he was seen in consultation for hypercalcemia/hyperparathyroidism.  He was initially referred by his PCP Tysinger, Alm RAMAN, PA-C .  He is accompanied by his son to clinic today.  He is currently on Sensipar  30 mg p.o. daily at breakfast.  His previsit labs show calcium  controlled at 9.8 with associated PTH of 9.6.     Past Medical History:  Diagnosis Date   Arthritis    CKD (chronic kidney disease), stage III (HCC)    GERD (gastroesophageal reflux disease)    Gout    Hyperlipidemia    Hypertension    RBBB     Past Surgical History:  Procedure Laterality Date   COLONOSCOPY N/A 04/19/2016   Procedure: COLONOSCOPY;  Surgeon: Margo LITTIE Haddock, MD;  [facility];  Service: Endoscopy;  Laterality: N/A;  1130    NO PAST SURGERIES      Social History   Tobacco Use   Smoking status: Former    Current packs/day: 0.00    Types: Cigarettes    Quit date: 01/17/1995    Years since quitting: 28.8    Passive exposure: Past   Smokeless tobacco: Former    Quit date: 01/28/1991  Vaping Use   Vaping status: Never Used  Substance Use Topics   Alcohol use: No   Drug use: No    Family History  Problem Relation Age of Onset   Cancer Father    Dementia Mother     Outpatient Encounter Medications as of 11/08/2023  Medication Sig   atorvastatin  (LIPITOR) 20 MG tablet Take 1 tablet (20 mg total) by mouth daily.   Cholecalciferol  (VITAMIN D3) 125 MCG (5000 UT) CAPS Take 1 capsule (5,000 Units total) by mouth daily.   cinacalcet  (SENSIPAR ) 30 MG tablet TAKE 1 TABLET EVERY DAY WITH BREAKFAST   clotrimazole  (LOTRIMIN ) 1 % cream Apply to affected area on head of the penis 2 times daily as needed.   diclofenac  Sodium (VOLTAREN ) 1 % GEL APPLY 4 GRAMS TOPICALLY 4 TIMES DAILY   linaclotide  (LINZESS ) 72 MCG capsule TAKE 1 CAPSULE EVERY DAY BEFORE BREAKFAST    losartan  (COZAAR ) 50 MG tablet Take 1 tablet (50 mg total) by mouth daily.   mirabegron  ER (MYRBETRIQ ) 25 MG TB24 tablet TAKE 1 TABLET EVERY DAY   nystatin  cream (MYCOSTATIN ) Apply 1 Application topically 2 (two) times daily.   PARoxetine  (PAXIL ) 10 MG tablet Take 1 tablet (10 mg total) by mouth daily.   polyethylene glycol powder (GLYCOLAX /MIRALAX ) 17 GM/SCOOP powder Take 17 g by mouth daily as needed.   tamsulosin  (FLOMAX ) 0.4 MG CAPS capsule TAKE 1 CAPSULE EVERY DAY   traZODone  (DESYREL ) 50 MG tablet TAKE 1/2 TABLET AT BEDTIME   [DISCONTINUED] cinacalcet  (SENSIPAR ) 30 MG tablet TAKE 1 TABLET EVERY DAY WITH BREAKFAST   No facility-administered encounter medications on file as of 11/08/2023.    No Known Allergies   HPI  Scott Burton was diagnosed with hypercalcemia at least since 2018.  Patient is not an optimal historian, with cognitive deficit accompanied by his son.   -He is tolerating his Sensipar  30 mg p.o. daily at breakfast.   He has no new complaints today.  His previsit labs are favorable with controlled hypercalcemia.  -He is not a surgical candidate for hyperparathyroidism.  -He is not taking any calcium  supplements. His bone density study is  consistent with osteopenia.  No prior history of fragility fractures or falls. No history of  kidney stones.  he is not on HCTZ or other thiazide therapy.  He has vitamin D  deficiency and supplement with cholecalciferol  1000 units daily.   -  he eats dairy and green, leafy, vegetables on average amounts.  he does not have a family history of hypercalcemia, pituitary tumors, thyroid  cancer, or osteoporosis.    ROS: Limited as above. PE: BP 116/62   Pulse 88   Ht 5' 6 (1.676 m)   Wt 191 lb 3.2 oz (86.7 kg)   BMI 30.86 kg/m , Body mass index is 30.86 kg/m. Wt Readings from Last 3 Encounters:  11/08/23 191 lb 3.2 oz (86.7 kg)  04/11/23 189 lb 9.6 oz (86 kg)  03/19/23 188 lb 12.8 oz (85.6 kg)       CMP ( most  recent) CMP     Component Value Date/Time   NA 142 04/11/2023 1439   K 4.5 04/11/2023 1439   CL 105 04/11/2023 1439   CO2 22 04/11/2023 1439   GLUCOSE 78 04/11/2023 1439   GLUCOSE 85 08/11/2020 0904   BUN 21 04/11/2023 1439   CREATININE 1.66 (H) 04/11/2023 1439   CREATININE 1.66 (H) 08/11/2020 0904   CALCIUM  9.8 10/02/2023 0851   PROT 7.0 04/11/2023 1439   ALBUMIN 4.2 04/11/2023 1439   AST 16 04/11/2023 1439   ALT 15 04/11/2023 1439   ALKPHOS 75 04/11/2023 1439   BILITOT 0.6 04/11/2023 1439   GFRNONAA 34 (L) 07/22/2020 1144   GFRAA 40 (L) 07/22/2020 1144     Lipid Panel ( most recent) Lipid Panel     Component Value Date/Time   CHOL 194 04/11/2023 1439   TRIG 189 (H) 04/11/2023 1439   HDL 41 04/11/2023 1439   CHOLHDL 4.7 04/11/2023 1439   CHOLHDL 4.3 03/18/2020 1021   VLDL 43 (H) 09/07/2016 0801   LDLCALC 120 (H) 04/11/2023 1439   LDLCALC 100 (H) 03/18/2020 1021   LDLDIRECT 130 (H) 11/05/2018 1159   LABVLDL 33 04/11/2023 1439      Lab Results  Component Value Date   TSH 2.910 03/15/2023   TSH 2.330 02/14/2022   TSH 2.510 10/29/2019   TSH 2.02 07/23/2019   TSH 1.89 12/04/2017   TSH 1.40 04/26/2015   FREET4 1.27 03/15/2023   FREET4 1.22 10/29/2019    November 13, 2019 bone density results: DualFemur Neck Left 11/05/2019 82.0 N/A -2.2 0.786 g/cm2   Left Forearm Radius 33% 11/05/2019 82.0 N/A -0.3 0.782 g/cm2 ASSESSMENT: BMD as determined from Femur Neck Left is 0.786 g/cm2 with a T-score of -2.2. This patient is considered osteopenic by World Healh Organization (WHO) Criteria. Lumbar spine was excluded due to advanced degenerative changes.    Assessment: 1. Hypercalcemia / Hyperparathyroidism 2.  Osteopenia 3.  Vitamin D  deficiency  Plan: His presentation has been consistent with mild, primary hyperparathyroidism.  He is not a surgical candidate for hyperparathyroidism.  He is responding to low-dose Sensipar  .  He has stage 3-4 CKD.   He is advised  to continue  Sensipar   30 mg p.o. every  day with breakfast with plan to repeat PTH/calcium  and office visit in 6 months.  Side effects and precautions discussed with him.      He has CKD and osteopenia.  He is advised to continue vitamin D  5000 units daily.  He is advised to maintain close follow-up with the nephrologist, PMD.  He is advised to  continue close follow-up with his PMD .   I spent  20  minutes in the care of the patient today including review of labs from Thyroid  Function, CMP, and other relevant labs ; imaging/biopsy records (current and previous including abstractions from other facilities); face-to-face time discussing  his lab results and symptoms, medications doses, his options of short and long term treatment based on the latest standards of care / guidelines;   and documenting the encounter.  Elsie JONELLE Blush  participated in the discussions, expressed understanding, and voiced agreement with the above plans.  All questions were answered to his satisfaction. he is encouraged to contact clinic should he have any questions or concerns prior to his return visit.   - Return in about 6 months (around 05/08/2024) for F/U with Pre-visit Labs.   Ranny Earl, MD [facility] [address] [place], [place] [address] Phone: [phone]  Fax: [phone]    This note was partially dictated with voice recognition software. Similar sounding words can be transcribed inadequately or may not  be corrected upon review.  11/08/2023, 1:00 PM

## 2023-11-20 ENCOUNTER — Telehealth: Payer: Self-pay | Admitting: Internal Medicine

## 2023-11-20 NOTE — Telephone Encounter (Signed)
 Daughter is coming in next week with him to discuss memory issues   Copied from CRM [id number]. Topic: Clinical - Medical Advice >> Nov 20, 2023  2:16 PM Teressa P wrote: Reason for CRM: patients granddaughter Jesusa called saying she would like to talk to a nurse about her grandfathers dementia and if that is what is going on .  CB@  [phone]

## 2023-11-27 ENCOUNTER — Ambulatory Visit: Admitting: Medical

## 2023-11-30 ENCOUNTER — Ambulatory Visit: Admitting: Medical

## 2023-11-30 VITALS — BP 110/62 | HR 68 | Wt 191.0 lb

## 2023-11-30 DIAGNOSIS — R4189 Other symptoms and signs involving cognitive functions and awareness: Secondary | ICD-10-CM | POA: Diagnosis not present

## 2023-11-30 DIAGNOSIS — F32A Depression, unspecified: Secondary | ICD-10-CM | POA: Diagnosis not present

## 2023-11-30 DIAGNOSIS — E212 Other hyperparathyroidism: Secondary | ICD-10-CM

## 2023-11-30 DIAGNOSIS — N183 Chronic kidney disease, stage 3 unspecified: Secondary | ICD-10-CM

## 2023-11-30 DIAGNOSIS — R531 Weakness: Secondary | ICD-10-CM

## 2023-11-30 DIAGNOSIS — F419 Anxiety disorder, unspecified: Secondary | ICD-10-CM | POA: Diagnosis not present

## 2023-11-30 DIAGNOSIS — I10 Essential (primary) hypertension: Secondary | ICD-10-CM

## 2023-11-30 DIAGNOSIS — R7301 Impaired fasting glucose: Secondary | ICD-10-CM

## 2023-11-30 NOTE — Progress Notes (Signed)
 Subjective:  Scott Burton is a 88 y.o. male who presents for Chief Complaint  Patient presents with   Consult    Memory issues. Granddaughter-Scott Burton. Never been dx with memory issues.  Feels like your not eating right.      Here with granddaughter Scott Burton today.   Family is worried about memory, weakness.   Scott Burton is an 88 year old male who presents with memory loss and weakness. He is accompanied by his granddaughter, who is concerned about his memory and weakness.  He has been experiencing progressive memory loss, leading to safety concerns. His granddaughter notes frequent forgetfulness, including names of family members and destinations while driving, resulting in him ceasing to drive. He has also left the stove on, prompting him to stop using it for safety reasons.  He reports weakness, which his granddaughter attributes to inadequate nutrition. He is not consuming regular meals. He lives with his uncle, uncle's wife, and their son, but lacks consistent supervision, raising safety concerns.  He is currently taking Paxil  and Trazodone . To maintain his weight, he consumes Ensure two to three times daily, and his weight has remained stable.  His granddaughter mentions that he has not seen a neurologist. There was a consideration of depression following the passing of his mother in the past, which may have contributed to his current condition. He had an abnormal parathyroid lab earlier this year, potentially related to kidney function, and is under the care of an endocrinologist.  No recent hallucinations, paranoia, or acute changes in behavior.  No other aggravating or relieving factors.    No other c/o.  Past Medical History:  Diagnosis Date   Arthritis    CKD (chronic kidney disease), stage III (HCC)    GERD (gastroesophageal reflux disease)    Gout    Hyperlipidemia    Hypertension    RBBB    Current Outpatient Medications on File Prior to Visit  Medication Sig  Dispense Refill   atorvastatin  (LIPITOR) 20 MG tablet Take 1 tablet (20 mg total) by mouth daily. 90 tablet 2   Cholecalciferol  (VITAMIN D3) 125 MCG (5000 UT) CAPS Take 1 capsule (5,000 Units total) by mouth daily. 90 capsule 1   cinacalcet  (SENSIPAR ) 30 MG tablet TAKE 1 TABLET EVERY DAY WITH BREAKFAST 90 tablet 3   clotrimazole  (LOTRIMIN ) 1 % cream Apply to affected area on head of the penis 2 times daily as needed. 15 g 0   diclofenac  Sodium (VOLTAREN ) 1 % GEL APPLY 4 GRAMS TOPICALLY 4 TIMES DAILY 400 g 0   linaclotide  (LINZESS ) 72 MCG capsule TAKE 1 CAPSULE EVERY DAY BEFORE BREAKFAST 90 capsule 1   losartan  (COZAAR ) 50 MG tablet Take 1 tablet (50 mg total) by mouth daily. 90 tablet 1   mirabegron  ER (MYRBETRIQ ) 25 MG TB24 tablet TAKE 1 TABLET EVERY DAY 90 tablet 1   nystatin  cream (MYCOSTATIN ) Apply 1 Application topically 2 (two) times daily. 30 g 0   PARoxetine  (PAXIL ) 10 MG tablet Take 1 tablet (10 mg total) by mouth daily. 90 tablet 2   polyethylene glycol powder (GLYCOLAX /MIRALAX ) 17 GM/SCOOP powder Take 17 g by mouth daily as needed. 3350 g 1   tamsulosin  (FLOMAX ) 0.4 MG CAPS capsule TAKE 1 CAPSULE EVERY DAY 90 capsule 1   traZODone  (DESYREL ) 50 MG tablet TAKE 1/2 TABLET AT BEDTIME 45 tablet 3   No current facility-administered medications on file prior to visit.     The following portions of the patient's history were  reviewed and updated as appropriate: allergies, current medications, past family history, past medical history, past social history, past surgical history and problem list.  ROS Otherwise as in subjective above    Objective: BP 110/62   Pulse 68   Wt 191 lb (86.6 kg)   SpO2 98%   BMI 30.83 kg/m   Wt Readings from Last 3 Encounters:  11/30/23 191 lb (86.6 kg)  11/08/23 191 lb 3.2 oz (86.7 kg)  04/11/23 189 lb 9.6 oz (86 kg)    General appearance: alert, no distress, well developed, well nourished Neck: supple, no lymphadenopathy, no thyromegaly, no  masses Heart: RRR, normal S1, S2, no murmurs Lungs: CTA bilaterally, no wheezes, rhonchi, or rales Pulses: 2+ radial pulses, 1+ pedal pulses, normal cap refill Ext: no edema Neuro: CN II through XII intact, somewhat hard of hearing, seems little unsteady when he first stands up but then okay walking without ataxia, negative Romberg, normal upper and lower extremity strength Psych: Pleasant, alert, oriented x 2    Assessment: Encounter Diagnoses  Name Primary?   Concern about memory Yes   Cognitive change    Anxiety and depression    Stage 3 chronic kidney disease, unspecified whether stage 3a or 3b CKD (HCC)    Other hyperparathyroidism    Impaired fasting blood sugar    Essential hypertension, benign    Weakness      Plan: We discussed concerns about memory.  MMSE 15/29.  We discussed home situation and family requests.  Advised referral to neurology for formal evaluation and other treatment options for likely worsening memory issues  Referral to social work to see if there is other services that could potentially be utilized  Continue follow-up with endocrinology regarding abnormal parathyroid  Overall mood is okay.  Continue paroxetine  Paxil  10 mg for now.  Continue trazodone  as needed for sleep.  He does not use this every day but occasionally.  Constipation-continue Linzess  as needed.  For now continue Lipitor  CKD 3-continue periodic monitoring  We discussed short-term goals to have family ensure that he is eating at least twice a day and that he is safe.  Right now he does not have 24/7 supervision but they are working on this.  We discussed family getting together to discuss overall finances, we will and healthcare advanced directives, and trying to make sure family is in agreement on moving forward with those issues.  We discussed at some point they may need to consider nursing facility if no one at home can handle ADLs, bathing, supervision for example.  He can  bath himself and feed himself, but not safe to operate stove, drive car, probably not in best position to handle his own finances at this point    Caliber was seen today for consult.  Diagnoses and all orders for this visit:  Concern about memory -     Ambulatory referral to Neurology -     AMB Referral [facility] Care Management -     TSH + free T4 -     Comprehensive metabolic panel with GFR -     CBC -     HIV Antibody (routine testing w rflx) -     RPR -     Hepatitis C antibody -     Hepatitis B surface antigen -     Vitamin B12  Cognitive change -     Ambulatory referral to Neurology -     AMB Referral [facility] Care Management -  TSH + free T4 -     Comprehensive metabolic panel with GFR -     CBC -     HIV Antibody (routine testing w rflx) -     RPR -     Hepatitis C antibody -     Hepatitis B surface antigen -     Vitamin B12  Anxiety and depression -     Ambulatory referral to Neurology -     AMB Referral [facility] Care Management  Stage 3 chronic kidney disease, unspecified whether stage 3a or 3b CKD (HCC)  Other hyperparathyroidism  Impaired fasting blood sugar  Essential hypertension, benign  Weakness -     TSH + free T4 -     Comprehensive metabolic panel with GFR -     CBC -     Vitamin B12    Follow up: pending referrals, labs

## 2023-11-30 NOTE — Progress Notes (Signed)
 Placed referral

## 2023-12-01 LAB — COMPREHENSIVE METABOLIC PANEL WITH GFR
ALT: 13 IU/L (ref 0–44)
AST: 16 IU/L (ref 0–40)
Albumin: 4.2 g/dL (ref 3.7–4.7)
Alkaline Phosphatase: 76 IU/L (ref 48–129)
BUN/Creatinine Ratio: 9 — ABNORMAL LOW (ref 10–24)
BUN: 17 mg/dL (ref 8–27)
Bilirubin Total: 0.4 mg/dL (ref 0.0–1.2)
CO2: 22 mmol/L (ref 20–29)
Calcium: 9.3 mg/dL (ref 8.6–10.2)
Chloride: 104 mmol/L (ref 96–106)
Creatinine, Ser: 1.8 mg/dL — ABNORMAL HIGH (ref 0.76–1.27)
Globulin, Total: 2.4 g/dL (ref 1.5–4.5)
Glucose: 86 mg/dL (ref 70–99)
Potassium: 4.6 mmol/L (ref 3.5–5.2)
Sodium: 141 mmol/L (ref 134–144)
Total Protein: 6.6 g/dL (ref 6.0–8.5)
eGFR: 36 mL/min/1.73 — ABNORMAL LOW (ref >59)

## 2023-12-01 LAB — VITAMIN B12: Vitamin B-12: 691 pg/mL (ref 232–1245)

## 2023-12-01 LAB — CBC
Hematocrit: 36.1 % — ABNORMAL LOW (ref 37.5–51.0)
Hemoglobin: 11.5 g/dL — ABNORMAL LOW (ref 13.0–17.7)
MCH: 28.9 pg (ref 26.6–33.0)
MCHC: 31.9 g/dL (ref 31.5–35.7)
MCV: 91 fL (ref 79–97)
Platelets: 203 x10E3/uL (ref 150–450)
RBC: 3.98 x10E6/uL — ABNORMAL LOW (ref 4.14–5.80)
RDW: 12.3 % (ref 11.6–15.4)
WBC: 6.3 x10E3/uL (ref 3.4–10.8)

## 2023-12-01 LAB — TSH+FREE T4
Free T4: 1.29 ng/dL (ref 0.82–1.77)
TSH: 2.43 u[IU]/mL (ref 0.450–4.500)

## 2023-12-01 LAB — RPR: RPR Ser Ql: NONREACTIVE

## 2023-12-01 LAB — HIV ANTIBODY (ROUTINE TESTING W REFLEX): HIV Screen 4th Generation wRfx: NONREACTIVE

## 2023-12-01 LAB — HEPATITIS C ANTIBODY: Hep C Virus Ab: NONREACTIVE

## 2023-12-01 LAB — HEPATITIS B SURFACE ANTIGEN: Hepatitis B Surface Ag: NEGATIVE

## 2023-12-02 ENCOUNTER — Ambulatory Visit: Payer: Self-pay | Admitting: Medical

## 2023-12-02 ENCOUNTER — Other Ambulatory Visit: Payer: Self-pay | Admitting: Medical

## 2023-12-02 DIAGNOSIS — N183 Chronic kidney disease, stage 3 unspecified: Secondary | ICD-10-CM

## 2023-12-02 NOTE — Progress Notes (Signed)
 Labs shows mild anemia, thyroid  okay, B12 normal.  Kidney marker abnormal , a little worse than prior  Continue plan with referral to neurology and social work  New referral to kidney doctor as well given the worsening of kidney function

## 2023-12-05 ENCOUNTER — Telehealth: Payer: Self-pay

## 2023-12-05 NOTE — Progress Notes (Signed)
 Complex Care Management Note Care Guide Note  12/05/2023 Name: Scott Burton MRN: [id number] DOB: December 18, 1935   Complex Care Management Outreach Attempts: An unsuccessful telephone outreach was attempted today to offer the patient information about available complex care management services.  Follow Up Plan:  Additional outreach attempts will be made to offer the patient complex care management information and services.   Encounter Outcome:  No Answer  Jeoffrey Buffalo , RMA     [place]  [facility], [facility] Guide  Direct Dial: [phone]  Website: [email or web address]

## 2023-12-06 ENCOUNTER — Ambulatory Visit: Payer: Self-pay

## 2023-12-07 NOTE — Progress Notes (Signed)
 Complex Care Management Note Care Guide Note  12/07/2023 Name: Scott Burton MRN: [id number] DOB: [birth year]   Complex Care Management Outreach Attempts: A second unsuccessful outreach was attempted today to offer the patient with information about available complex care management services.  Follow Up Plan:  Additional outreach attempts will be made to offer the patient complex care management information and services.   Encounter Outcome:  No Answer  Jeoffrey Buffalo , RMA     [place]  [facility], [facility] Guide  Direct Dial: [phone]  Website: [email or web address]

## 2023-12-14 NOTE — Progress Notes (Signed)
 Complex Care Management Note  Care Guide Note 12/14/2023 Name: KACI FREEL MRN: [id number] DOB: [birth year]  BREON REHM is a 88 y.o. year old male who sees Tysinger, Alm RAMAN, PA-C for primary care. I reached out to Elsie JONELLE Blush by phone today to offer complex care management services.  Mr. Delcarlo was given information about Complex Care Management services today including:   The Complex Care Management services include support from the care team which includes your Nurse Care Manager, Clinical Social Worker, or Pharmacist.  The Complex Care Management team is here to help remove barriers to the health concerns and goals most important to you. Complex Care Management services are voluntary, and the patient may decline or stop services at any time by request to their care team member.   Complex Care Management Consent Status: Patient agreed to services and verbal consent obtained.   Follow up plan:  Telephone appointment with complex care management team member scheduled for:  01/08/2024  Encounter Outcome:  Patient Scheduled  .Debbe Fuse [facility], [facility] Guide  Direct Dial: [phone]  Fax [phone]

## 2023-12-24 ENCOUNTER — Other Ambulatory Visit: Payer: Self-pay | Admitting: Medical

## 2023-12-27 ENCOUNTER — Encounter: Payer: Self-pay | Admitting: Neurology

## 2023-12-27 ENCOUNTER — Ambulatory Visit: Admitting: Neurology

## 2023-12-27 VITALS — BP 144/79 | HR 85 | Ht 66.0 in | Wt 190.0 lb

## 2023-12-27 DIAGNOSIS — G479 Sleep disorder, unspecified: Secondary | ICD-10-CM | POA: Diagnosis not present

## 2023-12-27 DIAGNOSIS — F01B0 Vascular dementia, moderate, without behavioral disturbance, psychotic disturbance, mood disturbance, and anxiety: Secondary | ICD-10-CM

## 2023-12-27 DIAGNOSIS — Z9189 Other specified personal risk factors, not elsewhere classified: Secondary | ICD-10-CM

## 2023-12-27 NOTE — Patient Instructions (Signed)
 It was nice to meet you today.  You have complaints of memory loss: memory loss or changes in cognitive function can have many reasons and does not always mean you have dementia.  There are several conditions and situations that can contribute to subjective or objective memory loss.  These factors include: depression, stress, sleep deprivation or poor sleep from insomnia or sleep apnea, dehydration, fluctuation in blood sugar values, thyroid  or electrolyte dysfunction, medication effects from sedating medications or narcotic pain medication for example and certain vitamin deficiencies such as vitamin B12 deficiency, and anemia. Dementia can be caused by stroke, brain atherosclerosis or brain vascular disease due to vascular risk factors (smoking, high blood pressure, high cholesterol, obesity and uncontrolled diabetes), certain degenerative brain disorders (including Parkinson's disease and Multiple sclerosis) and by Alzheimer's disease or other, more rare and sometimes hereditary causes.   Here is what I would recommend:   You already had recent blood work (through your primary care) and I do not believe we need to add any new blood test quite yet.  We will do a brain scan, called MRI and call you with the test results. We will have to schedule you for this on a separate date. This test requires authorization from your insurance, and we will take care of the insurance process. I do not recommend any new medications for your memory loss because medications can have side effects.  I would like for you to focus on maintaining a healthy lifestyle, good nutrition, getting enough sleep.   I am not sure that you are completely safe to drive any longer.  Please talk to your primary care about this as well as discussed with your family if you could give up driving altogether.  I would recommend this.   I recommend we proceed with a home sleep test to evaluate you for an underlying sleep disorder such as obstructive  sleep apnea.  If you have obstructive sleep apnea I will likely recommend treatment with a CPAP or AutoPap machine. We will plan a follow-up in this clinic in about 3 months.  Please try to continue to hydrate well with water , drink about 3-4 bottles of water  per day, 16.9 ounce size each.

## 2023-12-27 NOTE — Progress Notes (Signed)
 Subjective:    Patient ID: Scott Burton is a 88 y.o. male.  HPI    True Mar, MD, PhD [facility] [address], [address] [address] [place], [place] [address]  Dear Ludie,   I saw your patient, Scott Burton, upon your kind request in my neurologic clinic today for evaluation of his memory loss.  The patient is accompanied by his granddaughter, Scott Burton today.  As you know, Scott Burton is an 88 year old male with an underlying medical history of chronic kidney disease, reflux disease, gout, hypertension, hyperlipidemia, arthritis, anxiety, depression, right bundle branch block, and obesity, who reports being forgetful for the past few months.  Symptoms have been ongoing for the past 6 to 12 months per granddaughter.  She supplements his history.  He has a family history of memory loss affecting his sister who is 64 years old and his mom apparently also had dementia.  His own family consisted of wife and 5 kids.  He was married for over 50 years but lost his wife in 2009.  He then lost one of his daughters in 2016 at the age of 53 from breast cancer and he lost 1 son at the age of 68 from stomach cancer in 2022.  He does not sleep well and started taking trazodone  when he lost his son but does not take it every night.  He is still talking about his lost family members a lot according to his granddaughter.  He currently lives with his youngest son, daughter-in-law and 45 year old grandson.  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  No one has really sat with him to monitor his driving according to [facility] and she would favor for him to give up driving as well.  He has not fallen recently.  He denies any strokelike symptoms.  He is trying to hydrate better with water  and they estimate that he drinks about 3 bottles of water  per day, 16.9 ounce size each.  He quit smoking over 20 years ago and quit drinking alcohol over 30 years  ago.  He drinks no caffeine daily.  He is not sure if he snores.  He has never had a sleep study. I reviewed your office note from 11/30/2023.  His MMSE was 15 at the time.  He has been on Paxil  for anxiety.   He got married at age 65, his wife was 26.  He worked on a tobacco farm.  He had 5 children altogether, 3 alive, 2 daughters and 1 son, the one he lives with.  His daughters live close by, 1 is about 10 minutes away, the other is about 15 minutes away.  She now lives about 15 minutes away as well.  If he needs to go to an appointment or longer distance, someone drives him.  He had a head CT without contrast on 06/11/2016 through the emergency department at [facility] for indication of dizziness and I reviewed the results:   IMPRESSION: Atrophic changes without acute abnormality.     He had a head CT without contrast on 08/06/2014 with indication of dizziness and confusion, this was done through any [facility] ED.  I reviewed the results:  IMPRESSION: No acute intracranial abnormality is noted. Mild atrophic changes are seen.  He had blood work through your office on 11/30/2023 and I reviewed test results through his electronic chart.  TSH was normal at 2.43, CMP showed BUN of  17, creatinine 1.8, GFR below 40, liver enzymes normal.  CBC showed hemoglobin below normal at 11.5, hematocrit below normal at 36.1, RBC below normal at 3.98, HIV nonreactive, RPR nonreactive, hepatitis C antibody nonreactive, B12 691.  He was referred to social work.  His Past Medical History Is Significant For: Past Medical History:  Diagnosis Date   Arthritis    CKD (chronic kidney disease), stage III (HCC)    GERD (gastroesophageal reflux disease)    Gout    Hyperlipidemia    Hypertension    RBBB     His Past Surgical History Is Significant For: Past Surgical History:  Procedure Laterality Date   COLONOSCOPY N/A 04/19/2016   Procedure: COLONOSCOPY;  Surgeon: Margo LITTIE Haddock, MD;  [facility];  Service: Endoscopy;  Laterality: N/A;  1130    NO PAST SURGERIES      His Family History Is Significant For: Family History  Problem Relation Age of Onset   Dementia Mother    Cancer Father    Alzheimer's disease Sister     His Social History Is Significant For: Social History   Socioeconomic History   Marital status: Widowed    Spouse name: Not on file   Number of children: 5   Years of education: 86   Highest education level: 12th grade  Occupational History   Not on file  Tobacco Use   Smoking status: Former    Current packs/day: 0.00    Types: Cigarettes    Quit date: 01/17/1995    Years since quitting: 28.9    Passive exposure: Past   Smokeless tobacco: Former    Quit date: 01/28/1991  Vaping Use   Vaping status: Never Used  Substance and Sexual Activity   Alcohol use: No   Drug use: No   Sexual activity: Not Currently  Other Topics Concern   Not on file  Social History Narrative   5 children, 3 children living, 12 grandchildren, 15 great grandchildren.   Wife died in 2007/01/26.      Pt lives with family    Retired    Social Drivers of Health   Tobacco Use: Medium Risk (12/27/2023)   Patient History    Smoking Tobacco Use: Former    Smokeless Tobacco Use: Former    Passive Exposure: Past  Physicist, Medical Strain: Low Risk (05/30/2022)   Overall Financial Resource Strain (CARDIA)    Difficulty of Paying Living Expenses: Not hard at all  Food Insecurity: No Food Insecurity (05/30/2022)   Hunger Vital Sign    Worried About Running Out of Food in the Last Year: Never true    Ran Out of Food in the Last Year: Never true  Transportation Needs: No Transportation Needs (05/30/2022)   PRAPARE - Administrator, Civil Service (Medical): No    Lack of Transportation (Non-Medical): No  Physical Activity: Inactive (05/30/2022)   Exercise Vital Sign    Days of Exercise per Week: 0 days    Minutes of Exercise per Session: 0 min  Stress: No  Stress Concern Present (05/30/2022)   [employer] of Occupational Health - Occupational Stress Questionnaire    Feeling of Stress : Not at all  Social Connections: Moderately Integrated (04/01/2021)   Social Connection and Isolation Panel    Frequency of Communication with Friends and Family: More than three times a week    Frequency of Social Gatherings with Friends and Family: More than three times a week  Attends Religious Services: More than 4 times per year    Active Member of Clubs or Organizations: Yes    Attends Banker Meetings: More than 4 times per year    Marital Status: Widowed  Depression (PHQ2-9): Low Risk (04/11/2023)   Depression (PHQ2-9)    PHQ-2 Score: 0  Alcohol Screen: Low Risk (04/01/2021)   Alcohol Screen    Last Alcohol Screening Score (AUDIT): 0  Housing: Low Risk (05/30/2022)   Housing    Last Housing Risk Score: 0  Utilities: Not on file  Health Literacy: Not on file    His Allergies Are:  Allergies[1]:   His Current Medications Are:  Outpatient Encounter Medications as of 12/27/2023  Medication Sig   atorvastatin  (LIPITOR) 20 MG tablet Take 1 tablet (20 mg total) by mouth daily.   Cholecalciferol  (VITAMIN D3) 125 MCG (5000 UT) CAPS Take 1 capsule (5,000 Units total) by mouth daily.   cinacalcet  (SENSIPAR ) 30 MG tablet TAKE 1 TABLET EVERY DAY WITH BREAKFAST   clotrimazole  (LOTRIMIN ) 1 % cream Apply to affected area on head of the penis 2 times daily as needed.   diclofenac  Sodium (VOLTAREN ) 1 % GEL APPLY 4 GRAMS TOPICALLY 4 TIMES DAILY   LINZESS  72 MCG capsule TAKE 1 CAPSULE EVERY DAY BEFORE BREAKFAST   losartan  (COZAAR ) 50 MG tablet Take 1 tablet (50 mg total) by mouth daily.   mirabegron  ER (MYRBETRIQ ) 25 MG TB24 tablet TAKE 1 TABLET EVERY DAY   nystatin  cream (MYCOSTATIN ) Apply 1 Application topically 2 (two) times daily.   PARoxetine  (PAXIL ) 10 MG tablet Take 1 tablet (10 mg total) by mouth daily.   polyethylene glycol powder  (GLYCOLAX /MIRALAX ) 17 GM/SCOOP powder Take 17 g by mouth daily as needed.   tamsulosin  (FLOMAX ) 0.4 MG CAPS capsule TAKE 1 CAPSULE EVERY DAY   traZODone  (DESYREL ) 50 MG tablet TAKE 1/2 TABLET AT BEDTIME   No facility-administered encounter medications on file as of 12/27/2023.  :   Review of Systems:  Out of a complete 14 point review of systems, all are reviewed and negative with the exception of these symptoms as listed below:          Review of Systems  Objective:  Neurological Exam  Physical Exam Physical Examination:   Vitals:   12/27/23 1530  BP: (!) 144/79  Pulse: 85    General Examination: The patient is a very pleasant 88 y.o. male in no acute distress. He appears well-developed and well-nourished and well groomed.  Good spirits.  HEENT: Normocephalic, atraumatic, pupils are equal, round and reactive to light, extraocular tracking is good without limitation to gaze excursion or nystagmus noted. No photophobia.  Corrective eye glasses in place. Hearing is mildly impaired. Face is symmetric with normal facial animation. Speech is clear without dysarthria. There is no hypophonia. There is no lip, neck/head, jaw or voice tremor. Neck is supple with full range of passive and active motion. There are no carotid bruits on auscultation.  Airway/Oropharynx exam reveals: mild mouth dryness, full dentures in place.  Moderate airway crowding.  Tip of uvula and tonsils not fully visualized.  Mallampati class IV.    Chest: Clear to auscultation without wheezing, rhonchi or crackles noted.  Heart: S1+S2+0, regular and normal without murmurs, rubs or gallops noted.   Abdomen: Soft, non-tender and non-distended.  Extremities: There is no pitting edema in the distal lower extremities bilaterally.   Skin: Warm and dry without trophic changes noted.   Musculoskeletal: exam reveals no  obvious joint deformities.   Neurologically:  Mental status: The patient is awake, alert and  oriented in all 4 spheres. His immediate and remote memory, attention, language skills and fund of knowledge are impaired.  He is able to answer simple questions appropriately.  History is supplemented by his granddaughter.   Thought process is linear. Mood is normal and affect is normal.  Cranial nerves II - XII are as described above under HEENT exam.  Motor exam: Normal bulk, strength and tone is noted. There is no obvious action or resting tremor.  Reflexes trace in the upper extremities and diminished in the lower extremities bilaterally. Fine motor skills and coordination: Intact grossly for age.  Cerebellar testing: No dysmetria or intention tremor. There is no truncal or gait ataxia.  Sensory exam: intact to light touch in the upper and lower extremities.  Gait, station and balance: He stands without difficulty and posture is age-appropriate, mildly stooped.  He walks without a walking aid.    Assessment and Plan:   In summary, Scott Burton is an 88 year old male with an underlying medical history of chronic kidney disease, reflux disease, gout, hypertension, hyperlipidemia, arthritis, anxiety, depression, right bundle branch block, and obesity, who  Presents for evaluation of his memory loss of several months duration.  He reports a family history of dementia affecting his mom and sister.  While he may be at risk for Alzheimer's dementia, his medical history puts him at higher risk for vascular dementia and given that his MMSE a month ago was 15/30, he is probably in the moderate range of vascular dementia without any obvious behavioral changes or mood disorder.  I had a long discussion with the patient and his granddaughter today regarding his risk factors and further evaluation and possible management.  For now, they are agreeable to focusing more on healthy lifestyle, nutrition, getting good support, keeping him content and safe.  I do not believe he is fully safe to drive based on his  history, his memory scores and the fact that he appears to be hard of hearing.  They are encouraged to discuss this as a family.   This was an extended visit of over 60 minutes with copious record review involved and considerable counseling and coordination of care.  Below is a summary of my recommendations and our discussion points from today's visit, based on chart review, history and examination. They were given these instructions verbally during the visit in detail and also in writing in the MyChart after visit summary (AVS), which they can access electronically. << You already had recent blood work (through your primary care) and I do not believe we need to add any new blood test quite yet.  We will do a brain scan, called MRI and call you with the test results. We will have to schedule you for this on a separate date. This test requires authorization from your insurance, and we will take care of the insurance process. I do not recommend any new medications for your memory loss because medications can have side effects.  I would like for you to focus on maintaining a healthy lifestyle, good nutrition, getting enough sleep.   I am not sure that you are completely safe to drive any longer.  Please talk to your primary care about this as well as discussed with your family if you could give up driving altogether.  I would recommend this.   I recommend we proceed with a home sleep test to evaluate  you for an underlying sleep disorder such as obstructive sleep apnea.  If you have obstructive sleep apnea I will likely recommend treatment with a CPAP or AutoPap machine. We will plan a follow-up in this clinic in about 3 months.  Please try to continue to hydrate well with water , drink about 3-4 bottles of water  per day, 16.9 ounce size each.  >>    Thank you very much for allowing me to participate in the care of this nice patient. If I can be of any further assistance to you please do not hesitate to call  me at [phone].  Sincerely,   True Mar, MD, PhD     [1] No Known Allergies

## 2023-12-30 ENCOUNTER — Other Ambulatory Visit: Payer: Self-pay | Admitting: Medical

## 2024-01-08 ENCOUNTER — Telehealth: Payer: Self-pay | Admitting: Licensed Clinical Social Worker

## 2024-01-08 ENCOUNTER — Encounter: Payer: Self-pay | Admitting: Licensed Clinical Social Worker

## 2024-01-08 ENCOUNTER — Encounter: Payer: Self-pay | Admitting: Neurology

## 2024-01-08 NOTE — Patient Instructions (Signed)
 Scott Burton - I am sorry I was unable to reach you today for our scheduled appointment. I work with Environmental Health Practitioner, Alm RAMAN, PA-C and am calling to support your healthcare needs. Please contact me at [phone] at your earliest convenience. I look forward to speaking with you soon.   Thank you,  Rolin Kerns, LCSW [place]  [facility], [facility] Clinical Social Worker Direct Dial: [phone]  Fax: [phone] Website: [email or web address] 2:10 PM

## 2024-01-09 ENCOUNTER — Telehealth: Admitting: Family Medicine

## 2024-01-09 DIAGNOSIS — J029 Acute pharyngitis, unspecified: Secondary | ICD-10-CM | POA: Diagnosis not present

## 2024-01-09 MED ORDER — AMOXICILLIN 500 MG PO CAPS: 500.0000 mg | ORAL_CAPSULE | Freq: Two times a day (BID) | ORAL | 0 refills | Status: AC

## 2024-01-09 NOTE — Progress Notes (Signed)

## 2024-01-20 ENCOUNTER — Inpatient Hospital Stay: Admission: RE | Admit: 2024-01-20 | Source: Ambulatory Visit

## 2024-01-28 ENCOUNTER — Ambulatory Visit: Admitting: Urology

## 2024-02-04 ENCOUNTER — Other Ambulatory Visit: Payer: Self-pay | Admitting: Medical

## 2024-02-04 MED ORDER — TAMSULOSIN HCL 0.4 MG PO CAPS: 0.4000 mg | ORAL_CAPSULE | Freq: Every day | ORAL | 1 refills | Status: DC

## 2024-02-04 NOTE — Telephone Encounter (Signed)
 Ok to refill

## 2024-02-07 ENCOUNTER — Ambulatory Visit

## 2024-02-07 DIAGNOSIS — F01B0 Vascular dementia, moderate, without behavioral disturbance, psychotic disturbance, mood disturbance, and anxiety: Secondary | ICD-10-CM

## 2024-02-07 DIAGNOSIS — Z9189 Other specified personal risk factors, not elsewhere classified: Secondary | ICD-10-CM

## 2024-02-07 DIAGNOSIS — G479 Sleep disorder, unspecified: Secondary | ICD-10-CM

## 2024-02-11 ENCOUNTER — Other Ambulatory Visit (HOSPITAL_COMMUNITY): Payer: Self-pay | Admitting: Nephrology

## 2024-02-11 DIAGNOSIS — N1832 Chronic kidney disease, stage 3b: Secondary | ICD-10-CM

## 2024-02-11 DIAGNOSIS — I129 Hypertensive chronic kidney disease with stage 1 through stage 4 chronic kidney disease, or unspecified chronic kidney disease: Secondary | ICD-10-CM

## 2024-02-12 ENCOUNTER — Telehealth: Payer: Self-pay | Admitting: Medical

## 2024-02-12 ENCOUNTER — Encounter: Payer: Self-pay | Admitting: Medical

## 2024-02-12 DIAGNOSIS — F01B0 Vascular dementia, moderate, without behavioral disturbance, psychotic disturbance, mood disturbance, and anxiety: Secondary | ICD-10-CM | POA: Insufficient documentation

## 2024-02-12 NOTE — Telephone Encounter (Signed)
 Pt dropped off forms fro [employer]  to be completed  forms sent back in folder    call when ready Pt requesting  ASAP

## 2024-02-13 NOTE — Telephone Encounter (Signed)
 Left message for pt's granddaughter Jesusa call me back  Trying to find out the following questions.  1- what kind of special treatment he needs? Such as MACE, In and out Catheters, regularly prescribed enemas or digital stimulation, vagus nerve stimulation swipe, suctioning? 2- To determine to meet a level of care, does he require a need for any of the following?: such  as need for PT, Speech therapy, Specialize Therapeutic Diet, Controlled medication, respiratory therapy, occupational therapy, dialysis, any IV drug administration, frequent drug injections for medications, or any nasogastric feeding?

## 2024-02-13 NOTE — Telephone Encounter (Signed)
 See my chart message

## 2024-02-14 ENCOUNTER — Other Ambulatory Visit: Payer: Self-pay | Admitting: Medical

## 2024-02-14 DIAGNOSIS — F32A Depression, unspecified: Secondary | ICD-10-CM

## 2024-02-14 NOTE — Telephone Encounter (Signed)
 Is patient suppose to be on both medications at the same time?

## 2024-02-15 ENCOUNTER — Ambulatory Visit (HOSPITAL_COMMUNITY)
Admission: RE | Admit: 2024-02-15 | Discharge: 2024-02-15 | Disposition: A | Source: Ambulatory Visit | Attending: Nephrology | Admitting: Nephrology

## 2024-02-15 DIAGNOSIS — N1832 Chronic kidney disease, stage 3b: Secondary | ICD-10-CM | POA: Diagnosis present

## 2024-02-15 DIAGNOSIS — I129 Hypertensive chronic kidney disease with stage 1 through stage 4 chronic kidney disease, or unspecified chronic kidney disease: Secondary | ICD-10-CM | POA: Insufficient documentation

## 2024-02-25 ENCOUNTER — Telehealth: Payer: Self-pay | Admitting: Licensed Clinical Social Worker

## 2024-04-16 ENCOUNTER — Ambulatory Visit: Payer: Self-pay | Admitting: Medical

## 2024-05-08 ENCOUNTER — Ambulatory Visit: Admitting: "Endocrinology
